# Patient Record
Sex: Female | Born: 1935
Health system: Southern US, Community
[De-identification: ages and names within clinical notes are randomized; demographics above are authoritative.]

## PROBLEM LIST (undated history)

## (undated) DIAGNOSIS — B351 Tinea unguium: Secondary | ICD-10-CM

## (undated) DIAGNOSIS — J45909 Unspecified asthma, uncomplicated: Secondary | ICD-10-CM

## (undated) DIAGNOSIS — T8859XA Other complications of anesthesia, initial encounter: Secondary | ICD-10-CM

## (undated) DIAGNOSIS — M1990 Unspecified osteoarthritis, unspecified site: Secondary | ICD-10-CM

## (undated) DIAGNOSIS — L570 Actinic keratosis: Secondary | ICD-10-CM

## (undated) DIAGNOSIS — Z8619 Personal history of other infectious and parasitic diseases: Secondary | ICD-10-CM

## (undated) DIAGNOSIS — I10 Essential (primary) hypertension: Secondary | ICD-10-CM

## (undated) DIAGNOSIS — R002 Palpitations: Secondary | ICD-10-CM

## (undated) DIAGNOSIS — M810 Age-related osteoporosis without current pathological fracture: Secondary | ICD-10-CM

## (undated) HISTORY — DX: Unspecified asthma, uncomplicated: J45.909

## (undated) HISTORY — DX: Personal history of other infectious and parasitic diseases: Z86.19

## (undated) HISTORY — DX: Actinic keratosis: L57.0

## (undated) HISTORY — DX: Age-related osteoporosis without current pathological fracture: M81.0

## (undated) HISTORY — DX: Tinea unguium: B35.1

## (undated) HISTORY — PX: COLONOSCOPY: SHX174

## (undated) HISTORY — DX: Essential (primary) hypertension: I10

## (undated) HISTORY — DX: Palpitations: R00.2

## (undated) HISTORY — DX: Unspecified osteoarthritis, unspecified site: M19.90

---

## 1999-06-21 ENCOUNTER — Other Ambulatory Visit: Admission: RE | Admit: 1999-06-21 | Discharge: 1999-06-21 | Payer: Self-pay | Admitting: Family Medicine

## 2000-04-12 ENCOUNTER — Encounter: Payer: Self-pay | Admitting: Family Medicine

## 2000-04-12 ENCOUNTER — Encounter: Admission: RE | Admit: 2000-04-12 | Discharge: 2000-04-12 | Payer: Self-pay | Admitting: Family Medicine

## 2001-03-05 ENCOUNTER — Other Ambulatory Visit: Admission: RE | Admit: 2001-03-05 | Discharge: 2001-03-05 | Payer: Self-pay | Admitting: Family Medicine

## 2001-05-30 ENCOUNTER — Encounter: Payer: Self-pay | Admitting: Family Medicine

## 2001-05-30 ENCOUNTER — Encounter: Admission: RE | Admit: 2001-05-30 | Discharge: 2001-05-30 | Payer: Self-pay | Admitting: Family Medicine

## 2002-04-17 ENCOUNTER — Other Ambulatory Visit: Admission: RE | Admit: 2002-04-17 | Discharge: 2002-04-17 | Payer: Self-pay | Admitting: Family Medicine

## 2002-06-04 ENCOUNTER — Encounter: Payer: Self-pay | Admitting: Family Medicine

## 2002-06-04 ENCOUNTER — Encounter: Admission: RE | Admit: 2002-06-04 | Discharge: 2002-06-04 | Payer: Self-pay | Admitting: Family Medicine

## 2002-08-01 HISTORY — PX: BREAST EXCISIONAL BIOPSY: SUR124

## 2003-06-27 ENCOUNTER — Encounter: Admission: RE | Admit: 2003-06-27 | Discharge: 2003-06-27 | Payer: Self-pay | Admitting: Family Medicine

## 2004-06-14 ENCOUNTER — Ambulatory Visit: Payer: Self-pay | Admitting: Family Medicine

## 2004-08-05 ENCOUNTER — Encounter: Admission: RE | Admit: 2004-08-05 | Discharge: 2004-08-05 | Payer: Self-pay | Admitting: Family Medicine

## 2005-06-13 ENCOUNTER — Ambulatory Visit: Payer: Self-pay | Admitting: Family Medicine

## 2005-06-20 ENCOUNTER — Ambulatory Visit: Payer: Self-pay | Admitting: Family Medicine

## 2005-07-01 ENCOUNTER — Encounter: Admission: RE | Admit: 2005-07-01 | Discharge: 2005-07-01 | Payer: Self-pay | Admitting: Family Medicine

## 2005-09-07 ENCOUNTER — Encounter: Admission: RE | Admit: 2005-09-07 | Discharge: 2005-09-07 | Payer: Self-pay | Admitting: Family Medicine

## 2006-06-08 ENCOUNTER — Ambulatory Visit: Payer: Self-pay | Admitting: Family Medicine

## 2006-08-07 ENCOUNTER — Ambulatory Visit: Payer: Self-pay | Admitting: Family Medicine

## 2006-08-07 LAB — CONVERTED CEMR LAB
ALT: 33 units/L (ref 0–40)
AST: 32 units/L (ref 0–37)
Alkaline Phosphatase: 69 units/L (ref 39–117)
BUN: 11 mg/dL (ref 6–23)
Basophils Absolute: 0.1 10*3/uL (ref 0.0–0.1)
Basophils Relative: 1 % (ref 0.0–1.0)
Calcium: 9.4 mg/dL (ref 8.4–10.5)
Chol/HDL Ratio, serum: 3.1
Cholesterol: 231 mg/dL (ref 0–200)
Creatinine, Ser: 0.9 mg/dL (ref 0.4–1.2)
Eosinophil percent: 4 % (ref 0.0–5.0)
Glucose, Bld: 98 mg/dL (ref 70–99)
HCT: 41.7 % (ref 36.0–46.0)
HDL: 73.6 mg/dL (ref >39.0)
Hemoglobin: 13.9 g/dL (ref 12.0–15.0)
LDL DIRECT: 116.7 mg/dL
Lymphocytes Relative: 38.6 % (ref 12.0–46.0)
MCHC: 33.3 g/dL (ref 30.0–36.0)
MCV: 93.9 fL (ref 78.0–100.0)
Monocytes Absolute: 0.5 10*3/uL (ref 0.2–0.7)
Monocytes Relative: 8.7 % (ref 3.0–11.0)
Neutro Abs: 2.7 10*3/uL (ref 1.4–7.7)
Neutrophils Relative %: 47.7 % (ref 43.0–77.0)
Platelets: 245 10*3/uL (ref 150–400)
Potassium: 4.1 meq/L (ref 3.5–5.1)
RBC: 4.44 M/uL (ref 3.87–5.11)
RDW: 12.4 % (ref 11.5–14.6)
Sodium: 143 meq/L (ref 135–145)
TSH: 4.28 microintl units/mL (ref 0.35–5.50)
Triglyceride fasting, serum: 136 mg/dL (ref 0–149)
VLDL: 27 mg/dL (ref 0–40)
WBC: 5.7 10*3/uL (ref 4.5–10.5)

## 2006-08-14 ENCOUNTER — Ambulatory Visit: Payer: Self-pay | Admitting: Internal Medicine

## 2006-09-08 ENCOUNTER — Encounter: Admission: RE | Admit: 2006-09-08 | Discharge: 2006-09-08 | Payer: Self-pay | Admitting: Family Medicine

## 2007-05-28 DIAGNOSIS — I10 Essential (primary) hypertension: Secondary | ICD-10-CM | POA: Insufficient documentation

## 2007-05-28 DIAGNOSIS — M810 Age-related osteoporosis without current pathological fracture: Secondary | ICD-10-CM | POA: Insufficient documentation

## 2007-07-06 ENCOUNTER — Ambulatory Visit: Payer: Self-pay | Admitting: Family Medicine

## 2007-09-11 ENCOUNTER — Encounter: Admission: RE | Admit: 2007-09-11 | Discharge: 2007-09-11 | Payer: Self-pay | Admitting: Family Medicine

## 2007-09-17 ENCOUNTER — Ambulatory Visit: Payer: Self-pay | Admitting: Family Medicine

## 2007-09-17 DIAGNOSIS — R002 Palpitations: Secondary | ICD-10-CM | POA: Insufficient documentation

## 2007-09-17 LAB — CONVERTED CEMR LAB
ALT: 24 units/L (ref 0–35)
AST: 26 units/L (ref 0–37)
Albumin: 3.9 g/dL (ref 3.5–5.2)
Alkaline Phosphatase: 60 units/L (ref 39–117)
BUN: 15 mg/dL (ref 6–23)
Basophils Absolute: 0.1 10*3/uL (ref 0.0–0.1)
Basophils Relative: 0.9 % (ref 0.0–1.0)
Bilirubin Urine: NEGATIVE
Bilirubin, Direct: 0.1 mg/dL (ref 0.0–0.3)
Blood in Urine, dipstick: NEGATIVE
CO2: 29 meq/L (ref 19–32)
Calcium: 9.8 mg/dL (ref 8.4–10.5)
Chloride: 109 meq/L (ref 96–112)
Cholesterol: 210 mg/dL (ref 0–200)
Creatinine, Ser: 0.9 mg/dL (ref 0.4–1.2)
Direct LDL: 107.4 mg/dL
Eosinophils Absolute: 0.2 10*3/uL (ref 0.0–0.6)
Eosinophils Relative: 3.2 % (ref 0.0–5.0)
GFR calc Af Amer: 78 mL/min
GFR calc non Af Amer: 65 mL/min
Glucose, Bld: 105 mg/dL — ABNORMAL HIGH (ref 70–99)
Glucose, Urine, Semiquant: NEGATIVE
HCT: 40.6 % (ref 36.0–46.0)
HDL: 73.6 mg/dL (ref >39.0)
Hemoglobin: 13.5 g/dL (ref 12.0–15.0)
Ketones, urine, test strip: NEGATIVE
Lymphocytes Relative: 34.1 % (ref 12.0–46.0)
MCHC: 33.3 g/dL (ref 30.0–36.0)
MCV: 92.6 fL (ref 78.0–100.0)
Monocytes Absolute: 0.5 10*3/uL (ref 0.2–0.7)
Monocytes Relative: 9.1 % (ref 3.0–11.0)
Neutro Abs: 3 10*3/uL (ref 1.4–7.7)
Neutrophils Relative %: 52.7 % (ref 43.0–77.0)
Nitrite: NEGATIVE
Platelets: 191 10*3/uL (ref 150–400)
Potassium: 4.6 meq/L (ref 3.5–5.1)
Protein, U semiquant: NEGATIVE
RBC: 4.38 M/uL (ref 3.87–5.11)
RDW: 12.2 % (ref 11.5–14.6)
Sodium: 146 meq/L — ABNORMAL HIGH (ref 135–145)
Specific Gravity, Urine: 1.02
TSH: 3.48 microintl units/mL (ref 0.35–5.50)
Total Bilirubin: 1.3 mg/dL — ABNORMAL HIGH (ref 0.3–1.2)
Total CHOL/HDL Ratio: 2.9
Total Protein: 6.8 g/dL (ref 6.0–8.3)
Triglycerides: 103 mg/dL (ref 0–149)
Urobilinogen, UA: 0.2
VLDL: 21 mg/dL (ref 0–40)
WBC Urine, dipstick: NEGATIVE
WBC: 5.8 10*3/uL (ref 4.5–10.5)
pH: 5.5

## 2008-05-21 ENCOUNTER — Ambulatory Visit: Payer: Self-pay | Admitting: Family Medicine

## 2008-09-10 ENCOUNTER — Ambulatory Visit: Payer: Self-pay | Admitting: Internal Medicine

## 2008-09-22 ENCOUNTER — Ambulatory Visit: Payer: Self-pay | Admitting: Family Medicine

## 2008-09-23 LAB — CONVERTED CEMR LAB
ALT: 23 units/L (ref 0–35)
AST: 30 units/L (ref 0–37)
Albumin: 3.8 g/dL (ref 3.5–5.2)
Alkaline Phosphatase: 67 units/L (ref 39–117)
BUN: 17 mg/dL (ref 6–23)
Basophils Absolute: 0 10*3/uL (ref 0.0–0.1)
Basophils Relative: 0.8 % (ref 0.0–3.0)
Bilirubin, Direct: 0.2 mg/dL (ref 0.0–0.3)
CO2: 28 meq/L (ref 19–32)
Calcium: 9 mg/dL (ref 8.4–10.5)
Chloride: 106 meq/L (ref 96–112)
Creatinine, Ser: 0.8 mg/dL (ref 0.4–1.2)
Eosinophils Absolute: 0.2 10*3/uL (ref 0.0–0.7)
Eosinophils Relative: 3.4 % (ref 0.0–5.0)
GFR calc Af Amer: 89 mL/min
GFR calc non Af Amer: 74 mL/min
Glucose, Bld: 100 mg/dL — ABNORMAL HIGH (ref 70–99)
HCT: 39.1 % (ref 36.0–46.0)
Hemoglobin: 13.4 g/dL (ref 12.0–15.0)
Lymphocytes Relative: 37.3 % (ref 12.0–46.0)
MCHC: 34.2 g/dL (ref 30.0–36.0)
MCV: 92.9 fL (ref 78.0–100.0)
Monocytes Absolute: 0.5 10*3/uL (ref 0.1–1.0)
Monocytes Relative: 9.7 % (ref 3.0–12.0)
Neutro Abs: 2.6 10*3/uL (ref 1.4–7.7)
Neutrophils Relative %: 48.8 % (ref 43.0–77.0)
Platelets: 217 10*3/uL (ref 150–400)
Potassium: 4.3 meq/L (ref 3.5–5.1)
RBC: 4.21 M/uL (ref 3.87–5.11)
RDW: 12.3 % (ref 11.5–14.6)
Sodium: 143 meq/L (ref 135–145)
TSH: 3.67 microintl units/mL (ref 0.35–5.50)
Total Bilirubin: 1.2 mg/dL (ref 0.3–1.2)
Total Protein: 6.9 g/dL (ref 6.0–8.3)
WBC: 5.3 10*3/uL (ref 4.5–10.5)

## 2008-09-25 ENCOUNTER — Encounter: Payer: Self-pay | Admitting: Family Medicine

## 2008-09-25 ENCOUNTER — Telehealth: Payer: Self-pay | Admitting: Family Medicine

## 2008-09-25 ENCOUNTER — Ambulatory Visit: Payer: Self-pay | Admitting: Family Medicine

## 2008-09-30 ENCOUNTER — Encounter: Admission: RE | Admit: 2008-09-30 | Discharge: 2008-09-30 | Payer: Self-pay | Admitting: Family Medicine

## 2008-09-30 DIAGNOSIS — B351 Tinea unguium: Secondary | ICD-10-CM | POA: Insufficient documentation

## 2008-10-20 LAB — CONVERTED CEMR LAB
Bilirubin Urine: NEGATIVE
Blood in Urine, dipstick: NEGATIVE
Glucose, Urine, Semiquant: NEGATIVE
Ketones, urine, test strip: NEGATIVE
Nitrite: NEGATIVE
Protein, U semiquant: NEGATIVE
Specific Gravity, Urine: 1.02
Urobilinogen, UA: 0.2
pH: 5.5

## 2009-08-04 ENCOUNTER — Telehealth: Payer: Self-pay | Admitting: *Deleted

## 2009-09-23 ENCOUNTER — Ambulatory Visit: Payer: Self-pay | Admitting: Family Medicine

## 2009-09-23 LAB — CONVERTED CEMR LAB
Bilirubin Urine: NEGATIVE
Blood in Urine, dipstick: NEGATIVE
Glucose, Urine, Semiquant: NEGATIVE
Ketones, urine, test strip: NEGATIVE
Nitrite: NEGATIVE
Protein, U semiquant: NEGATIVE
Specific Gravity, Urine: 1.02
Urobilinogen, UA: 0.2
pH: 5

## 2009-09-28 LAB — CONVERTED CEMR LAB
ALT: 32 units/L (ref 0–35)
AST: 32 units/L (ref 0–37)
Albumin: 3.9 g/dL (ref 3.5–5.2)
Alkaline Phosphatase: 65 units/L (ref 39–117)
BUN: 15 mg/dL (ref 6–23)
Basophils Absolute: 0.1 10*3/uL (ref 0.0–0.1)
Basophils Relative: 1.1 % (ref 0.0–3.0)
Bilirubin, Direct: 0.2 mg/dL (ref 0.0–0.3)
CO2: 30 meq/L (ref 19–32)
Calcium: 9.3 mg/dL (ref 8.4–10.5)
Chloride: 109 meq/L (ref 96–112)
Creatinine, Ser: 0.9 mg/dL (ref 0.4–1.2)
Eosinophils Absolute: 0.3 10*3/uL (ref 0.0–0.7)
Eosinophils Relative: 4.1 % (ref 0.0–5.0)
GFR calc non Af Amer: 64.26 mL/min (ref >60)
Glucose, Bld: 87 mg/dL (ref 70–99)
HCT: 39.1 % (ref 36.0–46.0)
Hemoglobin: 13.1 g/dL (ref 12.0–15.0)
Lymphocytes Relative: 32.4 % (ref 12.0–46.0)
Lymphs Abs: 2 10*3/uL (ref 0.7–4.0)
MCHC: 33.4 g/dL (ref 30.0–36.0)
MCV: 95.8 fL (ref 78.0–100.0)
Monocytes Absolute: 0.6 10*3/uL (ref 0.1–1.0)
Monocytes Relative: 9.6 % (ref 3.0–12.0)
Neutro Abs: 3.1 10*3/uL (ref 1.4–7.7)
Neutrophils Relative %: 52.8 % (ref 43.0–77.0)
Platelets: 197 10*3/uL (ref 150.0–400.0)
Potassium: 4.4 meq/L (ref 3.5–5.1)
RBC: 4.08 M/uL (ref 3.87–5.11)
RDW: 12.2 % (ref 11.5–14.6)
Sodium: 145 meq/L (ref 135–145)
TSH: 3.57 microintl units/mL (ref 0.35–5.50)
Total Bilirubin: 0.9 mg/dL (ref 0.3–1.2)
Total Protein: 7.1 g/dL (ref 6.0–8.3)
WBC: 6.1 10*3/uL (ref 4.5–10.5)

## 2009-10-01 ENCOUNTER — Encounter: Admission: RE | Admit: 2009-10-01 | Discharge: 2009-10-01 | Payer: Self-pay | Admitting: Family Medicine

## 2009-10-01 ENCOUNTER — Ambulatory Visit: Payer: Self-pay | Admitting: Family Medicine

## 2009-10-02 DIAGNOSIS — J45991 Cough variant asthma: Secondary | ICD-10-CM | POA: Insufficient documentation

## 2009-10-05 ENCOUNTER — Ambulatory Visit: Payer: Self-pay | Admitting: Family Medicine

## 2009-10-07 DIAGNOSIS — L570 Actinic keratosis: Secondary | ICD-10-CM | POA: Insufficient documentation

## 2009-10-22 ENCOUNTER — Telehealth: Payer: Self-pay | Admitting: Family Medicine

## 2010-05-04 ENCOUNTER — Ambulatory Visit: Payer: Self-pay | Admitting: Family Medicine

## 2010-09-02 NOTE — Assessment & Plan Note (Signed)
Summary: toenail removal - rv  Run at a melanoma, and a benign tumor, but it was not known.  No  Procedure Note Last Tetanus: Historical (08/01/2005)  Nail Treatment: Indication: black spot  Procedure # 1: nail bed removal    Region: l grt toenail    Technique: #15 blade w/scissors & forceps    Anesthesia: 1% lidocaine w/o epinephrine  Cleaned and prepped with: betadine Wound dressing: neosporin Additional Instructions: elevation and ice tonight.  Remove the dressing tomorrow.  Clean it with warm water and peroxide to get off the dried blood.  Apply antibiotic ointment and Band-Aid.   Chief Complaint:  black lesion and under left great toe nail.  History of Present Illness: Taylor Blankenship is a 77 year old female, retired Engineer, civil (consulting), who comes in today for evaluation of a black lesion under her left great toenail.  She first noticed this about a year ago.  Over the past 12 months.  Its gotten darker and bigger.  She was advised to return for removal and she is here today.    Current Allergies: ! AMOXICILLIN ! KEFLEX  Past Medical History:    Reviewed history from 05/28/2007 and no changes required:       PMS       DJD R Knee       Hypertension       Osteoporosis       Shingles   Social History:    Reviewed history from 05/28/2007 and no changes required:       Retired Charity fundraiser        Married       Never Smoked       Alcohol use-yes       Regular exercise-yes     Physical Exam  General:     Well-developed,well-nourished,in no acute distress; alert,appropriate and cooperative throughout examination    Complete Medication List: 1)  Atenolol 25 Mg Tabs (Atenolol) .... Take 1 tablet by mouth every morning 2)  Adult Aspirin Low Strength 81 Mg Tbdp (Aspirin) 3)  Multivitamins Caps (Multiple vitamin) .... One by mouth daily 4)  Calcium 600 1500 Mg Tabs (Calcium carbonate) .... As directed 5)  Fish Oil 300 Mg Caps (Omega-3 fatty acids) .... As directed. 6)  D 1000 Plus Tabs  (Fa-cyanocobalamin-b6-d-ca) .... Once daily

## 2010-09-02 NOTE — Assessment & Plan Note (Signed)
Summary: emp-will fast//ccm/pt rsc from bmp/per rachel/cjr   Vital Signs:  Patient profile:   77 year old female Height:      63.5 inches Weight:      184 pounds BMI:     32.20 Temp:     98.0 degrees F oral BP sitting:   134 / 90  (left arm) Cuff size:   regular  Vitals Entered By: Kern Reap CMA [name]) (September 23, 2009 10:29 AM)  Reason for Visit cpx  History of Present Illness: Wendell is a 77 year old, married female, nonsmoker, who comes in today for evaluation of hypertension.  She takes atenolol 25 mg daily BP 134/90.  Jomarie Longs takes an aspirin tablet daily, calcium, vitamin D.  She gets routine eye care, dental care, does not do BSE monthly, but does get annual mammography.  Colonoscopy normal in GI.  Tetanus 2007 next 2010 solution not declined.  Review of systems otherwise negative except for profound hearing loss.  She forgot her hearing aids today also, as lesion right anterior thigh that won't heal  Allergies: 1)  ! Amoxicillin 2)  ! Keflex  Past History:  Past medical, surgical, family and social histories (including risk factors) reviewed, and no changes noted (except as noted below).  Past Medical History: Reviewed history from 05/28/2007 and no changes required. PMS DJD R Knee Hypertension Osteoporosis Shingles  Past Surgical History: Reviewed history from 05/28/2007 and no changes required. Colonoscopy CB x2  Family History: Reviewed history from 05/28/2007 and no changes required. Family History of Arthritis Family History of CAD Female 1st degree relative <50 Family History of Colon CA 1st degree relative <60 Family History Diabetes 1st degree relative Family History of Endocrine disorder Family History of Respiratory disease  Social History: Reviewed history from 09/25/2008 and no changes required. Retired Charity fundraiser  Married Never Smoked Alcohol use-yes Regular exercise-yes  Review of Systems      See HPI  Physical Exam  General:   Well-developed,well-nourished,in no acute distress; alert,appropriate and cooperative throughout examination Head:  Normocephalic and atraumatic without obvious abnormalities. No apparent alopecia or balding. Eyes:  No corneal or conjunctival inflammation noted. EOMI. Perrla. Funduscopic exam benign, without hemorrhages, exudates or papilledema. Vision grossly normal. Ears:  External ear exam shows no significant lesions or deformities.  Otoscopic examination reveals clear canals, tympanic membranes are intact bilaterally without bulging, retraction, inflammation or discharge. Hearing is grossly normal bilaterally. Nose:  External nasal examination shows no deformity or inflammation. Nasal mucosa are pink and moist without lesions or exudates. Mouth:  Oral mucosa and oropharynx without lesions or exudates.  Teeth in good repair. Neck:  No deformities, masses, or tenderness noted. Chest Wall:  No deformities, masses, or tenderness noted. Breasts:  No mass, nodules, thickening, tenderness, bulging, retraction, inflamation, nipple discharge or skin changes noted.   Lungs:  Normal respiratory effort, chest expands symmetrically. Lungs are clear to auscultation, no crackles or wheezes. Heart:  Normal rate and regular rhythm. S1 and S2 normal without gallop, murmur, click, rub or other extra sounds. Abdomen:  Bowel sounds positive,abdomen soft and non-tender without masses, organomegaly or hernias noted. Rectal:  No external abnormalities noted. Normal sphincter tone. No rectal masses or tenderness. Msk:  No deformity or scoliosis noted of thoracic or lumbar spine.   Pulses:  R and L carotid,radial,femoral,dorsalis pedis and posterior tibial pulses are full and equal bilaterally Extremities:  No clubbing, cyanosis, edema, or deformity noted with normal full range of motion of all joints.   Neurologic:  No  cranial nerve deficits noted. Station and gait are normal. Plantar reflexes are down-going  bilaterally. DTRs are symmetrical throughout. Sensory, motor and coordinative functions appear intact. Skin:  Intact without suspicious lesions or rashes...Marland Kitchenexcept for nonhealing ulcerated lesion, right anterior thigh.  Advise return ASAP for removal Cervical Nodes:  No lymphadenopathy noted Axillary Nodes:  No palpable lymphadenopathy Inguinal Nodes:  No significant adenopathy Psych:  Cognition and judgment appear intact. Alert and cooperative with normal attention span and concentration. No apparent delusions, illusions, hallucinations   Impression & Recommendations:  Problem # 1:  PALPITATIONS, RECURRENT (ICD-785.1) Assessment Improved  Her updated medication list for this problem includes:    Atenolol 25 Mg Tabs (Atenolol) .Marland Kitchen... Take 1 tablet by mouth every morning  Orders: Venipuncture (16109) TLB-BMP (Basic Metabolic Panel-BMET) (80048-METABOL) TLB-CBC Platelet - w/Differential (85025-CBCD) TLB-Hepatic/Liver Function Pnl (80076-HEPATIC) TLB-TSH (Thyroid Stimulating Hormone) (60454-UJW) Prescription Created Electronically [phone])  Problem # 2:  HYPERTENSION (ICD-401.9) Assessment: Improved  Her updated medication list for this problem includes:    Atenolol 25 Mg Tabs (Atenolol) .Marland Kitchen... Take 1 tablet by mouth every morning  Orders: Venipuncture (78295) TLB-BMP (Basic Metabolic Panel-BMET) (80048-METABOL) TLB-CBC Platelet - w/Differential (85025-CBCD) TLB-Hepatic/Liver Function Pnl (80076-HEPATIC) TLB-TSH (Thyroid Stimulating Hormone) (62130-QMV) Prescription Created Electronically [phone]) UA Dipstick w/o Micro (automated)  (81003)  Complete Medication List: 1)  Atenolol 25 Mg Tabs (Atenolol) .... Take 1 tablet by mouth every morning 2)  Adult Aspirin Low Strength 81 Mg Tbdp (Aspirin) 3)  Multivitamins Caps (Multiple vitamin) .... One by mouth daily 4)  Calcium 600 1500 Mg Tabs (Calcium carbonate) .... As directed 5)  Fish Oil 300 Mg Caps (Omega-3 fatty acids) .... As  directed. 6)  D 1000 Plus Tabs (Fa-cyanocobalamin-b6-d-ca) .... Once daily  Patient Instructions: 1)  return sometime in the next week for mole removal 2)  Please schedule a follow-up appointment in 1 year. 3)  It is important that you exercise regularly at least 20 minutes 5 times a week. If you develop chest pain, have severe difficulty breathing, or feel very tired , stop exercising immediately and seek medical attention. 4)  Schedule your mammogram. 5)  Take calcium +Vitamin D daily. 6)  Take an Aspirin every day. Prescriptions: ATENOLOL 25 MG  TABS (ATENOLOL) Take 1 tablet by mouth every morning  #100 x 4   Entered and Authorized by:   Roderick Pee MD   Signed by:   Roderick Pee MD on 09/23/2009   Method used:   Print then Give to Patient   RxID:   [id number] ATENOLOL 25 MG  TABS (ATENOLOL) Take 1 tablet by mouth every morning  #100 x 4   Entered and Authorized by:   Roderick Pee MD   Signed by:   Roderick Pee MD on 09/23/2009   Method used:   Electronically to        [facility]  [phone]* (retail)       [address] [place], [place]  [address]       Ph: [phone] or [phone]       Fax: [phone]   RxID:   [id number]     Laboratory Results   Urine Tests    Routine Urinalysis   Color: yellow Appearance: Clear Glucose: negative   (Normal Range: Negative) Bilirubin: negative   (Normal Range: Negative) Ketone: negative   (Normal Range: Negative) Spec. Gravity: 1.020   (Normal Range: 1.003-1.035) Blood: negative   (Normal Range: Negative)  pH: 5.0   (Normal Range: 5.0-8.0) Protein: negative   (Normal Range: Negative) Urobilinogen: 0.2   (Normal Range: 0-1) Nitrite: negative   (Normal Range: Negative) Leukocyte Esterace: trace   (Normal Range: Negative)    Comments: Rita Ohara  September 23, 2009 11:47 AM

## 2010-09-02 NOTE — Assessment & Plan Note (Signed)
Summary: FLU SHOT W/ANGELA/CCM  Nurse Visit    Prior Medications: ATENOLOL 25 MG  TABS (ATENOLOL)  ADULT ASPIRIN LOW STRENGTH 81 MG  TBDP (ASPIRIN)  Current Allergies: ! AMOXICILLIN ! KEFLEX   Influenza Vaccine    Vaccine Type: Fluvax MCR    Given by: Arcola Jansky, RN  Flu Vaccine Consent Questions    Do you have a history of severe allergic reactions to this vaccine? no    Any prior history of allergic reactions to egg and/or gelatin? no    Do you have a sensitivity to the preservative Thimersol? no    Do you have a past history of Guillan-Barre Syndrome? no    Do you currently have an acute febrile illness? no    Have you ever had a severe reaction to latex? no    Vaccine information given and explained to patient? yes    Are you currently pregnant? no   Impression & Recommendations:  Problem # 1:  Preventive Health Care (ICD-V70.0) lot [id number], EXP 30 jun 09, sanofi pasteur left deltoid IM, 0.5 cc.   Complete Medication List: 1)  Atenolol 25 Mg Tabs (Atenolol) 2)  Adult Aspirin Low Strength 81 Mg Tbdp (Aspirin)  Other Orders: Influenza Vaccine MCR (47829)   Orders Added: 1)  Influenza Vaccine MCR [00025]    ]

## 2010-09-02 NOTE — Progress Notes (Signed)
Summary: labs  Phone Note Call from Patient   Caller: Patient Details for Reason: LABS Summary of Call: pt did not receive a lipid or UA should patient return for this? Initial call taken by: Kern Reap CMA,  September 25, 2008 5:33 PM  Follow-up for Phone Call        called left message that she does not require a lipid panel since her other lipids of all been normal.  But she did need to get a UA come in in 3 weeks bringing morning.  Urine specimen did that for free Follow-up by: Roderick Pee MD,  September 30, 2008 6:02 PM

## 2010-09-02 NOTE — Progress Notes (Signed)
Summary: biopsy report  Phone Note Call from Patient Call back at Home Phone [phone]   Reason for Call: Lab or Test Results Summary of Call: Wants to hear biopsy report from a couple weeks ago.  Next week will be fine. Initial call taken by: Rudy Jew, RN,  October 22, 2009 11:26 AM  Follow-up for Phone Call        please call.......... lesion actinic keratosis........Marland Kitchen benign Follow-up by: Roderick Pee MD,  October 26, 2009 8:40 AM  Additional Follow-up for Phone Call Additional follow up Details #1::        Phone Call Completed Additional Follow-up by: Kern Reap CMA [name]),  October 26, 2009 11:07 AM

## 2010-09-02 NOTE — Assessment & Plan Note (Signed)
Summary: EMP/WILL FAST/CCM   Vital Signs:  Patient Profile:   77 Years Old Female Height:     64 inches Weight:      180 pounds Temp:     98.2 degrees F oral Pulse rate:   78 / minute Pulse rhythm:   irregular Resp:     12 per minute BP sitting:   120 / 76  Vitals Entered By: Lynann Beaver CMA (September 17, 2007 10:29 AM)                 Chief Complaint:  cpx.  History of Present Illness: Taylor Blankenship is a 77 year old, married female, who comes in today for evaluation of hypertension, osteoporosis, postmenopausal, dry vagina, and degenerative joint disease, most prominent in her right knee. she also complains of skipping of her heart rate.  She's had it for a couple years now.  She takes atenolol 25 mg one half tablet daily.  She does not drink any caffeine.  She is noticed in the evening, when she's resting.  It doesn't bother when she walks.  She has had chest pain, shortness of breath, lightheadedness, et Karie Soda.  Her previous EKGs have all been normal, as was today.  The osteoporosis is treated with calcium, vitamin D, and exercise.  She declines Fosamax.  The vaginal dryness is treated with hormonal cream however, she didn't use it.  The knee pain is treated with exercise and Motrin.  She is doing well and has no major complaints.     Current Allergies: ! AMOXICILLIN ! KEFLEX  Past Medical History:    Reviewed history from 05/28/2007 and no changes required:       PMS       DJD R Knee       Hypertension       Osteoporosis       Shingles   Family History:    Reviewed history from 05/28/2007 and no changes required:       Family History of Arthritis       Family History of CAD Female 1st degree relative <50       Family History of Colon CA 1st degree relative <60       Family History Diabetes 1st degree relative       Family History of Endocrine disorder       Family History of Respiratory disease  Social History:    Reviewed history from 05/28/2007 and no changes  required:       Retired       Married       Never Smoked       Alcohol use-yes       Regular exercise-yes    Review of Systems      See HPI   Physical Exam  General:     Well-developed,well-nourished,in no acute distress; alert,appropriate and cooperative throughout examination Head:     Normocephalic and atraumatic without obvious abnormalities. No apparent alopecia or balding. Eyes:     No corneal or conjunctival inflammation noted. EOMI. Perrla. Funduscopic exam benign, without hemorrhages, exudates or papilledema. Vision grossly normal. Ears:     External ear exam shows no significant lesions or deformities.  Otoscopic examination reveals clear canals, tympanic membranes are intact bilaterally without bulging, retraction, inflammation or discharge. Hearing is grossly normal bilaterally. Nose:     External nasal examination shows no deformity or inflammation. Nasal mucosa are pink and moist without lesions or exudates. Mouth:     Oral mucosa and oropharynx  without lesions or exudates.  Teeth in good repair. Neck:     No deformities, masses, or tenderness noted. Chest Wall:     No deformities, masses, or tenderness noted. Breasts:     No mass, nodules, thickening, tenderness, bulging, retraction, inflamation, nipple discharge or skin changes noted.   Lungs:     Normal respiratory effort, chest expands symmetrically. Lungs are clear to auscultation, no crackles or wheezes. Heart:     Normal rate and regular rhythm. S1 and S2 normal without gallop, murmur, click, rub or other extra sounds. Abdomen:     Bowel sounds positive,abdomen soft and non-tender without masses, organomegaly or hernias noted. Rectal:     No external abnormalities noted. Normal sphincter tone. No rectal masses or tenderness. Genitalia:     Pelvic Exam:        External: normal female genitalia without lesions or masses        Vagina: normal without lesions or masses        Cervix: normal without  lesions or masses        Adnexa: normal bimanual exam without masses or fullness        Uterus: normal by palpation        Pap smear: performed Msk:     No deformity or scoliosis noted of thoracic or lumbar spine.   Pulses:     R and L carotid,radial,femoral,dorsalis pedis and posterior tibial pulses are full and equal bilaterally Extremities:     No clubbing, cyanosis, edema, or deformity noted with normal full range of motion of all joints.   Neurologic:     No cranial nerve deficits noted. Station and gait are normal. Plantar reflexes are down-going bilaterally. DTRs are symmetrical throughout. Sensory, motor and coordinative functions appear intact. Skin:     Intact without suspicious lesions or rashes Cervical Nodes:     No lymphadenopathy noted Axillary Nodes:     No palpable lymphadenopathy Inguinal Nodes:     No significant adenopathy Psych:     Cognition and judgment appear intact. Alert and cooperative with normal attention span and concentration. No apparent delusions, illusions, hallucinations    Impression & Recommendations:  Problem # 1:  PHYSICAL EXAMINATION (ICD-V70.0) Assessment: Unchanged  Orders: EKG w/ Interpretation (93000) Venipuncture (13244) TLB-Lipid Panel (80061-LIPID) TLB-BMP (Basic Metabolic Panel-BMET) (80048-METABOL) TLB-TSH (Thyroid Stimulating Hormone) (84443-TSH) TLB-CBC Platelet - w/Differential (85025-CBCD) TLB-Hepatic/Liver Function Pnl (80076-HEPATIC)   Problem # 2:  HYPERTENSION (ICD-401.9) Assessment: Unchanged  Her updated medication list for this problem includes:    Atenolol 25 Mg Tabs (Atenolol) .Marland Kitchen... Take 1 tablet by mouth every morning  Orders: EKG w/ Interpretation (93000) UA Dipstick w/o Micro (81002) Venipuncture (01027) TLB-Lipid Panel (80061-LIPID) TLB-BMP (Basic Metabolic Panel-BMET) (80048-METABOL) TLB-TSH (Thyroid Stimulating Hormone) (84443-TSH) TLB-CBC Platelet - w/Differential (85025-CBCD) TLB-Hepatic/Liver  Function Pnl (80076-HEPATIC)   Problem # 3:  PALPITATIONS, RECURRENT (ICD-785.1) Assessment: Deteriorated  Her updated medication list for this problem includes:    Atenolol 25 Mg Tabs (Atenolol) .Marland Kitchen... Take 1 tablet by mouth every morning  Orders: Venipuncture (25366) TLB-Lipid Panel (80061-LIPID) TLB-BMP (Basic Metabolic Panel-BMET) (80048-METABOL) TLB-TSH (Thyroid Stimulating Hormone) (84443-TSH) TLB-CBC Platelet - w/Differential (85025-CBCD) TLB-Hepatic/Liver Function Pnl (80076-HEPATIC)   Complete Medication List: 1)  Atenolol 25 Mg Tabs (Atenolol) .... Take 1 tablet by mouth every morning 2)  Adult Aspirin Low Strength 81 Mg Tbdp (Aspirin) 3)  Multivitamins Caps (Multiple vitamin) .... One by mouth daily 4)  Calcium 600 1500 Mg Tabs (Calcium carbonate) .... As directed 5)  Fish Oil 300 Mg Caps (Omega-3 fatty acids) .... As directed.   Patient Instructions: 1)  increase the atenolol to 25 mg daily.  Continue calcium, vitamin D, and walking.  Return in one year for reevaluation    Prescriptions: ATENOLOL 25 MG  TABS (ATENOLOL) Take 1 tablet by mouth every morning  #100 x 4   Entered and Authorized by:   Roderick Pee MD   Signed by:   Roderick Pee MD on 09/17/2007   Method used:   Print then Give to Patient   RxID:   [id number]  ] Laboratory Results   Urine Tests   Date/Time Reported: September 17, 2007 12:43 PM   Routine Urinalysis   Color: yellow Appearance: Clear Glucose: negative   (Normal Range: Negative) Bilirubin: negative   (Normal Range: Negative) Ketone: negative   (Normal Range: Negative) Spec. Gravity: 1.020   (Normal Range: 1.003-1.035) Blood: negative   (Normal Range: Negative) pH: 5.5   (Normal Range: 5.0-8.0) Protein: negative   (Normal Range: Negative) Urobilinogen: 0.2   (Normal Range: 0-1) Nitrite: negative   (Normal Range: Negative) Leukocyte Esterace: negative   (Normal Range: Negative)    Comments:  ..................................................................Marland KitchenWynona Canes, CMA  September 17, 2007 12:43 PM

## 2010-09-02 NOTE — Assessment & Plan Note (Signed)
Summary: emp patient fasting/mhf RESCHEDULE WITH PATIENT/MHF   Vital Signs:  Patient Profile:   77 Years Old Female Height:     63.5 inches Weight:      184 pounds Temp:     98.3 degrees F oral BP sitting:   120 / 78  (left arm)  Vitals Entered By: Kern Reap CMA (September 22, 2008 10:40 AM)                 Chief Complaint:  cpx.  History of Present Illness:  Taylor Blankenship is a 77 year old female, who comes in today for evaluation of hypertension. She takes the atenolol 25 mg one half tablet daily.  BP 120/78, pulse 60 and regular.  She also takes aspirin, calcium, and vitamin D.  She goes to the gym daily for 45 minutes.  Seasonal flu 2009, tetanus, 2007, Pneumovax 2005.  We will give her a Pneumovax booster today.  Also offered shingles and H1 N1, she declined.  A new problem is discoloration of her left great toenail.  She noticed this about a year ago.  It was a black dot.  Over the past years gotten bigger.  We discussed the possible etiologies.  The worse thing could be a melanoma.  There.  Therefore, been asked to come back next week to get removed under local    Updated Prior Medication List: ATENOLOL 25 MG  TABS (ATENOLOL) Take 1 tablet by mouth every morning ADULT ASPIRIN LOW STRENGTH 81 MG  TBDP (ASPIRIN)  MULTIVITAMINS   CAPS (MULTIPLE VITAMIN) one by mouth daily CALCIUM 600 1500 MG  TABS (CALCIUM CARBONATE) as directed FISH OIL 300 MG  CAPS (OMEGA-3 FATTY ACIDS) as directed. D 1000 PLUS  TABS (FA-CYANOCOBALAMIN-B6-D-CA) once daily  Current Allergies (reviewed today): ! AMOXICILLIN ! KEFLEX  Past Medical History:    Reviewed history from 05/28/2007 and no changes required:       PMS       DJD R Knee       Hypertension       Osteoporosis       Shingles   Family History:    Reviewed history from 05/28/2007 and no changes required:       Family History of Arthritis       Family History of CAD Female 1st degree relative <50       Family History of Colon CA  1st degree relative <60       Family History Diabetes 1st degree relative       Family History of Endocrine disorder       Family History of Respiratory disease  Social History:    Reviewed history from 05/28/2007 and no changes required:       Retired       Married       Never Smoked       Alcohol use-yes       Regular exercise-yes    Review of Systems      See HPI   Physical Exam  General:     Well-developed,well-nourished,in no acute distress; alert,appropriate and cooperative throughout examination Head:     Normocephalic and atraumatic without obvious abnormalities. No apparent alopecia or balding. Eyes:     No corneal or conjunctival inflammation noted. EOMI. Perrla. Funduscopic exam benign, without hemorrhages, exudates or papilledema. Vision grossly normal. Ears:     External ear exam shows no significant lesions or deformities.  Otoscopic examination reveals clear canals, tympanic membranes are intact bilaterally without bulging,  retraction, inflammation or discharge. Hearing is grossly normal bilaterally. Nose:     External nasal examination shows no deformity or inflammation. Nasal mucosa are pink and moist without lesions or exudates. Mouth:     Oral mucosa and oropharynx without lesions or exudates.  Teeth in good repair. Neck:     No deformities, masses, or tenderness noted. Chest Wall:     No deformities, masses, or tenderness noted. Breasts:     No mass, nodules, thickening, tenderness, bulging, retraction, inflamation, nipple discharge or skin changes noted.   Lungs:     Normal respiratory effort, chest expands symmetrically. Lungs are clear to auscultation, no crackles or wheezes. Heart:     Normal rate and regular rhythm. S1 and S2 normal without gallop, murmur, click, rub or other extra sounds. Abdomen:     Bowel sounds positive,abdomen soft and non-tender without masses, organomegaly or hernias noted. Rectal:     No external abnormalities noted.  Normal sphincter tone. No rectal masses or tenderness.Brown stool, guaiac-positive.  Hemorrhoid is been bothering her recently colonoscopy of 5 normal Genitalia:     Pelvic Exam:        External: normal female genitalia without lesions or masses        Vagina: normal without lesions or masses        Cervix: normal without lesions or masses        Adnexa: normal bimanual exam without masses or fullness        Uterus: normal by palpation        Pap smear: not performed Msk:     No deformity or scoliosis noted of thoracic or lumbar spine.   Pulses:     R and L carotid,radial,femoral,dorsalis pedis and posterior tibial pulses are full and equal bilaterally Extremities:     No clubbing, cyanosis, edema, or deformity noted with normal full range of motion of all joints.   Neurologic:     No cranial nerve deficits noted. Station and gait are normal. Plantar reflexes are down-going bilaterally. DTRs are symmetrical throughout. Sensory, motor and coordinative functions appear intact. Skin:     multiple seborrheic keratosis Cervical Nodes:     No lymphadenopathy noted Axillary Nodes:     No palpable lymphadenopathy Inguinal Nodes:     No significant adenopathy Psych:     Cognition and judgment appear intact. Alert and cooperative with normal attention span and concentration. No apparent delusions, illusions, hallucinations    Impression & Recommendations:  Problem # 1:  PALPITATIONS, RECURRENT (ICD-785.1) Assessment: Improved  Her updated medication list for this problem includes:    Atenolol 25 Mg Tabs (Atenolol) .Marland Kitchen... Take 1 tablet by mouth every morning  Orders: Venipuncture ([address]) EKG w/ Interpretation (93000) TLB-BMP (Basic Metabolic Panel-BMET) (80048-METABOL) TLB-CBC Platelet - w/Differential (85025-CBCD) TLB-Hepatic/Liver Function Pnl (80076-HEPATIC) TLB-TSH (Thyroid Stimulating Hormone) (84443-TSH)   Problem # 2:  OSTEOPOROSIS (ICD-733.00) Assessment: Unchanged  Her  updated medication list for this problem includes:    Calcium 600 1500 Mg Tabs (Calcium carbonate) .Marland Kitchen... As directed  Orders: Venipuncture (60454) TLB-BMP (Basic Metabolic Panel-BMET) (80048-METABOL) TLB-CBC Platelet - w/Differential (85025-CBCD) TLB-Hepatic/Liver Function Pnl (80076-HEPATIC) TLB-TSH (Thyroid Stimulating Hormone) (84443-TSH)   Problem # 3:  HYPERTENSION (ICD-401.9) Assessment: Improved  Her updated medication list for this problem includes:    Atenolol 25 Mg Tabs (Atenolol) .Marland Kitchen... Take 1 tablet by mouth every morning  Orders: Venipuncture (09811) EKG w/ Interpretation (93000) TLB-BMP (Basic Metabolic Panel-BMET) (80048-METABOL) TLB-CBC Platelet - w/Differential (85025-CBCD) TLB-Hepatic/Liver Function Pnl (80076-HEPATIC)  TLB-TSH (Thyroid Stimulating Hormone) (84443-TSH)   Complete Medication List: 1)  Atenolol 25 Mg Tabs (Atenolol) .... Take 1 tablet by mouth every morning 2)  Adult Aspirin Low Strength 81 Mg Tbdp (Aspirin) 3)  Multivitamins Caps (Multiple vitamin) .... One by mouth daily 4)  Calcium 600 1500 Mg Tabs (Calcium carbonate) .... As directed 5)  Fish Oil 300 Mg Caps (Omega-3 fatty acids) .... As directed. 6)  D 1000 Plus Tabs (Fa-cyanocobalamin-b6-d-ca) .... Once daily  Other Orders: Pneumococcal Vaccine ([address]) Admin 1st Vaccine (60454)   Patient Instructions: 1)  Please schedule a follow-up appointment in 1 year.   Prescriptions: ATENOLOL 25 MG  TABS (ATENOLOL) Take 1 tablet by mouth every morning  #100 x 4   Entered and Authorized by:   Roderick Pee MD   Signed by:   Roderick Pee MD on 09/22/2008   Method used:   Print then Give to Patient   RxID:   [id number]    Pneumovax Vaccine    Vaccine Type: Pneumovax    Site: left deltoid    Mfr: Merck    Dose: 0.5 ml    Route: IM    Given by: Kern Reap CMA    Exp. Date: 02/13/2010    Lot #: [id number]

## 2010-09-02 NOTE — Assessment & Plan Note (Signed)
Summary: FLU SHOT // RS  Nurse Visit   Allergies: 1)  ! Amoxicillin 2)  ! Keflex  Immunizations Administered:  Influenza Vaccine # 1:    Vaccine Type: Fluvax MCR    Site: left deltoid    Mfr: GlaxoSmithKline    Dose: 0.5 ml    Route: IM    Given by: Kathrynn Speed CMA    Exp. Date: 01/29/2011    Lot #: [id number]    VIS given: 02/23/10 version given May 04, 2010.  Flu Vaccine Consent Questions:    Do you have a history of severe allergic reactions to this vaccine? no    Any prior history of allergic reactions to egg and/or gelatin? no    Do you have a sensitivity to the preservative Thimersol? no    Do you have a past history of Guillan-Barre Syndrome? no    Do you currently have an acute febrile illness? no    Have you ever had a severe reaction to latex? no    Vaccine information given and explained to patient? yes    Are you currently pregnant? no  Orders Added: 1)  Influenza Vaccine MCR [00025]

## 2010-09-02 NOTE — Assessment & Plan Note (Signed)
Summary: wheezing/cdw   Vital Signs:  Patient profile:   77 year old female Temp:     98.6 degrees F oral Pulse rate:   62 / minute Pulse rhythm:   irregular BP sitting:   142 / 82  (right arm) Cuff size:   regular  Vitals Entered By: Kern Reap CMA [name]) (October 05, 2009 10:56 AM)  Reason for Visit cough, wheezing  History of Present Illness: Taylor Blankenship is a 77 year old, married female, nonsmoker, who comes in today for evaluation of wheezing.  She's had a viral infection for about a week.  Over the weekend she noticed wheezing.  No fever, chills, sputum production, et Karie Soda.  In the, past.  She's had a history of asthma when she gets a bad viral infection.  She's been taking Hydromet 1 teaspoon at bedtime, able to sleep, not waking up short of breath  Allergies: 1)  ! Amoxicillin 2)  ! Keflex  Past History:  Past medical, surgical, family and social histories (including risk factors) reviewed for relevance to current acute and chronic problems.  Past Medical History: Reviewed history from 05/28/2007 and no changes required. PMS DJD R Knee Hypertension Osteoporosis Shingles  Past Surgical History: Reviewed history from 05/28/2007 and no changes required. Colonoscopy CB x2  Family History: Reviewed history from 05/28/2007 and no changes required. Family History of Arthritis Family History of CAD Female 1st degree relative <50 Family History of Colon CA 1st degree relative <60 Family History Diabetes 1st degree relative Family History of Endocrine disorder Family History of Respiratory disease  Social History: Reviewed history from 09/25/2008 and no changes required. Retired Charity fundraiser  Married Never Smoked Alcohol use-yes Regular exercise-yes  Review of Systems      See HPI  Physical Exam  General:  Well-developed,well-nourished,in no acute distress; alert,appropriate and cooperative throughout examination Head:  Normocephalic and atraumatic without obvious  abnormalities. No apparent alopecia or balding. Eyes:  No corneal or conjunctival inflammation noted. EOMI. Perrla. Funduscopic exam benign, without hemorrhages, exudates or papilledema. Vision grossly normal. Ears:  External ear exam shows no significant lesions or deformities.  Otoscopic examination reveals clear canals, tympanic membranes are intact bilaterally without bulging, retraction, inflammation or discharge. Hearing is grossly normal bilaterally. Nose:  External nasal examination shows no deformity or inflammation. Nasal mucosa are pink and moist without lesions or exudates. Mouth:  Oral mucosa and oropharynx without lesions or exudates.  Teeth in good repair. Neck:  No deformities, masses, or tenderness noted. Chest Wall:  No deformities, masses, or tenderness noted. Lungs:  symmetrical breath sounds 1+ expiratory wheezing   Problems:  Medical Problems Added: 1)  Dx of Asthma  (ICD-493.90)  Impression & Recommendations:  Problem # 1:  ASTHMA (ICD-493.90) Assessment New  Her updated medication list for this problem includes:    Prednisone 20 Mg Tabs (Prednisone) ..... Uad  Orders: Prescription Created Electronically [phone])  Complete Medication List: 1)  Atenolol 25 Mg Tabs (Atenolol) .... Take 1 tablet by mouth every morning 2)  Adult Aspirin Low Strength 81 Mg Tbdp (Aspirin) 3)  Multivitamins Caps (Multiple vitamin) .... One by mouth daily 4)  Calcium 600 1500 Mg Tabs (Calcium carbonate) .... As directed 5)  Fish Oil 300 Mg Caps (Omega-3 fatty acids) .... As directed. 6)  D 1000 Plus Tabs (Fa-cyanocobalamin-b6-d-ca) .... Once daily 7)  Prednisone 20 Mg Tabs (Prednisone) .... Uad 8)  Doxycycline Hyclate 100 Mg Caps (Doxycycline hyclate) .... Take 1 tablet by mouth two times a day  Patient  Instructions: 1)  begin prednisone, take two tablets daily x 3 days, one x 3 days, a half x 3 days, then half a tablet Monday, Wednesday, Friday, for a two week taper also  doxycycline 100 mg twice a day for 10 days.  Return p.r.n. Prescriptions: DOXYCYCLINE HYCLATE 100 MG CAPS (DOXYCYCLINE HYCLATE) Take 1 tablet by mouth two times a day  #20 x 0   Entered and Authorized by:   Roderick Pee MD   Signed by:   Roderick Pee MD on 10/05/2009   Method used:   Electronically to        [facility]  [phone]* (retail)       [address] [place], [place]  [address]       Ph: [phone] or [phone]       Fax: [phone]   RxID:   [id number] PREDNISONE 20 MG TABS (PREDNISONE) UAD  #30 x 1   Entered and Authorized by:   Roderick Pee MD   Signed by:   Roderick Pee MD on 10/05/2009   Method used:   Electronically to        [facility]  [phone]* (retail)       [address] [place], [place]  [address]       Ph: [phone] or [phone]       Fax: [phone]   RxID:   [id number]

## 2010-09-02 NOTE — Progress Notes (Signed)
Summary: Rx cough med  Phone Note Call from Patient Call back at Home Phone [phone]   Caller: daughter,Taylor Blankenship,[phone] Call For: todd Summary of Call: Bad cold, fever gone, recovering.  Cough keeping her up at night.  Requesting cough med so she can sleep at night.  Don't want to get her out because I don't think that will help her recovery.  [facility].  See records for alleriges amoxicillin & keflex.   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   Please call me.   Initial call taken by: Rudy Jew, RN,  August 04, 2009 10:48 AM  Follow-up for Phone Call        Hydromet dispense 8 ounces directions one or 2 teaspoons nightly p.r.n. cough and cold refills x 1 Follow-up by: Roderick Pee MD,  August 04, 2009 1:33 PM  Additional Follow-up for Phone Call Additional follow up Details #1::        Rx Called In patient is aware Additional Follow-up by: Kern Reap CMA [name]),  August 04, 2009 2:12 PM

## 2010-09-02 NOTE — Assessment & Plan Note (Signed)
Summary: lesion removal/njr   Procedure Note Last Tetanus: Historical (08/01/2005)  Mole Biopsy/Removal: Indication: changing lesion  Procedure # 1: elliptical incision with 3 mm margin    Size (in cm): 1.1 x 1.2    Region: anterior    Location: leg-upper-right    Instrument used: #15 blade    Anesthesia: 1% lidocaine w/epinephrine    Closure: cauy  Cleaned and prepped with: alcohol Wound dressing: neosporin and bandaid   History of Present Illness: Taylor Blankenship. is a 77 year old, married female, nonsmoker, who comes in today for removal of a red, irritated growing lesion on her right anterior thigh.  Allergies: 1)  ! Amoxicillin 2)  ! Keflex   Complete Medication List: 1)  Atenolol 25 Mg Tabs (Atenolol) .... Take 1 tablet by mouth every morning 2)  Adult Aspirin Low Strength 81 Mg Tbdp (Aspirin) 3)  Multivitamins Caps (Multiple vitamin) .... One by mouth daily 4)  Calcium 600 1500 Mg Tabs (Calcium carbonate) .... As directed 5)  Fish Oil 300 Mg Caps (Omega-3 fatty acids) .... As directed. 6)  D 1000 Plus Tabs (Fa-cyanocobalamin-b6-d-ca) .... Once daily  Other Orders: Excise lesion (TAL) 0.6 - 1.0 (11401)

## 2010-09-02 NOTE — Miscellaneous (Signed)
Summary: BONE DENSITY  Clinical Lists Changes  Orders: Added new Test order of T-Bone Densitometry (77080) - Signed Added new Test order of T-Lumbar Vertebral Assessment (77082) - Signed 

## 2010-09-02 NOTE — Assessment & Plan Note (Signed)
Summary: flu shot/mhf  Nurse Visit    Prior Medications: ATENOLOL 25 MG  TABS (ATENOLOL) Take 1 tablet by mouth every morning ADULT ASPIRIN LOW STRENGTH 81 MG  TBDP (ASPIRIN)  MULTIVITAMINS   CAPS (MULTIPLE VITAMIN) one by mouth daily CALCIUM 600 1500 MG  TABS (CALCIUM CARBONATE) as directed FISH OIL 300 MG  CAPS (OMEGA-3 FATTY ACIDS) as directed. Current Allergies: ! AMOXICILLIN ! KEFLEX    Orders Added: 1)  Flu Vaccine 82yrs + [90658] 2)  Administration Flu vaccine [G0008]  Flu Vaccine Consent Questions     Do you have a history of severe allergic reactions to this vaccine? no    Any prior history of allergic reactions to egg and/or gelatin? no    Do you have a sensitivity to the preservative Thimersol? no    Do you have a past history of Guillan-Barre Syndrome? no    Do you currently have an acute febrile illness? no    Have you ever had a severe reaction to latex? no    Vaccine information given and explained to patient? yes    Are you currently pregnant? no    Lot Number:[id number]   Exp Date:01/28/2009   Site Given  Left Deltoid IM].medflu

## 2010-09-16 ENCOUNTER — Other Ambulatory Visit: Payer: Self-pay | Admitting: Family Medicine

## 2010-09-16 DIAGNOSIS — Z1231 Encounter for screening mammogram for malignant neoplasm of breast: Secondary | ICD-10-CM

## 2010-10-04 ENCOUNTER — Ambulatory Visit
Admission: RE | Admit: 2010-10-04 | Discharge: 2010-10-04 | Disposition: A | Discharge status: Home / Self Care | Payer: Medicare Other | Source: Ambulatory Visit | Attending: Family Medicine | Admitting: Family Medicine

## 2010-10-04 DIAGNOSIS — Z1231 Encounter for screening mammogram for malignant neoplasm of breast: Secondary | ICD-10-CM

## 2010-10-20 ENCOUNTER — Encounter: Payer: Self-pay | Admitting: Internal Medicine

## 2010-10-27 ENCOUNTER — Encounter: Payer: Self-pay | Admitting: Family Medicine

## 2010-10-28 ENCOUNTER — Ambulatory Visit (INDEPENDENT_AMBULATORY_CARE_PROVIDER_SITE_OTHER): Payer: Medicare Other | Admitting: Family Medicine

## 2010-10-28 ENCOUNTER — Encounter: Payer: Self-pay | Admitting: Family Medicine

## 2010-10-28 VITALS — BP 120/80 | Temp 98.7°F | Ht 64.0 in | Wt 187.0 lb

## 2010-10-28 DIAGNOSIS — I10 Essential (primary) hypertension: Secondary | ICD-10-CM

## 2010-10-28 DIAGNOSIS — L309 Dermatitis, unspecified: Secondary | ICD-10-CM

## 2010-10-28 DIAGNOSIS — L989 Disorder of the skin and subcutaneous tissue, unspecified: Secondary | ICD-10-CM

## 2010-10-28 DIAGNOSIS — R002 Palpitations: Secondary | ICD-10-CM

## 2010-10-28 DIAGNOSIS — M810 Age-related osteoporosis without current pathological fracture: Secondary | ICD-10-CM

## 2010-10-28 LAB — HEPATIC FUNCTION PANEL
ALT: 26 U/L (ref 0–35)
AST: 30 U/L (ref 0–37)
Albumin: 4 g/dL (ref 3.5–5.2)
Alkaline Phosphatase: 68 U/L (ref 39–117)
Bilirubin, Direct: 0.1 mg/dL (ref 0.0–0.3)
Total Bilirubin: 1.1 mg/dL (ref 0.3–1.2)
Total Protein: 7.1 g/dL (ref 6.0–8.3)

## 2010-10-28 LAB — LIPID PANEL
Cholesterol: 199 mg/dL (ref 0–200)
HDL: 68.4 mg/dL (ref >39.00)
LDL Cholesterol: 103 mg/dL — ABNORMAL HIGH (ref 0–99)
Total CHOL/HDL Ratio: 3
Triglycerides: 137 mg/dL (ref 0.0–149.0)
VLDL: 27.4 mg/dL (ref 0.0–40.0)

## 2010-10-28 LAB — CBC WITH DIFFERENTIAL/PLATELET
Basophils Absolute: 0.1 10*3/uL (ref 0.0–0.1)
Basophils Relative: 0.9 % (ref 0.0–3.0)
Eosinophils Absolute: 0.2 10*3/uL (ref 0.0–0.7)
Eosinophils Relative: 3.2 % (ref 0.0–5.0)
HCT: 40.8 % (ref 36.0–46.0)
Hemoglobin: 13.9 g/dL (ref 12.0–15.0)
Lymphocytes Relative: 31.5 % (ref 12.0–46.0)
Lymphs Abs: 2.1 10*3/uL (ref 0.7–4.0)
MCHC: 34.1 g/dL (ref 30.0–36.0)
MCV: 94.4 fl (ref 78.0–100.0)
Monocytes Absolute: 0.7 10*3/uL (ref 0.1–1.0)
Monocytes Relative: 10.3 % (ref 3.0–12.0)
Neutro Abs: 3.6 10*3/uL (ref 1.4–7.7)
Neutrophils Relative %: 54.1 % (ref 43.0–77.0)
Platelets: 201 10*3/uL (ref 150.0–400.0)
RBC: 4.32 Mil/uL (ref 3.87–5.11)
RDW: 13.7 % (ref 11.5–14.6)
WBC: 6.6 10*3/uL (ref 4.5–10.5)

## 2010-10-28 LAB — BASIC METABOLIC PANEL
BUN: 17 mg/dL (ref 6–23)
CO2: 30 mEq/L (ref 19–32)
Calcium: 9.4 mg/dL (ref 8.4–10.5)
Chloride: 106 mEq/L (ref 96–112)
Creatinine, Ser: 0.8 mg/dL (ref 0.4–1.2)
GFR: 70.36 mL/min (ref >60.00)
Glucose, Bld: 89 mg/dL (ref 70–99)
Potassium: 5.3 mEq/L — ABNORMAL HIGH (ref 3.5–5.1)
Sodium: 143 mEq/L (ref 135–145)

## 2010-10-28 LAB — POCT URINALYSIS DIPSTICK
Bilirubin, UA: NEGATIVE
Blood, UA: NEGATIVE
Glucose, UA: NEGATIVE
Ketones, UA: NEGATIVE
Nitrite, UA: NEGATIVE
Protein, UA: NEGATIVE
Spec Grav, UA: 1.015
Urobilinogen, UA: 0.2
pH, UA: 7

## 2010-10-28 LAB — TSH: TSH: 3.33 u[IU]/mL (ref 0.35–5.50)

## 2010-10-28 MED ORDER — TRIAMCINOLONE ACETONIDE 0.025 % EX OINT: TOPICAL_OINTMENT | Freq: Two times a day (BID) | CUTANEOUS | Status: DC

## 2010-10-28 MED ORDER — ATENOLOL 25 MG PO TABS: 25.0000 mg | ORAL_TABLET | ORAL | Status: DC

## 2010-10-28 NOTE — Letter (Signed)
Summary: Colonoscopy Letter  [facility]  [address] [place], [place] [address]   Phone: [phone]  Fax: [phone]      October 20, 2010 MRN: [id number]   [facility] [address] [place], [place]  [address]   Dear Ms. Ibsen,   According to your medical record, it is time for you to schedule a Colonoscopy. The American Cancer Society recommends this procedure as a method to detect early colon cancer. Patients with a family history of colon cancer, or a personal history of colon polyps or inflammatory bowel disease are at increased risk.  This letter has been generated based on the recommendations made at the time of your procedure. If you feel that in your particular situation this may no longer apply, please contact our office.  Please call our office at [phone] to schedule this appointment or to update your records at your earliest convenience.  Thank you for cooperating with [name] to provide you with the very best care possible.   Sincerely,  Hedwig Morton. Juanda Chance, M.D.  [facility] [phone]

## 2010-10-28 NOTE — Progress Notes (Signed)
  Subjective:    Patient ID: Taylor Blankenship, female    DOB: [birth year], 77 y.o.   MRN: [id number]  Ron Parker is a 77 year old, married female, nonsmoker, who comes in today for annual exam.  Because of a history of underlying, palpitations, and hypertension.  She takes Tenormin 25 mg daily BP 120/80.  She'll occasionally have episodes where her heart were racing skip should take an extra Tenormin for a day or two and then abates spontaneously.  She also takes an aspirin tablet daily, however, she is having trouble with bruising.  Advised to take aspirin Monday, Wednesday, Friday.  She gets routine eye care, dental care, BSE monthly, a new mammography, colonoscopy, no longer required, tetanus, 2007, Pneumovax done x 2.  Information given on shingles      Review of Systems  Constitutional: Negative.   HENT: Negative.   Eyes: Negative.   Respiratory: Negative.   Cardiovascular: Negative.   Gastrointestinal: Negative.   Genitourinary: Negative.   Musculoskeletal: Negative.   Neurological: Negative.   Hematological: Negative.   Psychiatric/Behavioral: Negative.        Objective:   Physical Exam  Constitutional: She appears well-developed and well-nourished.  HENT:  Head: Normocephalic and atraumatic.  Right Ear: External ear normal.  Left Ear: External ear normal.  Nose: Nose normal.  Mouth/Throat: Oropharynx is clear and moist.  Eyes: EOM are normal. Pupils are equal, round, and reactive to light.  Neck: Normal range of motion. Neck supple. No thyromegaly present.  Cardiovascular: Normal rate, regular rhythm, normal heart sounds and intact distal pulses.  Exam reveals no gallop and no friction rub.   No murmur heard. Pulmonary/Chest: Effort normal and breath sounds normal.  Abdominal: Soft. Bowel sounds are normal. She exhibits no distension and no mass. There is no tenderness. There is no rebound.  Musculoskeletal: Normal range of motion.  Lymphadenopathy:    She has no  cervical adenopathy.  Neurological: She is alert. She has normal reflexes. No cranial nerve deficit. She exhibits normal muscle tone. Coordination normal.  Skin: Skin is warm and dry.  Psychiatric: She has a normal mood and affect. Her behavior is normal. Judgment and thought content normal.          Assessment & Plan:  ,Hypertension,,,,,,,,,,,,, continue current medications.  History of osteoporosis.  Continue Exercise, calcium, vitamin D.,,,,,,,,,, she goes to the gym 4 days a week.  Abnormal lesion left ear referred to ENT

## 2010-10-28 NOTE — Patient Instructions (Signed)
Continue your current medications.  Call the ear, nose, and throat doctor for   the lesion in her left ear.  Return one year or sooner if any problem

## 2010-11-01 NOTE — Progress Notes (Signed)
patient  Is aware 

## 2011-06-17 ENCOUNTER — Ambulatory Visit (INDEPENDENT_AMBULATORY_CARE_PROVIDER_SITE_OTHER): Payer: Medicare Other | Admitting: *Deleted

## 2011-06-17 DIAGNOSIS — Z23 Encounter for immunization: Secondary | ICD-10-CM

## 2011-09-05 ENCOUNTER — Other Ambulatory Visit: Payer: Self-pay | Admitting: Family Medicine

## 2011-09-05 DIAGNOSIS — Z1231 Encounter for screening mammogram for malignant neoplasm of breast: Secondary | ICD-10-CM

## 2011-09-27 ENCOUNTER — Encounter: Payer: Self-pay | Admitting: Family Medicine

## 2011-09-28 ENCOUNTER — Ambulatory Visit (INDEPENDENT_AMBULATORY_CARE_PROVIDER_SITE_OTHER): Payer: Medicare Other | Admitting: Family Medicine

## 2011-09-28 ENCOUNTER — Encounter: Payer: Self-pay | Admitting: Family Medicine

## 2011-09-28 VITALS — BP 110/70 | Temp 98.1°F | Ht 63.25 in | Wt 189.0 lb

## 2011-09-28 DIAGNOSIS — Z0000 Encounter for general adult medical examination without abnormal findings: Secondary | ICD-10-CM

## 2011-09-28 DIAGNOSIS — I10 Essential (primary) hypertension: Secondary | ICD-10-CM

## 2011-09-28 DIAGNOSIS — I499 Cardiac arrhythmia, unspecified: Secondary | ICD-10-CM

## 2011-09-28 LAB — CBC WITH DIFFERENTIAL/PLATELET
Basophils Absolute: 0.1 10*3/uL (ref 0.0–0.1)
Basophils Relative: 1 % (ref 0.0–3.0)
Eosinophils Absolute: 0.3 10*3/uL (ref 0.0–0.7)
Eosinophils Relative: 3.9 % (ref 0.0–5.0)
HCT: 40.9 % (ref 36.0–46.0)
Hemoglobin: 13.8 g/dL (ref 12.0–15.0)
Lymphocytes Relative: 34 % (ref 12.0–46.0)
Lymphs Abs: 2.3 10*3/uL (ref 0.7–4.0)
MCHC: 33.6 g/dL (ref 30.0–36.0)
MCV: 93 fl (ref 78.0–100.0)
Monocytes Absolute: 0.6 10*3/uL (ref 0.1–1.0)
Monocytes Relative: 8.3 % (ref 3.0–12.0)
Neutro Abs: 3.6 10*3/uL (ref 1.4–7.7)
Neutrophils Relative %: 52.8 % (ref 43.0–77.0)
Platelets: 211 10*3/uL (ref 150.0–400.0)
RBC: 4.4 Mil/uL (ref 3.87–5.11)
RDW: 13.6 % (ref 11.5–14.6)
WBC: 6.8 10*3/uL (ref 4.5–10.5)

## 2011-09-28 LAB — POCT URINALYSIS DIPSTICK
Bilirubin, UA: NEGATIVE
Blood, UA: NEGATIVE
Glucose, UA: NEGATIVE
Ketones, UA: NEGATIVE
Nitrite, UA: NEGATIVE
Protein, UA: NEGATIVE
Spec Grav, UA: 1.015
Urobilinogen, UA: 0.2
pH, UA: 6

## 2011-09-28 LAB — HEPATIC FUNCTION PANEL
ALT: 27 U/L (ref 0–35)
AST: 28 U/L (ref 0–37)
Albumin: 4.2 g/dL (ref 3.5–5.2)
Alkaline Phosphatase: 71 U/L (ref 39–117)
Bilirubin, Direct: 0 mg/dL (ref 0.0–0.3)
Total Bilirubin: 0.9 mg/dL (ref 0.3–1.2)
Total Protein: 7.4 g/dL (ref 6.0–8.3)

## 2011-09-28 LAB — BASIC METABOLIC PANEL
BUN: 15 mg/dL (ref 6–23)
CO2: 27 mEq/L (ref 19–32)
Calcium: 9.3 mg/dL (ref 8.4–10.5)
Chloride: 105 mEq/L (ref 96–112)
Creatinine, Ser: 0.8 mg/dL (ref 0.4–1.2)
GFR: 74.31 mL/min (ref >60.00)
Glucose, Bld: 89 mg/dL (ref 70–99)
Potassium: 4.3 mEq/L (ref 3.5–5.1)
Sodium: 140 mEq/L (ref 135–145)

## 2011-09-28 LAB — TSH: TSH: 3.35 u[IU]/mL (ref 0.35–5.50)

## 2011-09-28 MED ORDER — ATENOLOL 12.5 MG HALF TABLET: 12.5000 mg | ORAL_TABLET | Freq: Every day | ORAL | Status: DC

## 2011-09-28 NOTE — Progress Notes (Signed)
  Subjective:    Patient ID: Taylor Blankenship, female    DOB: Mar 21, 1929, 78 y.o.   MRN: [id number]  HPI Ramona is a delightful 78 year old married female nonsmoker who comes in today for a Medicare wellness examination  She takes 12.5 mg of Tenormin for hypertension BP 110/70 we'll cut that back to 6.25 mg  She takes calcium vitamin D and walks on a regular basis to help prevent osteoporosis. She has degenerative joint disease of both knees  Also recently she states she's had a cardiac arrhythmia. She feels like it skips and then it stops. She denies any chest pain. Her daughter states she has episodes where her lips get blue and she gets shortness of breath. The patient denies this. Tetanus booster 2007, seasonal flu 2012, Pneumovax x2, shingles information given  She gets routine eye care, has a hearing aid in her right ear that doesn't work, regular dental care, no longer requires mammography or colonoscopy  Cognitive function normal she lives at home with her husband. She goes to the gym sporadically. Home health safety reviewed no issues identified, no guns in the house, she does have a health care power of attorney and living will.   Review of Systems  Constitutional: Negative.   HENT: Negative.   Eyes: Negative.   Respiratory: Negative.   Cardiovascular: Negative.   Gastrointestinal: Negative.   Genitourinary: Negative.   Musculoskeletal: Negative.   Neurological: Negative.   Hematological: Negative.   Psychiatric/Behavioral: Negative.        Objective:   Physical Exam  Constitutional: She appears well-developed and well-nourished.  HENT:  Head: Normocephalic and atraumatic.  Right Ear: External ear normal.  Left Ear: External ear normal.  Nose: Nose normal.  Mouth/Throat: Oropharynx is clear and moist.  Eyes: EOM are normal. Pupils are equal, round, and reactive to light.  Neck: Normal range of motion. Neck supple. No thyromegaly present.  Cardiovascular: Normal  rate, regular rhythm, normal heart sounds and intact distal pulses.  Exam reveals no gallop and no friction rub.   No murmur heard. Pulmonary/Chest: Effort normal and breath sounds normal.  Abdominal: Soft. Bowel sounds are normal. She exhibits no distension and no mass. There is no tenderness. There is no rebound.  Genitourinary:       Bilateral breast exam normal  Musculoskeletal: Normal range of motion.  Lymphadenopathy:    She has no cervical adenopathy.  Neurological: She is alert. She has normal reflexes. No cranial nerve deficit. She exhibits normal muscle tone. Coordination normal.  Skin: Skin is warm and dry.  Psychiatric: She has a normal mood and affect. Her behavior is normal. Judgment and thought content normal.          Assessment & Plan:  Hypertension BP too low decrease beta blocker to 6 mg daily followup in 6 weeks  Symptoms consistent with a cardiac arrhythmia recommend a event monitor patient declines  Osteoarthritis Motrin 400 mg prior to exercise and elevation and ice after exercise  Profound hearing loss right ear referred to [facility] for a new hearing aid.

## 2011-09-28 NOTE — Patient Instructions (Signed)
We will set you up a appointment to go to cardiology for an event monitor to determine the cause severe arrhythmia  Decrease the beta blocker,,,,,,,,,, Tenormin 12.5 mg,,,,,,,,, one half tab every morning followup BP first week in April  As takes 400 mg of Motrin one hour prior to exercise and postexercise elevation and ice for your knees is

## 2011-10-04 ENCOUNTER — Telehealth: Payer: Self-pay | Admitting: Family Medicine

## 2011-10-04 NOTE — Telephone Encounter (Signed)
Patient called stating the md was changing her atenolol from 25 mg to 12.5mg  upon picking up rx she was given 25 and she has plenty of that. The pharmacy stated that they called the nurse who told them to give her the 25mg . Patient would like a call back with clarification. Please assist.

## 2011-10-05 ENCOUNTER — Ambulatory Visit
Admission: RE | Admit: 2011-10-05 | Discharge: 2011-10-05 | Disposition: A | Discharge status: Home / Self Care | Payer: Medicare Other | Source: Ambulatory Visit | Attending: Family Medicine | Admitting: Family Medicine

## 2011-10-05 DIAGNOSIS — Z1231 Encounter for screening mammogram for malignant neoplasm of breast: Secondary | ICD-10-CM

## 2011-10-26 ENCOUNTER — Ambulatory Visit: Payer: Medicare Other | Admitting: Family Medicine

## 2012-04-18 ENCOUNTER — Encounter: Payer: Self-pay | Admitting: Internal Medicine

## 2012-05-17 ENCOUNTER — Ambulatory Visit (INDEPENDENT_AMBULATORY_CARE_PROVIDER_SITE_OTHER): Payer: Medicare Other

## 2012-05-17 DIAGNOSIS — Z23 Encounter for immunization: Secondary | ICD-10-CM

## 2012-09-04 ENCOUNTER — Other Ambulatory Visit: Payer: Self-pay | Admitting: Family Medicine

## 2012-09-04 DIAGNOSIS — Z1231 Encounter for screening mammogram for malignant neoplasm of breast: Secondary | ICD-10-CM

## 2012-10-05 ENCOUNTER — Ambulatory Visit: Payer: Medicare Other

## 2012-10-23 ENCOUNTER — Encounter: Payer: Medicare Other | Admitting: Family Medicine

## 2012-11-14 ENCOUNTER — Ambulatory Visit (INDEPENDENT_AMBULATORY_CARE_PROVIDER_SITE_OTHER): Payer: Medicare Other | Admitting: Family Medicine

## 2012-11-14 ENCOUNTER — Ambulatory Visit
Admission: RE | Admit: 2012-11-14 | Discharge: 2012-11-14 | Disposition: A | Discharge status: Home / Self Care | Payer: Medicare Other | Source: Ambulatory Visit | Attending: Family Medicine | Admitting: Family Medicine

## 2012-11-14 ENCOUNTER — Telehealth: Payer: Self-pay | Admitting: Family Medicine

## 2012-11-14 ENCOUNTER — Encounter: Payer: Self-pay | Admitting: Family Medicine

## 2012-11-14 VITALS — BP 150/84 | Temp 98.5°F | Wt 190.0 lb

## 2012-11-14 DIAGNOSIS — J45909 Unspecified asthma, uncomplicated: Secondary | ICD-10-CM

## 2012-11-14 DIAGNOSIS — Z1231 Encounter for screening mammogram for malignant neoplasm of breast: Secondary | ICD-10-CM

## 2012-11-14 MED ORDER — HYDROCODONE-HOMATROPINE 5-1.5 MG/5ML PO SYRP: 5.0000 mL | ORAL_SOLUTION | Freq: Three times a day (TID) | ORAL | Status: DC | PRN

## 2012-11-14 MED ORDER — PREDNISONE 20 MG PO TABS: ORAL_TABLET | ORAL | Status: DC

## 2012-11-14 MED ORDER — DOXYCYCLINE HYCLATE 100 MG PO TABS: 100.0000 mg | ORAL_TABLET | Freq: Two times a day (BID) | ORAL | Status: DC

## 2012-11-14 NOTE — Telephone Encounter (Signed)
PT states that she believes she has a sinus infection and would like to be seen by Dr. Tawanna Cooler this morning. Please assist.

## 2012-11-14 NOTE — Telephone Encounter (Signed)
Scheduled

## 2012-11-14 NOTE — Progress Notes (Signed)
  Subjective:    Patient ID: Taylor Blankenship, female    DOB: 08/18/1928, 79 y.o.   MRN: [id number]  HPI  Taylor Blankenship is an 79 year old married female nonsmoker who comes in today with a six-day history of head congestion sore throat cough and a three-day history of right-sided facial pain  Review of systems otherwise negative except she's had a history of asthma X. Smoker years ago   Review of Systems    review of systems otherwise negative Objective:   Physical Exam  Well-developed and nourished female no acute distress HEENT were negative neck was supple no adenopathy lungs are clear except for symmetrical late expiratory wheezing bilaterally      Assessment & Plan:  Viral syndrome with secondary asthma plan prednisone burst and taper doxycycline twice a day

## 2012-11-14 NOTE — Telephone Encounter (Signed)
Please schedule patient at 11:45 today.  Patient is aware.

## 2012-11-14 NOTE — Patient Instructions (Addendum)
Drink lots of water  Doxycycline one twice daily for 10 days  Prednisone as directed  Hydromet 1/2-1 teaspoon each bedtime when necessary for cough  Return when necessary

## 2012-11-16 ENCOUNTER — Encounter: Payer: Self-pay | Admitting: Internal Medicine

## 2012-11-16 ENCOUNTER — Ambulatory Visit (INDEPENDENT_AMBULATORY_CARE_PROVIDER_SITE_OTHER): Payer: Medicare Other | Admitting: Internal Medicine

## 2012-11-16 VITALS — BP 136/82 | HR 68 | Temp 97.7°F | Resp 16 | Wt 191.0 lb

## 2012-11-16 DIAGNOSIS — T7840XA Allergy, unspecified, initial encounter: Secondary | ICD-10-CM

## 2012-11-16 MED ORDER — LORATADINE 10 MG PO TABS: 10.0000 mg | ORAL_TABLET | Freq: Every day | ORAL | Status: DC

## 2012-11-16 NOTE — Progress Notes (Signed)
  Subjective:    Patient ID: Taylor Blankenship, female    DOB: Feb 08, 1929, 79 y.o.   MRN: [id number]  HPI  Complains of face rash associated with itching and swelling Onset 2 days ago- immediately following doxy administration x 3 doses ?Precipitated by drug reaction On doxycycline and prednisone taper for treatment of sinus symptoms and asthma exacerbation ( office visit with primary 2d ago reviewed)  Past Medical History  Diagnosis Date  . Actinic keratosis   . ASTHMA   . Dermatophytosis of nail   . HYPERTENSION   . OSTEOPOROSIS   . PALPITATIONS, RECURRENT   . DJD (degenerative joint disease)   . History of shingles     Review of Systems  Constitutional: Negative for fever, chills and fatigue.  Respiratory: Negative for cough, chest tightness, shortness of breath, wheezing and stridor.   Cardiovascular: Negative for chest pain and palpitations.  Skin: Positive for rash.       Objective:   Physical Exam BP 136/82  Pulse 68  Temp(Src) 97.7 F (36.5 C) (Oral)  Resp 16  Wt 191 lb (86.637 kg)  BMI 33.55 kg/m2  SpO2 98% Wt Readings from Last 3 Encounters:  11/16/12 191 lb (86.637 kg)  11/14/12 190 lb (86.183 kg)  09/28/11 189 lb (85.73 kg)   Constitutional: She appears well-developed and well-nourished. No distress. dtr at side HENT: Head: Normocephalic and atraumatic. Sinus nontender Nose: clear rhinorrhea, pale turbinates.Mouth/Throat: Oropharynx is clear and moist. No oropharyngeal exudate. no tongue swelling or oropharyngeal edema Eyes: Conjunctivae and EOM are normal. Pupils are equal, round, and reactive to light. No scleral icterus.  Neck: Normal range of motion. Neck supple. No JVD present. No thyromegaly present.  Cardiovascular: Normal rate, regular rhythm and normal heart sounds.  No murmur heard. No BLE edema. Pulmonary/Chest: Effort normal and breath sounds normal. No respiratory distress. She has no wheezes.  Neurological: She is alert and oriented to  person, place, and time. No cranial nerve deficit. Coordination normal.  Skin: very mild diffuse facial erythema, no discrete papules, urticaria or ulceration. Skin is warm and dry. No rash noted.   Psychiatric: She has a normal mood and affect. Her behavior is normal. Judgment and thought content normal.   Lab Results  Component Value Date   WBC 6.8 09/28/2011   HGB 13.8 09/28/2011   HCT 40.9 09/28/2011   PLT 211.0 09/28/2011   GLUCOSE 89 09/28/2011   CHOL 199 10/28/2010   TRIG 137.0 10/28/2010   HDL 68.40 10/28/2010   LDLDIRECT 107.4 09/17/2007   LDLCALC 103* 10/28/2010   ALT 27 09/28/2011   AST 28 09/28/2011   NA 140 09/28/2011   K 4.3 09/28/2011   CL 105 09/28/2011   CREATININE 0.8 09/28/2011   BUN 15 09/28/2011   CO2 27 09/28/2011   TSH 3.35 09/28/2011       Assessment & Plan:   Allergic reaction - rash with doxycycline Advised to stop same Complete prednisone taper as prescribed initially for allergic sinusitis symptoms Take Claritin 10 mg once daily for next 7 days, then as needed

## 2012-11-16 NOTE — Patient Instructions (Signed)
It was good to see you today. Stop doycycline - this medication has been added to your allergy list Start Claritin 10 mg once daily for the next 7 days, then as needed for runny nose, allergies, or itch symptoms Complete prednisone as prescribed Call if continued problems or other issues

## 2012-11-19 ENCOUNTER — Telehealth: Payer: Self-pay | Admitting: Family Medicine

## 2012-11-19 NOTE — Telephone Encounter (Signed)
[facility] Triage Call Report Triage Record Num: [id number] Operator: Di Kindle Patient Name: Taylor Blankenship "[facility] Call Date & Time: 11/16/2012 8:45:44AM Patient Phone: [phone] PCP: Eugenio Hoes. [name] Patient Gender: Female PCP Fax : [phone] Patient DOB: Oct 12, 1928 Practice Name: [facility] Reason for Call: [name] reports office visit 11/14/12 given Doxycycline and Prednisone for sinusitis, wheezing, new onset 11/15/12 bright red rash around mouth and face, with itching, afebrile. Guideline: Rash. Disposition: See within 4 hours die to new onset of rash with new antibiotic. Appt 1230 11/16/12 with Dr Felicity Coyer. Verbalizes understanding of care advice. Protocol(s) Used: Rash Recommended Outcome per Protocol: Call Provider within 4 Hours Override Outcome if Used in Protocol: See Provider within 4 hours RN Reason for Override Outcome: Nursing Judgement Used. Reason for Outcome: New onset of rash after beginning new prescribed, nonprescribed, or alternative/complementary medication Care Advice: ~ Rash from a drug reaction may not appear for a week or more after a drug was started. ~ List, or take, all current prescription(s), nonprescription or alternative medication(s) to provider for evaluation. 04/

## 2013-01-03 ENCOUNTER — Ambulatory Visit (INDEPENDENT_AMBULATORY_CARE_PROVIDER_SITE_OTHER): Payer: Medicare Other | Admitting: Family Medicine

## 2013-01-03 ENCOUNTER — Encounter: Payer: Self-pay | Admitting: Family Medicine

## 2013-01-03 VITALS — BP 130/80 | Temp 98.2°F | Ht 64.0 in | Wt 191.0 lb

## 2013-01-03 DIAGNOSIS — L309 Dermatitis, unspecified: Secondary | ICD-10-CM

## 2013-01-03 DIAGNOSIS — R002 Palpitations: Secondary | ICD-10-CM

## 2013-01-03 DIAGNOSIS — H9110 Presbycusis, unspecified ear: Secondary | ICD-10-CM

## 2013-01-03 DIAGNOSIS — L259 Unspecified contact dermatitis, unspecified cause: Secondary | ICD-10-CM

## 2013-01-03 DIAGNOSIS — H9113 Presbycusis, bilateral: Secondary | ICD-10-CM

## 2013-01-03 DIAGNOSIS — M810 Age-related osteoporosis without current pathological fracture: Secondary | ICD-10-CM

## 2013-01-03 DIAGNOSIS — Z800 Family history of malignant neoplasm of digestive organs: Secondary | ICD-10-CM

## 2013-01-03 DIAGNOSIS — I10 Essential (primary) hypertension: Secondary | ICD-10-CM

## 2013-01-03 LAB — POCT URINALYSIS DIPSTICK
Bilirubin, UA: NEGATIVE
Blood, UA: NEGATIVE
Glucose, UA: NEGATIVE
Ketones, UA: NEGATIVE
Nitrite, UA: NEGATIVE
Protein, UA: NEGATIVE
Spec Grav, UA: 1.02
Urobilinogen, UA: 0.2
pH, UA: 6

## 2013-01-03 LAB — CBC WITH DIFFERENTIAL/PLATELET
Basophils Absolute: 0.1 10*3/uL (ref 0.0–0.1)
Basophils Relative: 0.9 % (ref 0.0–3.0)
Eosinophils Absolute: 0.3 10*3/uL (ref 0.0–0.7)
Eosinophils Relative: 4.2 % (ref 0.0–5.0)
HCT: 42.2 % (ref 36.0–46.0)
Hemoglobin: 13.9 g/dL (ref 12.0–15.0)
Lymphocytes Relative: 36.1 % (ref 12.0–46.0)
Lymphs Abs: 2.1 10*3/uL (ref 0.7–4.0)
MCHC: 33.1 g/dL (ref 30.0–36.0)
MCV: 94.6 fl (ref 78.0–100.0)
Monocytes Absolute: 0.6 10*3/uL (ref 0.1–1.0)
Monocytes Relative: 9.8 % (ref 3.0–12.0)
Neutro Abs: 2.9 10*3/uL (ref 1.4–7.7)
Neutrophils Relative %: 49 % (ref 43.0–77.0)
Platelets: 208 10*3/uL (ref 150.0–400.0)
RBC: 4.46 Mil/uL (ref 3.87–5.11)
RDW: 14.2 % (ref 11.5–14.6)
WBC: 5.9 10*3/uL (ref 4.5–10.5)

## 2013-01-03 LAB — BASIC METABOLIC PANEL
BUN: 16 mg/dL (ref 6–23)
CO2: 31 mEq/L (ref 19–32)
Calcium: 9.5 mg/dL (ref 8.4–10.5)
Chloride: 108 mEq/L (ref 96–112)
Creatinine, Ser: 0.9 mg/dL (ref 0.4–1.2)
GFR: 62.13 mL/min (ref >60.00)
Glucose, Bld: 93 mg/dL (ref 70–99)
Potassium: 5.2 mEq/L — ABNORMAL HIGH (ref 3.5–5.1)
Sodium: 143 mEq/L (ref 135–145)

## 2013-01-03 LAB — TSH: TSH: 2.74 u[IU]/mL (ref 0.35–5.50)

## 2013-01-03 MED ORDER — TRIAMCINOLONE ACETONIDE 0.025 % EX OINT: TOPICAL_OINTMENT | Freq: Two times a day (BID) | CUTANEOUS | Status: AC

## 2013-01-03 MED ORDER — ATENOLOL 12.5 MG HALF TABLET: 12.5000 mg | ORAL_TABLET | Freq: Every day | ORAL | Status: DC

## 2013-01-03 NOTE — Progress Notes (Signed)
  Subjective:    Patient ID: Taylor Blankenship, female    DOB: [birth year], 79 y.o.   MRN: [id number]  HPI Taylor Blankenship is a delightful 79 year old married female nonsmoker who comes in today for a Medicare wellness examination because of a history of palpitations and mild hypertension  She takes 6.25 mg of Tenormin daily BP 138/80 pulse 80 and regular  She gets routine eye care, dental care, and you mammography, does not do a breast exam monthly. She had a colonoscopy in fact a couple by Dr. Dickie La because her mother died of colon cancer. She was given a recall card but says of her age she doesn't need to have any more colonoscopies.  Vaccinations up-to-date she declined shingles vaccine  Cognitive function normal she goes to the gym on a daily basis home health safety reviewed no issues identified, no guns in the house, she does have a health care power of attorney and living well   Review of Systems  Constitutional: Negative.   HENT: Negative.   Eyes: Negative.   Respiratory: Negative.   Cardiovascular: Negative.   Gastrointestinal: Negative.   Genitourinary: Negative.   Musculoskeletal: Negative.   Neurological: Negative.   Psychiatric/Behavioral: Negative.        Objective:   Physical Exam  Constitutional: She appears well-developed and well-nourished.  HENT:  Head: Normocephalic and atraumatic.  Right Ear: External ear normal.  Left Ear: External ear normal.  Nose: Nose normal.  Mouth/Throat: Oropharynx is clear and moist.  Eyes: EOM are normal. Pupils are equal, round, and reactive to light.  Neck: Normal range of motion. Neck supple. No thyromegaly present.  Cardiovascular: Normal rate, regular rhythm, normal heart sounds and intact distal pulses.  Exam reveals no gallop and no friction rub.   No murmur heard. Pulmonary/Chest: Effort normal and breath sounds normal.  Abdominal: Soft. Bowel sounds are normal. She exhibits no distension and no mass. There is no tenderness.  There is no rebound.  Genitourinary: Guaiac negative stool.  Bilateral breast exam normal  Rectal exam normal stool guaiac-negative small skin tag scar down nonbleeding at 12:00.  Musculoskeletal: Normal range of motion.  Lymphadenopathy:    She has no cervical adenopathy.  Neurological: She is alert. She has normal reflexes. No cranial nerve deficit. She exhibits normal muscle tone. Coordination normal.  Skin: Skin is warm and dry.  Psychiatric: She has a normal mood and affect. Her behavior is normal. Judgment and thought content normal.          Assessment & Plan:  Healthy female  Mild hypertension and palpitations continue Tenormin 6.25 mg daily  History of intermittent bright red rectal bleeding previously diagnosed with internal hemorrhoids recommend daily stool softener Anusol suppositories when necessary  Also recommend followup colonoscopy patient declines  Hearing loss right ear continue hearing aid  Rash m these petechial-like stop aspirin

## 2013-01-03 NOTE — Patient Instructions (Signed)
Stop all aspirin and aspirin products except take Motrin 400 mg twice daily when necessary for knee pain  When the knee does bother you elevation ice 30 minutes 4 times daily  Stool softener daily  Anusol-HC suppositories one rectally at bedtime for 2 weeks if you have any further rectal bleeding  Also consider a followup colonoscopy because her mother had colon cancer  Continue the Tenormin,,,,,,,,,, one half tablet daily in the morning  Followup in 1 year sooner if any problems  Remember to do a thorough breast exam monthly

## 2013-01-08 ENCOUNTER — Telehealth: Payer: Self-pay | Admitting: *Deleted

## 2013-01-08 NOTE — Telephone Encounter (Signed)
Patient is requesting lab results from last visit

## 2013-01-08 NOTE — Telephone Encounter (Signed)
Patient is aware that lab work is normal and copy mailed to home address.

## 2013-03-06 ENCOUNTER — Ambulatory Visit (INDEPENDENT_AMBULATORY_CARE_PROVIDER_SITE_OTHER): Payer: Medicare Other | Admitting: Family Medicine

## 2013-03-06 ENCOUNTER — Telehealth: Payer: Self-pay | Admitting: Family Medicine

## 2013-03-06 ENCOUNTER — Encounter: Payer: Self-pay | Admitting: Family Medicine

## 2013-03-06 VITALS — BP 130/70 | HR 62 | Temp 99.0°F | Wt 189.0 lb

## 2013-03-06 DIAGNOSIS — J209 Acute bronchitis, unspecified: Secondary | ICD-10-CM

## 2013-03-06 MED ORDER — LEVOFLOXACIN 500 MG PO TABS: 500.0000 mg | ORAL_TABLET | Freq: Every day | ORAL | Status: DC

## 2013-03-06 NOTE — Telephone Encounter (Signed)
Patient Information:  Caller Name: Andrey Campanile  Phone: [phone]  Patient: Taylor Blankenship, Taylor Blankenship  Gender: Female  DOB: Jul 27, 1929  Age: 79 Years  PCP: Kelle Darting [facility])  Office Follow Up:  Does the office need to follow up with this patient?: No  Instructions For The Office: N/A  RN Note:  Daughter is going to call her mother to see if she will agree to be seen.  she will call back to schedule appt. Understanding expressed.  Symptoms  Reason For Call & Symptoms: Daughter is calling about her mother.  She has a cold onset 02/27/13.  +cough productive green, feels weak and congested.  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  Reviewed Health History In EMR: Yes  Reviewed Medications In EMR: Yes  Reviewed Allergies In EMR: Yes  Reviewed Surgeries / Procedures: Yes  Date of Onset of Symptoms: 02/27/2013  Treatments Tried: Advil, cough medication  Treatments Tried Worked: Yes  Guideline(s) Used:  Cough  Disposition Per Guideline:   See Today or Tomorrow in Office  Reason For Disposition Reached:   Patient wants to be seen  Advice Given:  Reassurance  Coughing is the way that our lungs remove irritants and mucus. It helps protect our lungs from getting pneumonia.  You can get a dry hacking cough after a chest cold. Sometimes this type of cough can last 1-3 weeks, and be worse at night.  Cough Medicines:  Home Remedy - Hard Candy: Hard candy works just as well as medicine-flavored OTC cough drops. Diabetics should use sugar-free candy.  Home Remedy - Honey: This old home remedy has been shown to help decrease coughing at night. The adult dosage is 2 teaspoons (10 ml) at bedtime. Honey should not be given to infants under one year of age.  Coughing Spasms:  Drink warm fluids. Inhale warm mist (Reason: both relax the airway and loosen up the phlegm).  Suck on cough drops or hard candy to  coat the irritated throat.  Prevent Dehydration:  Drink adequate liquids.  This will help soothe an irritated or dry throat and loosen up the phlegm.  Call Back If:  Difficulty breathing  Fever lasts > 3 days  You become worse.  RN Overrode Recommendation:  Follow Up With Office Later  Daughter may call back for an appt.

## 2013-03-06 NOTE — Progress Notes (Signed)
  Subjective:    Patient ID: Taylor Blankenship, female    DOB: [birth year], 79 y.o.   MRN: [id number]  HPI Patient is seen as a work in with cough and wheezing for the past week Similar episode back in June. She was treated with doxycycline at that time but had hives. Also has had previous intolerance to penicillin and cephalosporins. Patient is nonsmoker. No history of asthma. Cough productive of green sputum during the past week. No fever. No chills. No respiratory distress. She is taking Hycodan cough syrup for nighttime cough and that helps somewhat. No hemoptysis. No pleuritic pain. No sick contacts.  Past Medical History  Diagnosis Date  . Actinic keratosis   . ASTHMA   . Dermatophytosis of nail   . HYPERTENSION   . OSTEOPOROSIS   . PALPITATIONS, RECURRENT   . DJD (degenerative joint disease)   . History of shingles    No past surgical history on file.  reports that she has quit smoking. She does not have any smokeless tobacco history on file. She reports that  drinks alcohol. She reports that she does not use illicit drugs. family history includes Arthritis in her other; Cancer in her other; Diabetes in her other; and Heart disease in her other. Allergies  Allergen Reactions  . Amoxicillin   . Cephalexin   . Doxycycline Rash      Review of Systems  Constitutional: Negative for fever and chills.  HENT: Positive for congestion.   Respiratory: Positive for cough and wheezing. Negative for shortness of breath.   Cardiovascular: Negative for chest pain, palpitations and leg swelling.       Objective:   Physical Exam  Constitutional: She appears well-developed and well-nourished.  HENT:  Left Ear: External ear normal.  Mouth/Throat: Oropharynx is clear and moist.  Minimal cerumen right canal  Neck: Neck supple. No thyromegaly present.  Cardiovascular: Normal rate.   Pulmonary/Chest: Effort normal. She has no rales.  Patient has some faint expiratory wheezes. No  retractions. Symmetric breath sounds.  Musculoskeletal: She exhibits no edema.          Assessment & Plan:  Acute bronchitis with mild reactive airway component-?viral. Prednisone taper with 40 mg daily for 3 days, 20 mg daily for 3 days, then 10 mg daily for 3 days. No antibiotic yet  but start Levaquin 500 milligrams once daily for 7 days if she develops any fever or has continued productive coughing over the next 3-4 days

## 2013-03-06 NOTE — Patient Instructions (Addendum)
Taper prednisone as discussed Start antibiotic if you have any fever or worsening respiratory symptoms.

## 2013-05-14 ENCOUNTER — Ambulatory Visit (INDEPENDENT_AMBULATORY_CARE_PROVIDER_SITE_OTHER): Payer: Medicare Other

## 2013-05-14 DIAGNOSIS — Z23 Encounter for immunization: Secondary | ICD-10-CM

## 2013-05-22 ENCOUNTER — Encounter: Payer: Self-pay | Admitting: Family Medicine

## 2013-05-22 ENCOUNTER — Ambulatory Visit (INDEPENDENT_AMBULATORY_CARE_PROVIDER_SITE_OTHER): Payer: Medicare Other | Admitting: Family Medicine

## 2013-05-22 VITALS — BP 140/80

## 2013-05-22 DIAGNOSIS — L500 Allergic urticaria: Secondary | ICD-10-CM

## 2013-05-22 MED ORDER — PREDNISONE 20 MG PO TABS: ORAL_TABLET | ORAL | Status: DC

## 2013-05-22 NOTE — Progress Notes (Signed)
  Subjective:    Patient ID: Taylor Blankenship, female    DOB: August 08, 1928, 79 y.o.   MRN: [id number]  HPI Taylor Blankenship is an 79 year old female who comes in today accompanied by her daughter for evaluation of a skin rash  She's not sure how long it's been there. It involves her abdomen is red swollen and periodic. She does have a history of allergic rhinitis  She was here in my absence and saw Dr. Sharman Cheek for bronchitis. She was given Levaquin which she never got filled. She got over her bronchitis but she continued to have changes in her voice. She went to see her ENT Dr. Jenne Pane. Upper airway exam was normal except for a vocal nodule.   Review of Systems Review of systems otherwise negative    Objective:   Physical Exam Well-developed well-nourished female no acute distress examination of skin shows an erythematous area it involving the entire lower abdomen is raised red and very pleuritic       Assessment & Plan:  Urticaria EER G. unknown plan prednisone burst and taper

## 2013-05-22 NOTE — Patient Instructions (Signed)
Prednisone 20 mg,,,,,,,,,,, 2 tabs x3 days or until you see a significant improvement in the rash,,,,,,,, then taper as outlined  You can try Tylenol PM at bedtime to help with sleep

## 2013-10-07 ENCOUNTER — Other Ambulatory Visit: Payer: Self-pay | Admitting: *Deleted

## 2013-10-07 DIAGNOSIS — I10 Essential (primary) hypertension: Secondary | ICD-10-CM

## 2013-10-07 MED ORDER — ATENOLOL 12.5 MG HALF TABLET: 12.5000 mg | ORAL_TABLET | Freq: Every day | ORAL | Status: DC

## 2013-10-17 ENCOUNTER — Other Ambulatory Visit: Payer: Self-pay

## 2013-10-17 DIAGNOSIS — Z1231 Encounter for screening mammogram for malignant neoplasm of breast: Secondary | ICD-10-CM

## 2013-11-15 ENCOUNTER — Ambulatory Visit
Admission: RE | Admit: 2013-11-15 | Discharge: 2013-11-15 | Disposition: A | Discharge status: Home / Self Care | Payer: Medicare Other | Source: Ambulatory Visit

## 2013-11-15 DIAGNOSIS — Z1231 Encounter for screening mammogram for malignant neoplasm of breast: Secondary | ICD-10-CM

## 2014-01-06 ENCOUNTER — Encounter: Payer: Medicare Other | Admitting: Family Medicine

## 2014-01-07 ENCOUNTER — Encounter: Payer: Self-pay | Admitting: Family Medicine

## 2014-01-07 ENCOUNTER — Ambulatory Visit (INDEPENDENT_AMBULATORY_CARE_PROVIDER_SITE_OTHER): Payer: Medicare Other | Admitting: Family Medicine

## 2014-01-07 VITALS — BP 140/80 | Temp 98.3°F | Ht 63.5 in | Wt 190.0 lb

## 2014-01-07 DIAGNOSIS — Z23 Encounter for immunization: Secondary | ICD-10-CM

## 2014-01-07 DIAGNOSIS — R002 Palpitations: Secondary | ICD-10-CM

## 2014-01-07 DIAGNOSIS — K2100 Gastro-esophageal reflux disease with esophagitis, without bleeding: Secondary | ICD-10-CM

## 2014-01-07 DIAGNOSIS — H9110 Presbycusis, unspecified ear: Secondary | ICD-10-CM

## 2014-01-07 DIAGNOSIS — I10 Essential (primary) hypertension: Secondary | ICD-10-CM

## 2014-01-07 DIAGNOSIS — L500 Allergic urticaria: Secondary | ICD-10-CM

## 2014-01-07 DIAGNOSIS — B351 Tinea unguium: Secondary | ICD-10-CM

## 2014-01-07 LAB — CBC WITH DIFFERENTIAL/PLATELET
Basophils Absolute: 0 10*3/uL (ref 0.0–0.1)
Basophils Relative: 0.9 % (ref 0.0–3.0)
Eosinophils Absolute: 0.2 10*3/uL (ref 0.0–0.7)
Eosinophils Relative: 4.7 % (ref 0.0–5.0)
HCT: 40.4 % (ref 36.0–46.0)
Hemoglobin: 13.6 g/dL (ref 12.0–15.0)
Lymphocytes Relative: 37.5 % (ref 12.0–46.0)
Lymphs Abs: 1.9 10*3/uL (ref 0.7–4.0)
MCHC: 33.6 g/dL (ref 30.0–36.0)
MCV: 92.2 fl (ref 78.0–100.0)
Monocytes Absolute: 0.6 10*3/uL (ref 0.1–1.0)
Monocytes Relative: 10.7 % (ref 3.0–12.0)
Neutro Abs: 2.4 10*3/uL (ref 1.4–7.7)
Neutrophils Relative %: 46.2 % (ref 43.0–77.0)
Platelets: 206 10*3/uL (ref 150.0–400.0)
RBC: 4.38 Mil/uL (ref 3.87–5.11)
RDW: 13.9 % (ref 11.5–15.5)
WBC: 5.2 10*3/uL (ref 4.0–10.5)

## 2014-01-07 LAB — BASIC METABOLIC PANEL
BUN: 16 mg/dL (ref 6–23)
CO2: 27 mEq/L (ref 19–32)
Calcium: 9.4 mg/dL (ref 8.4–10.5)
Chloride: 107 mEq/L (ref 96–112)
Creatinine, Ser: 0.9 mg/dL (ref 0.4–1.2)
GFR: 66.99 mL/min (ref >60.00)
Glucose, Bld: 89 mg/dL (ref 70–99)
Potassium: 4.5 mEq/L (ref 3.5–5.1)
Sodium: 141 mEq/L (ref 135–145)

## 2014-01-07 LAB — POCT URINALYSIS DIPSTICK
Bilirubin, UA: NEGATIVE
Blood, UA: NEGATIVE
Glucose, UA: NEGATIVE
Ketones, UA: NEGATIVE
Nitrite, UA: NEGATIVE
Protein, UA: NEGATIVE
Spec Grav, UA: 1.02
Urobilinogen, UA: 0.2
pH, UA: 5

## 2014-01-07 LAB — LIPID PANEL
Cholesterol: 198 mg/dL (ref 0–200)
HDL: 67.2 mg/dL (ref >39.00)
LDL Cholesterol: 105 mg/dL — ABNORMAL HIGH (ref 0–99)
NonHDL: 130.8
Total CHOL/HDL Ratio: 3
Triglycerides: 128 mg/dL (ref 0.0–149.0)
VLDL: 25.6 mg/dL (ref 0.0–40.0)

## 2014-01-07 LAB — TSH: TSH: 2.31 u[IU]/mL (ref 0.35–4.50)

## 2014-01-07 MED ORDER — ATENOLOL 25 MG PO TABS: ORAL_TABLET | ORAL | Status: DC

## 2014-01-07 MED ORDER — ATENOLOL 12.5 MG HALF TABLET: ORAL_TABLET | ORAL | Status: DC

## 2014-01-07 MED ORDER — TRIAMCINOLONE ACETONIDE 0.025 % EX OINT: TOPICAL_OINTMENT | CUTANEOUS | Status: DC

## 2014-01-07 MED ORDER — OMEPRAZOLE 20 MG PO CPDR: 20.0000 mg | DELAYED_RELEASE_CAPSULE | Freq: Every day | ORAL | Status: DC

## 2014-01-07 NOTE — Progress Notes (Signed)
   Subjective:    Patient ID: Taylor Blankenship, female    DOB: 04-03-1929, 80 y.o.   MRN: [id number]  HPI Taylor Blankenship is a 80 year old married female nonsmoker who comes in today accompanied by her daughter for evaluation of palpitations, reflux esophagitis,  Her palpitations are controlled by atenolol 12.5 mg daily  She's had trouble with her voice she went to see ENT showed one small nodule they did not think the nodule referred for voice loss. She has drainage and a lot of coughing. No history of allergies or asthma however she does have an allergic reaction her lower extremities. The rash there've been present for about a month.  She's had shingles 3 times and declines the shingles vaccine.  She gets routine eye care, dental care, and you mammography, last colonoscopy 10 years ago normal. Vaccinations updated by Apolonio Schneiders  Cognitive function normal she walks daily home health safety reviewed no issues identified, no guns in the house, she does have a health care power of attorney and living well    Review of Systems  Constitutional: Negative.   HENT: Negative.   Eyes: Negative.   Respiratory: Negative.   Cardiovascular: Negative.   Gastrointestinal: Negative.   Genitourinary: Negative.   Musculoskeletal: Negative.   Neurological: Negative.   Psychiatric/Behavioral: Negative.        Objective:   Physical Exam  Nursing note and vitals reviewed. Constitutional: She appears well-developed and well-nourished.  HENT:  Head: Normocephalic and atraumatic.  Right Ear: External ear normal.  Left Ear: External ear normal.  Nose: Nose normal.  Mouth/Throat: Oropharynx is clear and moist.  Eyes: EOM are normal. Pupils are equal, round, and reactive to light.  Neck: Normal range of motion. Neck supple. No thyromegaly present.  Cardiovascular: Normal rate, regular rhythm, normal heart sounds and intact distal pulses.  Exam reveals no gallop and no friction rub.   No murmur heard. No carotid  no aortic bruits peripheral pulses 2+ and symmetrical  PMI in the fifth intercostal space no murmurs  Pulmonary/Chest: Effort normal and breath sounds normal.  Abdominal: Soft. Bowel sounds are normal. She exhibits no distension and no mass. There is no tenderness. There is no rebound.  Genitourinary:  Bilateral breast exam normal  Musculoskeletal: Normal range of motion.  Lymphadenopathy:    She has no cervical adenopathy.  Neurological: She is alert. She has normal reflexes. No cranial nerve deficit. She exhibits normal muscle tone. Coordination normal.  Skin: Skin is warm and dry.  Total body skin exam normal except for rash lower extremities between the knees and the feet erythematous macules very. Neck  Psychiatric: She has a normal mood and affect. Her behavior is normal. Judgment and thought content normal.          Assessment & Plan:  Healthy female  Palpitations continue Tenormin 12.5 mg daily  Symptoms of reflux,,,,,,, and tied reflux program plus Prilosec,,,,, GI consult if symptoms persist  Hearing loss continue to wear hearing aids  Allergic rash lower extremities moisturizing cream in the triamcinolone  Hypertension at goal continue current therapy,

## 2014-01-07 NOTE — Patient Instructions (Signed)
Apply small amounts of udder cream and triamcinolone gel twice daily to the rash on your lower extremities  Reflux esophagitis program........... Prilosec 20 mg twice daily for 1 week then one tablet daily in the morning  Nothing to eat or drink for 3 hours before bedtime  2 pillows  No caffeine after noon  If symptoms persist or do not improve I would then recommend a GI consult  Continue daily walking

## 2014-01-08 ENCOUNTER — Telehealth: Payer: Self-pay | Admitting: Family Medicine

## 2014-01-08 NOTE — Telephone Encounter (Signed)
Relevant patient education assigned to patient using Emmi. ° °

## 2014-03-31 ENCOUNTER — Telehealth: Payer: Self-pay | Admitting: Family Medicine

## 2014-03-31 DIAGNOSIS — R21 Rash and other nonspecific skin eruption: Secondary | ICD-10-CM

## 2014-03-31 NOTE — Telephone Encounter (Signed)
Dermatology per Dr Sherren Mocha. Daughter is aware and referral placed.

## 2014-03-31 NOTE — Telephone Encounter (Signed)
Pt decline appt. Pt daughter would like rachel to return her call mom has itching.

## 2014-03-31 NOTE — Telephone Encounter (Signed)
Patient is itching all over.  She has tried OTC with some relief.  Daughter would like to know if she should take her Mom to an allergist? [facility]

## 2014-05-27 ENCOUNTER — Ambulatory Visit (INDEPENDENT_AMBULATORY_CARE_PROVIDER_SITE_OTHER): Payer: Medicare Other

## 2014-05-27 DIAGNOSIS — Z23 Encounter for immunization: Secondary | ICD-10-CM

## 2014-11-03 ENCOUNTER — Other Ambulatory Visit: Payer: Self-pay

## 2014-11-03 DIAGNOSIS — Z1231 Encounter for screening mammogram for malignant neoplasm of breast: Secondary | ICD-10-CM

## 2014-11-18 ENCOUNTER — Ambulatory Visit
Admission: RE | Admit: 2014-11-18 | Discharge: 2014-11-18 | Disposition: A | Discharge status: Home / Self Care | Payer: Medicare Other | Source: Ambulatory Visit

## 2014-11-18 DIAGNOSIS — Z1231 Encounter for screening mammogram for malignant neoplasm of breast: Secondary | ICD-10-CM

## 2015-01-26 ENCOUNTER — Other Ambulatory Visit: Payer: Self-pay | Admitting: *Deleted

## 2015-01-26 DIAGNOSIS — I10 Essential (primary) hypertension: Secondary | ICD-10-CM

## 2015-01-26 MED ORDER — ATENOLOL 25 MG PO TABS: ORAL_TABLET | ORAL | Status: DC

## 2015-04-08 ENCOUNTER — Other Ambulatory Visit (INDEPENDENT_AMBULATORY_CARE_PROVIDER_SITE_OTHER): Payer: Medicare Other

## 2015-04-08 DIAGNOSIS — D649 Anemia, unspecified: Secondary | ICD-10-CM

## 2015-04-08 DIAGNOSIS — I10 Essential (primary) hypertension: Secondary | ICD-10-CM

## 2015-04-08 DIAGNOSIS — E785 Hyperlipidemia, unspecified: Secondary | ICD-10-CM | POA: Diagnosis not present

## 2015-04-08 DIAGNOSIS — E039 Hypothyroidism, unspecified: Secondary | ICD-10-CM

## 2015-04-08 LAB — POCT URINALYSIS DIPSTICK
Bilirubin, UA: NEGATIVE
Blood, UA: NEGATIVE
Glucose, UA: NEGATIVE
Ketones, UA: NEGATIVE
Nitrite, UA: NEGATIVE
Protein, UA: NEGATIVE
Spec Grav, UA: 1.02
Urobilinogen, UA: 0.2
pH, UA: 5.5

## 2015-04-08 LAB — CBC WITH DIFFERENTIAL/PLATELET
Basophils Absolute: 0 10*3/uL (ref 0.0–0.1)
Basophils Relative: 0.7 % (ref 0.0–3.0)
Eosinophils Absolute: 0.4 10*3/uL (ref 0.0–0.7)
Eosinophils Relative: 5.8 % — ABNORMAL HIGH (ref 0.0–5.0)
HCT: 42.7 % (ref 36.0–46.0)
Hemoglobin: 14.2 g/dL (ref 12.0–15.0)
Lymphocytes Relative: 34.9 % (ref 12.0–46.0)
Lymphs Abs: 2.3 10*3/uL (ref 0.7–4.0)
MCHC: 33.2 g/dL (ref 30.0–36.0)
MCV: 92.6 fl (ref 78.0–100.0)
Monocytes Absolute: 0.6 10*3/uL (ref 0.1–1.0)
Monocytes Relative: 8.5 % (ref 3.0–12.0)
Neutro Abs: 3.3 10*3/uL (ref 1.4–7.7)
Neutrophils Relative %: 50.1 % (ref 43.0–77.0)
Platelets: 226 10*3/uL (ref 150.0–400.0)
RBC: 4.62 Mil/uL (ref 3.87–5.11)
RDW: 13.5 % (ref 11.5–15.5)
WBC: 6.6 10*3/uL (ref 4.0–10.5)

## 2015-04-08 LAB — LIPID PANEL
Cholesterol: 182 mg/dL (ref 0–200)
HDL: 51.7 mg/dL (ref >39.00)
LDL Cholesterol: 92 mg/dL (ref 0–99)
NonHDL: 130.29
Total CHOL/HDL Ratio: 4
Triglycerides: 192 mg/dL — ABNORMAL HIGH (ref 0.0–149.0)
VLDL: 38.4 mg/dL (ref 0.0–40.0)

## 2015-04-08 LAB — HEPATIC FUNCTION PANEL
ALT: 20 U/L (ref 0–35)
AST: 23 U/L (ref 0–37)
Albumin: 4 g/dL (ref 3.5–5.2)
Alkaline Phosphatase: 73 U/L (ref 39–117)
Bilirubin, Direct: 0.1 mg/dL (ref 0.0–0.3)
Total Bilirubin: 0.7 mg/dL (ref 0.2–1.2)
Total Protein: 7.1 g/dL (ref 6.0–8.3)

## 2015-04-08 LAB — BASIC METABOLIC PANEL
BUN: 16 mg/dL (ref 6–23)
CO2: 31 mEq/L (ref 19–32)
Calcium: 9.4 mg/dL (ref 8.4–10.5)
Chloride: 105 mEq/L (ref 96–112)
Creatinine, Ser: 0.83 mg/dL (ref 0.40–1.20)
GFR: 69.59 mL/min (ref >60.00)
Glucose, Bld: 93 mg/dL (ref 70–99)
Potassium: 5.1 mEq/L (ref 3.5–5.1)
Sodium: 142 mEq/L (ref 135–145)

## 2015-04-08 LAB — TSH: TSH: 3.28 u[IU]/mL (ref 0.35–4.50)

## 2015-04-13 ENCOUNTER — Ambulatory Visit (INDEPENDENT_AMBULATORY_CARE_PROVIDER_SITE_OTHER): Payer: Medicare Other | Admitting: Family Medicine

## 2015-04-13 ENCOUNTER — Encounter: Payer: Self-pay | Admitting: Family Medicine

## 2015-04-13 VITALS — BP 110/80 | Temp 98.3°F | Ht 62.0 in | Wt 180.0 lb

## 2015-04-13 DIAGNOSIS — Z0000 Encounter for general adult medical examination without abnormal findings: Secondary | ICD-10-CM | POA: Diagnosis not present

## 2015-04-13 DIAGNOSIS — I10 Essential (primary) hypertension: Secondary | ICD-10-CM

## 2015-04-13 DIAGNOSIS — Z23 Encounter for immunization: Secondary | ICD-10-CM | POA: Diagnosis not present

## 2015-04-13 MED ORDER — PREDNISONE 20 MG PO TABS: ORAL_TABLET | ORAL | Status: DC

## 2015-04-13 NOTE — Patient Instructions (Signed)
Continue your current medications the Tenormin 50 mg one half tab daily  If you have a flare of as we discussed........Taylor Blankenship prednisone 20 mg........Taylor Blankenship 1 tab 3 days, half a tab 3 days, then a half a tab Monday Wednesday Friday for a two-week taper  Return in one year sooner if any problems  Hemoccults 3 return them to the office

## 2015-04-13 NOTE — Progress Notes (Signed)
   Subjective:    Patient ID: Taylor Blankenship, female    DOB: May 21, 1929, 81 y.o.   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 She takes good care of her mom and dad. Her mom and dad still lives independently.  Rogene takes 12.5 mg of Tenormin daily for hypertension BP 110/80. She takes calcium vitamin D and uses Kenalog when necessary for skin rashes.  She gets routine eye care, dental care, does not do BSE monthly however does get annual mammography. She's had 3 or 4 colonoscopies are normal. We discussed the pluses and minuses of repeat colonoscopy as a screening test for colon cancer at age 73. She does have a positive family history of colon cancer her mother. She is disinclined to have any more screening test. I recommend she at least do the Hemoccult 3 on a yearly basis. She agrees to that.  Vaccinations up-to-date  Cognitive function normal she goes to the gym daily home health safety reviewed no issues identified, no guns in the house, she does have a healthcare power of attorney and living well   Review of Systems  Constitutional: Negative.   HENT: Negative.   Eyes: Negative.   Respiratory: Negative.   Cardiovascular: Negative.   Gastrointestinal: Negative.   Endocrine: Negative.   Genitourinary: Negative.   Musculoskeletal: Negative.   Skin: Negative.   Allergic/Immunologic: Negative.   Neurological: Negative.   Hematological: Negative.   Psychiatric/Behavioral: Negative.        Objective:   Physical Exam  Constitutional: She appears well-developed and well-nourished.  HENT:  Head: Normocephalic and atraumatic.  Right Ear: External ear normal.  Left Ear:  External ear normal.  Nose: Nose normal.  Mouth/Throat: Oropharynx is clear and moist.  Eyes: EOM are normal. Pupils are equal, round, and reactive to light.  Neck: Normal range of motion. Neck supple. No JVD present. No tracheal deviation present. No thyromegaly present.  Cardiovascular: Normal rate, regular rhythm, normal heart sounds and intact distal pulses.  Exam reveals no gallop and no friction rub.   No murmur heard. Pulmonary/Chest: Effort normal and breath sounds normal. No stridor. No respiratory distress. She has no wheezes. She has no rales. She exhibits no tenderness.  Abdominal: Soft. Bowel sounds are normal. She exhibits no distension and no mass. There is no tenderness. There is no rebound and no guarding.  Musculoskeletal: Normal range of motion.  Lymphadenopathy:    She has no cervical adenopathy.  Neurological: She is alert. She has normal reflexes. No cranial nerve deficit. She exhibits normal muscle tone. Coordination normal.  Skin: Skin is warm and dry. No rash noted. No erythema. No pallor.  Psychiatric: She has a normal mood and affect. Her behavior is normal. Judgment and thought content normal.  Nursing note and vitals reviewed.         Assessment & Plan:  Healthy female  Mild hypertension......... continue Tenormin 12.5 mg daily.

## 2015-04-14 NOTE — Progress Notes (Signed)
   Subjective:    Patient ID: Taylor Blankenship, female    DOB: 1929-07-19, 81 y.o.   MRN: [id number]  HPI    Review of Systems     Objective:   Physical Exam        Assessment & Plan:  EKG within normal limits

## 2015-05-01 ENCOUNTER — Other Ambulatory Visit (INDEPENDENT_AMBULATORY_CARE_PROVIDER_SITE_OTHER): Payer: Medicare Other

## 2015-05-01 DIAGNOSIS — K921 Melena: Secondary | ICD-10-CM

## 2015-05-01 LAB — POC HEMOCCULT BLD/STL (HOME/3-CARD/SCREEN)
Card #2 Fecal Occult Blod, POC: NEGATIVE
Card #3 Fecal Occult Blood, POC: NEGATIVE
Fecal Occult Blood, POC: NEGATIVE

## 2015-08-04 ENCOUNTER — Encounter: Payer: Self-pay | Admitting: Internal Medicine

## 2015-08-04 ENCOUNTER — Telehealth: Payer: Self-pay | Admitting: Family Medicine

## 2015-08-04 ENCOUNTER — Ambulatory Visit (INDEPENDENT_AMBULATORY_CARE_PROVIDER_SITE_OTHER): Payer: Medicare Other | Admitting: Internal Medicine

## 2015-08-04 VITALS — BP 144/84 | Temp 98.7°F | Ht 62.0 in

## 2015-08-04 DIAGNOSIS — H019 Unspecified inflammation of eyelid: Secondary | ICD-10-CM | POA: Diagnosis not present

## 2015-08-04 MED ORDER — ERYTHROMYCIN 5 MG/GM OP OINT: 1.0000 "application " | TOPICAL_OINTMENT | Freq: Four times a day (QID) | OPHTHALMIC | Status: DC

## 2015-08-04 NOTE — Telephone Encounter (Signed)
Patient Name: Taylor Blankenship  DOB: Dec 27, 1928    Initial Comment Caller states mother has a swollen right eye; has been happening;    Nurse Assessment      Guidelines    Guideline Title Affirmed Question Affirmed Notes  Eye - Red Without Pus Eye pain present > 24 hours    Final Disposition User   See Physician within 24 Hours Atkins, RN, Jacquilin    Disagree/Comply: Comply   Appointment made today at 1600 with Dr. Regis Bill

## 2015-08-04 NOTE — Patient Instructions (Signed)
This acts like eye lid infection and or plugged  Gland    Warm compresses .   And then antibiotic  Ointment   As directed Expect improvement in the  Next 2-3 days . If fever.   Of    persistent or progressive then consider seeing your eye doctor .

## 2015-08-04 NOTE — Telephone Encounter (Signed)
noted 

## 2015-08-04 NOTE — Progress Notes (Signed)
Pre visit review using our clinic review tool, if applicable. No additional management support is needed unless otherwise documented below in the visit note.  Chief Complaint  Patient presents with  . Right Eyelid Swelling    HPI: Patient Taylor Blankenship  comes in today for SDA for  new problem evaluation. Here with daughter  PCP NA  Onset  One day  Right  Upper and lower eyelid swelling and soreness noted this am    In December had same on lower lid x 2 lasted  2-3 days and  Went away  Right eye  No drainage .   2 - 3 days .  Upper lid and worse this time  Contacts [facility] doctor  ;l;ocal no eye disease had cataract surgery  Has allergies  Hoarseness no fever vision change  Vision fever  ROS: See pertinent positives and negatives per HPI.no cough rash  Has a bump on eye lid  Not new  Past Medical History  Diagnosis Date  . Actinic keratosis   . ASTHMA   . Dermatophytosis of nail   . HYPERTENSION   . OSTEOPOROSIS   . PALPITATIONS, RECURRENT   . DJD (degenerative joint disease)   . History of shingles     Family History  Problem Relation Age of Onset  . Arthritis Other   . Heart disease Other   . Cancer Other     colon  . Diabetes Other     Social History   Social History  . Marital Status: Married    Spouse Name: N/A  . Number of Children: N/A  . Years of Education: N/A   Social History Main Topics  . Smoking status: Former Research scientist (life sciences)  . Smokeless tobacco: None  . Alcohol Use: Yes  . Drug Use: No  . Sexual Activity: Not Asked   Other Topics Concern  . None   Social History Narrative    Outpatient Prescriptions Prior to Visit  Medication Sig Dispense Refill  . atenolol (TENORMIN) 25 MG tablet Half tab daily 100 tablet 3  . Calcium Carbonate (CALCIUM 600) 1500 MG TABS Take by mouth daily. As directed     . Cholecalciferol (VITAMIN D) 1000 UNITS capsule Take 1,000 Units by mouth daily.      . Multiple Vitamin (MULTIVITAMIN) tablet Take 1 tablet by mouth  daily.      . Omega-3 Fatty Acids (FISH OIL) 300 MG CAPS Take by mouth daily.      Marland Kitchen triamcinolone (KENALOG) 0.025 % ointment Apply small amounts twice daily 30 g 3  . omeprazole (PRILOSEC) 20 MG capsule Take 1 capsule (20 mg total) by mouth daily. (Patient not taking: Reported on 08/04/2015) 100 capsule 3  . predniSONE (DELTASONE) 20 MG tablet , 1 tab x 3 days, 1/2 tab x 3 days, 1/2 tab M,W,F x 2 weeks (Patient not taking: Reported on 08/04/2015) 40 tablet 1   No facility-administered medications prior to visit.     EXAM:  BP 144/84 mmHg  Temp(Src) 98.7 F (37.1 C) (Oral)  Ht '5\' 2"'$  (1.575 m)  Wt   There is no weight on file to calculate BMI.  GENERAL: vitals reviewed and listed above, alert, oriented, appears well hydrated and in no acute distress w obvious mild upper lid swelling and pink  Pinpoint pustlul like no dic  No crusting  Bulbar conj is clear eoms clear   Upper l;id red  No dc  Lower lid puffy  No sinus tenderness .  Tm clear ofnright ( hearing aids ) midly hoarse  NECK: no obvious masses on inspection palpation  PSYCH: pleasant and cooperative, no obvious depression or anxiety  ASSESSMENT AND PLAN:  Discussed the following assessment and plan:  Eyelid inflammation - vs internal stye vs other sx   soreness face getting better  empiric rx compresses add erytho oint  fu eye doc if ongoing   -Patient advised to return or notify health care team  if symptoms worsen ,persist or new concerns arise.  Patient Instructions  This acts like eye lid infection and or plugged  Gland    Warm compresses .   And then antibiotic  Ointment   As directed Expect improvement in the  Next 2-3 days . If fever.   Of    persistent or progressive then consider seeing your eye doctor .     Standley Brooking. Candus Braud M.D.

## 2015-08-05 MED FILL — ERYTHROMYCIN EYE OINTMENT: 5 | 10 days supply | Qty: 4 | Fill #0

## 2015-09-24 ENCOUNTER — Other Ambulatory Visit: Payer: Self-pay | Admitting: Family Medicine

## 2015-09-25 ENCOUNTER — Other Ambulatory Visit: Payer: Self-pay

## 2015-09-25 MED ORDER — ATENOLOL 25 MG PO TABS: ORAL_TABLET | ORAL | Status: DC

## 2015-09-25 MED FILL — ATENOLOL 25 MG TABLET: 25 | 90 days supply | Qty: 90 | Fill #0

## 2015-10-05 MED FILL — predniSONE 20 MG TABS: 20 | 37 days supply | Qty: 8 | Fill #1

## 2015-10-26 ENCOUNTER — Other Ambulatory Visit: Payer: Self-pay

## 2015-10-26 DIAGNOSIS — Z1231 Encounter for screening mammogram for malignant neoplasm of breast: Secondary | ICD-10-CM

## 2015-10-29 ENCOUNTER — Ambulatory Visit (INDEPENDENT_AMBULATORY_CARE_PROVIDER_SITE_OTHER): Payer: Medicare Other | Admitting: Family Medicine

## 2015-10-29 ENCOUNTER — Encounter: Payer: Self-pay | Admitting: Family Medicine

## 2015-10-29 ENCOUNTER — Telehealth: Payer: Self-pay | Admitting: Family Medicine

## 2015-10-29 VITALS — BP 130/68 | HR 78 | Temp 98.5°F | Wt 180.0 lb

## 2015-10-29 DIAGNOSIS — R21 Rash and other nonspecific skin eruption: Secondary | ICD-10-CM

## 2015-10-29 DIAGNOSIS — B351 Tinea unguium: Secondary | ICD-10-CM

## 2015-10-29 DIAGNOSIS — R17 Unspecified jaundice: Secondary | ICD-10-CM | POA: Diagnosis not present

## 2015-10-29 LAB — CBC WITH DIFFERENTIAL/PLATELET
Basophils Absolute: 0.1 10*3/uL (ref 0.0–0.1)
Basophils Relative: 1.1 % (ref 0.0–3.0)
Eosinophils Absolute: 0.3 10*3/uL (ref 0.0–0.7)
Eosinophils Relative: 4.7 % (ref 0.0–5.0)
HCT: 41 % (ref 36.0–46.0)
Hemoglobin: 13.7 g/dL (ref 12.0–15.0)
Lymphocytes Relative: 31.3 % (ref 12.0–46.0)
Lymphs Abs: 2.1 10*3/uL (ref 0.7–4.0)
MCHC: 33.5 g/dL (ref 30.0–36.0)
MCV: 91.1 fl (ref 78.0–100.0)
Monocytes Absolute: 0.7 10*3/uL (ref 0.1–1.0)
Monocytes Relative: 10.5 % (ref 3.0–12.0)
Neutro Abs: 3.5 10*3/uL (ref 1.4–7.7)
Neutrophils Relative %: 52.4 % (ref 43.0–77.0)
Platelets: 213 10*3/uL (ref 150.0–400.0)
RBC: 4.5 Mil/uL (ref 3.87–5.11)
RDW: 13.9 % (ref 11.5–15.5)
WBC: 6.7 10*3/uL (ref 4.0–10.5)

## 2015-10-29 LAB — COMPREHENSIVE METABOLIC PANEL
ALT: 22 U/L (ref 0–35)
AST: 26 U/L (ref 0–37)
Albumin: 4.2 g/dL (ref 3.5–5.2)
Alkaline Phosphatase: 63 U/L (ref 39–117)
BUN: 17 mg/dL (ref 6–23)
CO2: 30 mEq/L (ref 19–32)
Calcium: 9.8 mg/dL (ref 8.4–10.5)
Chloride: 105 mEq/L (ref 96–112)
Creatinine, Ser: 0.87 mg/dL (ref 0.40–1.20)
GFR: 65.82 mL/min (ref >60.00)
Glucose, Bld: 90 mg/dL (ref 70–99)
Potassium: 4.4 mEq/L (ref 3.5–5.1)
Sodium: 141 mEq/L (ref 135–145)
Total Bilirubin: 0.9 mg/dL (ref 0.2–1.2)
Total Protein: 6.9 g/dL (ref 6.0–8.3)

## 2015-10-29 MED ORDER — TRIAMCINOLONE ACETONIDE 0.025 % EX OINT: TOPICAL_OINTMENT | CUTANEOUS | Status: DC

## 2015-10-29 MED FILL — TRIAMCINOLONE 0.025% OINT: 0.025 | 30 days supply | Qty: 30 | Fill #0

## 2015-10-29 NOTE — Patient Instructions (Signed)
Keba refilled the stronger cream for you  You declined another round of prednisone as well as dermatology referral  Not sure why the voice remains hoarse- could consider ENT follow up  Make sure liver is ok by checking bloodwork before you leave  If you change your mind on the above please let us know. Follow up for new or worsening symptoms

## 2015-10-29 NOTE — Telephone Encounter (Signed)
Pt coming in to see Dr. Yong Channel today.

## 2015-10-29 NOTE — Telephone Encounter (Signed)
FYI

## 2015-10-29 NOTE — Telephone Encounter (Signed)
Patient Name: Taylor Blankenship  DOB: [birth year]    Initial Comment caller states mother has patches of itchy skin and yellowing of the eyes   Nurse Assessment  Nurse: Mallie Mussel, RN, Alveta Heimlich Date/Time Eilene Ghazi Time): 10/29/2015 9:04:23 AM  Confirm and document reason for call. If symptomatic, describe symptoms. You must click the next button to save text entered. ---Caller states that she has" breaking out" on her skin. Its mostly on her back from the top to below her bra line. The rash is moderately itchy. She rates the rash as pinkish flat and rough. She has had this off and on for the past 6 weeks. She was prescribed prednisone before and it helped. She states that her voice is "going flunky again too." The prednisone had helped with that also. Her father had noticed that her eyes are beginning to turn a "shading" of yellow when she was on the prednisone a week ago. At present, caller states that "they're not a clear white as I'd like to see them." Denies fever.  Has the patient traveled out of the country within the last 30 days? ---No  Does the patient have any new or worsening symptoms? ---Yes  Will a triage be completed? ---Yes  Related visit to physician within the last 2 weeks? ---Yes  Does the PT have any chronic conditions? (i.e. diabetes, asthma, etc.) ---Yes  List chronic conditions. ---Asthma, HTN  Is this a behavioral health or substance abuse call? ---No     Guidelines    Guideline Title Affirmed Question Affirmed Notes  Rash or Redness - Localized Localized rash present > 7 days    Final Disposition User   See PCP When Office is Open (within 3 days) Mallie Mussel, RN, Alveta Heimlich    Comments  Caller really wanted her to be seen today if at all possible. Dr. Sherren Mocha was not in. I checked the NP's and none of them had any appointments for today. I was able to schedule her to see Dr. Garret Reddish today at 1:00pm.   Referrals  REFERRED TO PCP OFFICE   Disagree/Comply: Comply

## 2015-10-29 NOTE — Progress Notes (Signed)
Taylor Reddish, MD  Subjective:  Taylor Blankenship is a 82 y.o. year old very pleasant female patient who presents for/with See problem oriented charting ROS- see ros below  Past Medical History-  Patient Active Problem List   Diagnosis Date Noted  . Allergic urticaria 05/22/2013  . Family history of colon cancer 01/03/2013  . Hearing loss of aging 01/03/2013  . ACTINIC KERATOSIS 10/07/2009  . ASTHMA 10/02/2009  . DERMATOPHYTOSIS OF NAIL 09/30/2008  . PALPITATIONS, RECURRENT 09/17/2007  . Essential hypertension 05/28/2007  . OSTEOPOROSIS 05/28/2007    Medications- reviewed and updated Current Outpatient Prescriptions  Medication Sig Dispense Refill  . atenolol (TENORMIN) 25 MG tablet Take 1/2 to 1 tablet by mouth daily. 100 tablet 3  . Calcium Carbonate (CALCIUM 600) 1500 MG TABS Take by mouth daily. As directed     . Cholecalciferol (VITAMIN D) 1000 UNITS capsule Take 1,000 Units by mouth daily.      Marland Kitchen erythromycin ophthalmic ointment Place 1 application into the right eye 4 (four) times daily. After compresses 3.5 g 0  . Multiple Vitamin (MULTIVITAMIN) tablet Take 1 tablet by mouth daily.      . Omega-3 Fatty Acids (FISH OIL) 300 MG CAPS Take by mouth daily.      Marland Kitchen omeprazole (PRILOSEC) 20 MG capsule Take 1 capsule (20 mg total) by mouth daily. (Patient not taking: Reported on 08/04/2015) 100 capsule 3  . predniSONE (DELTASONE) 20 MG tablet , 1 tab x 3 days, 1/2 tab x 3 days, 1/2 tab M,W,F x 2 weeks (Patient not taking: Reported on 08/04/2015) 40 tablet 1   No current facility-administered medications for this visit.    Objective: BP 130/68 mmHg  Pulse 78  Temp(Src) 98.5 F (36.9 C)  Wt 180 lb (81.647 kg) Gen: NAD, resting comfortably Does have slight scleral icterus/yellowing CV: RRR no murmurs rubs or gallops Lungs: CTAB no crackles, wheeze, rhonchi Skin: warm, dry, erythematous plaques in several areas on her upper back and onto her neck, no scale associated. No obvious  excoriation. No jaundice.  Neuro: grossly normal, moves all extremities        Assessment/Plan:  Rash Scleral icterus S: Rash on upper back started with 3-6 weeks and also extends onto the neck. Reports mild pruritis- hydrocortisone cream helps. Called [facility]/Dr. Sherren Mocha and was advised could trial 2 week prednisone taper. Mild improvement on this and finished in recent days. While on prednisone developed a yellowing of her eyes per husband. They waited to see if it would improve off medicine but it has not.  ROS-not ill appearing, no fever/chills. No new medications. Not immunocompromised. No mucus membrane involvement. Does complain of some hoarseness which got temporarily better on prednisone. Had been wheezing but resolved on prednisone.  A/P: She has a history of several rashes- she is not very concerned about the rash. She states she is only here because family told her to come. She herself does not see yellowing. For rash- she declines prednisone trial again or dermatology referral. Does agree to bloodwork to check liver function which will be done today. Also refilled triamcinolone which has helped her in the past with various rashes.   Return precautions advised.   Orders Placed This Encounter  Procedures  . CBC with Differential/Platelet  . Comprehensive metabolic panel    [place]    Meds ordered this encounter  Medications  . triamcinolone (KENALOG) 0.025 % ointment    Sig: Apply small amounts twice daily    Dispense:  30 g    Refill:  3

## 2015-11-20 ENCOUNTER — Ambulatory Visit
Admission: RE | Admit: 2015-11-20 | Discharge: 2015-11-20 | Disposition: A | Discharge status: Home / Self Care | Payer: Medicare Other | Source: Ambulatory Visit

## 2015-11-20 DIAGNOSIS — Z1231 Encounter for screening mammogram for malignant neoplasm of breast: Secondary | ICD-10-CM

## 2015-12-08 ENCOUNTER — Encounter: Payer: Self-pay | Admitting: Family Medicine

## 2015-12-08 ENCOUNTER — Ambulatory Visit (INDEPENDENT_AMBULATORY_CARE_PROVIDER_SITE_OTHER)
Admission: RE | Admit: 2015-12-08 | Discharge: 2015-12-08 | Disposition: A | Discharge status: Home / Self Care | Payer: Medicare Other | Source: Ambulatory Visit | Attending: Family Medicine | Admitting: Family Medicine

## 2015-12-08 ENCOUNTER — Ambulatory Visit (INDEPENDENT_AMBULATORY_CARE_PROVIDER_SITE_OTHER): Payer: Medicare Other | Admitting: Family Medicine

## 2015-12-08 VITALS — BP 110/80 | HR 70 | Temp 98.2°F | Wt 188.0 lb

## 2015-12-08 DIAGNOSIS — R0602 Shortness of breath: Secondary | ICD-10-CM

## 2015-12-08 DIAGNOSIS — J4531 Mild persistent asthma with (acute) exacerbation: Secondary | ICD-10-CM

## 2015-12-08 DIAGNOSIS — R011 Cardiac murmur, unspecified: Secondary | ICD-10-CM

## 2015-12-08 LAB — POCT URINALYSIS DIPSTICK
Bilirubin, UA: NEGATIVE
Blood, UA: NEGATIVE
Glucose, UA: NEGATIVE
Ketones, UA: NEGATIVE
Nitrite, UA: NEGATIVE
Protein, UA: NEGATIVE
Spec Grav, UA: 1.015
Urobilinogen, UA: 0.2
pH, UA: 6

## 2015-12-08 LAB — HEPATIC FUNCTION PANEL
ALT: 27 U/L (ref 0–35)
AST: 32 U/L (ref 0–37)
Albumin: 4.3 g/dL (ref 3.5–5.2)
Alkaline Phosphatase: 67 U/L (ref 39–117)
Bilirubin, Direct: 0.2 mg/dL (ref 0.0–0.3)
Total Bilirubin: 1.1 mg/dL (ref 0.2–1.2)
Total Protein: 7.3 g/dL (ref 6.0–8.3)

## 2015-12-08 LAB — TSH: TSH: 3.89 u[IU]/mL (ref 0.35–4.50)

## 2015-12-08 LAB — BRAIN NATRIURETIC PEPTIDE: Pro B Natriuretic peptide (BNP): 102 pg/mL — ABNORMAL HIGH (ref 0.0–100.0)

## 2015-12-08 MED ORDER — PREDNISONE 20 MG PO TABS: ORAL_TABLET | ORAL | Status: DC

## 2015-12-08 MED ORDER — HYDROCHLOROTHIAZIDE 12.5 MG PO TABS: 12.5000 mg | ORAL_TABLET | Freq: Every day | ORAL | Status: DC

## 2015-12-08 MED FILL — HYDROCHLOROTHIAZIDE 12.5 MG: 12.5 | 90 days supply | Qty: 90 | Fill #0

## 2015-12-08 MED FILL — predniSONE 20 MG TABS: 20 | 70 days supply | Qty: 40 | Fill #0

## 2015-12-08 NOTE — Patient Instructions (Signed)
Prednisone 20 mg......... 2 tabs 3 days until you feel significantly better then taper as outlined  Hydrocort thiazide 12.5 mg........Marland Kitchen 1 tab daily in the morning  Salt free diet  Go to the main office now for your chest x-ray  We will get you set up for an echocardiogram  Labs today  Only get all your studies back I will call you and arrange for further follow-up

## 2015-12-08 NOTE — Progress Notes (Signed)
   Subjective:    Patient ID: Linward Headland, female    DOB: 1929-06-24, 82 y.o.   MRN: [id number]  HPI Julio is a 82 year old married female nonsmoker who comes in today acutely for evaluation of shortness of breath for one year  She's had a history of asthma. About a year ago we gave her short course of prednisone. She took that her symptoms resolved. Since that time she's had intermittent wheezing. Today she decided to come in as an acute prompted by her daughter.  The wheezing is mild but persistent. It hasn't gotten worse the last couple months. It does seem to be worse in the wintertime. Again it did respond a short course of prednisone about a year ago. She's had no cardiac or GI symptoms although she has had some trace edema. She does not wake up at night short of breath. No weight loss chest pain etc. etc. She does have allergic rhinitis which he takes Zyrtec daily.   Review of Systems    negative Objective:   Physical Exam  Well-developed well-nourished female no acute distress vital signs stable she's afebrile HEENT were negative except for bilateral hearing aids. Neck was supple no adenopathy neuropathy no JVD. Pulmonary exam shows symmetrical breath sounds mild wheezing inspiratory and expiratory  Cardiac exam PMI is nonpalpable S1 and S2 within normal limits there is a murmur sounds like she has aortic stenosis.  1+ peripheral edema symmetrical      Assessment & Plan:  Asthma mild persistent...............Marland Kitchen restart prednisone........ pulmonary consult  Heart murmur.......... EKG and echocardiogram and chest x-ray  .

## 2015-12-08 NOTE — Progress Notes (Signed)
Pre visit review using our clinic review tool, if applicable. No additional management support is needed unless otherwise documented below in the visit note. 

## 2015-12-17 ENCOUNTER — Ambulatory Visit (HOSPITAL_COMMUNITY): Payer: Medicare Other | Attending: Cardiology

## 2015-12-17 ENCOUNTER — Other Ambulatory Visit: Payer: Self-pay

## 2015-12-17 DIAGNOSIS — R011 Cardiac murmur, unspecified: Secondary | ICD-10-CM | POA: Diagnosis not present

## 2015-12-17 DIAGNOSIS — I10 Essential (primary) hypertension: Secondary | ICD-10-CM | POA: Insufficient documentation

## 2015-12-17 DIAGNOSIS — Z87891 Personal history of nicotine dependence: Secondary | ICD-10-CM | POA: Insufficient documentation

## 2015-12-17 DIAGNOSIS — R0602 Shortness of breath: Secondary | ICD-10-CM | POA: Insufficient documentation

## 2015-12-17 DIAGNOSIS — I351 Nonrheumatic aortic (valve) insufficiency: Secondary | ICD-10-CM | POA: Insufficient documentation

## 2015-12-21 ENCOUNTER — Encounter: Payer: Self-pay | Admitting: Family Medicine

## 2015-12-22 NOTE — Telephone Encounter (Signed)
[phone] Daughter calling for results of echo. She thinks someone from our office called her, but cannot find who.

## 2015-12-24 NOTE — Telephone Encounter (Signed)
Please confirm daughter on DPR (i didn't look)  Echo shows mild regurgitation from aortic valve which may have caused murmur. This is a very mild issue.  Patient does show poor relaxation of the heart (diastolic dysfunction) but this is very very common for her age.   No major concerns unless patient is having any new or worsening symptoms but looks like was mainly done for murmur

## 2015-12-24 NOTE — Telephone Encounter (Signed)
Spoke to pt's husband, pt not available told him to have pt call tomorrow. Mr. Partin said he will give her the message.

## 2015-12-24 NOTE — Telephone Encounter (Signed)
Duplicate

## 2015-12-24 NOTE — Telephone Encounter (Signed)
Dr. Yong Channel, can you please review results of ECHO and advise due to Dr.[name] out of the office. Thanks

## 2015-12-25 NOTE — Telephone Encounter (Signed)
Pt called back, went over results of ECHO. Pt said Dr. Sherren Mocha called her and she was aware. Told her okay.

## 2016-01-08 ENCOUNTER — Other Ambulatory Visit: Payer: Self-pay | Admitting: *Deleted

## 2016-01-08 MED ORDER — BECLOMETHASONE DIPROPIONATE 40 MCG/ACT IN AERS: 2.0000 | INHALATION_SPRAY | Freq: Two times a day (BID) | RESPIRATORY_TRACT | Status: DC

## 2016-01-08 MED FILL — QVAR 40 MCG ORAL INHALER: 40 | 60 days supply | Qty: 17 | Fill #0

## 2016-01-08 NOTE — Telephone Encounter (Signed)
Spoke to pt's daughter told her Rx for Qvar sent to pharmacy as requested.

## 2016-03-03 MED FILL — HYDROCHLOROTHIAZIDE 12.5 MG: 12.5 | 90 days supply | Qty: 90 | Fill #1

## 2016-04-12 ENCOUNTER — Ambulatory Visit (INDEPENDENT_AMBULATORY_CARE_PROVIDER_SITE_OTHER): Payer: Medicare Other | Admitting: Family Medicine

## 2016-04-12 ENCOUNTER — Encounter: Payer: Self-pay | Admitting: Family Medicine

## 2016-04-12 VITALS — BP 110/70 | HR 72 | Temp 98.0°F | Ht 62.0 in | Wt 184.8 lb

## 2016-04-12 DIAGNOSIS — Z0000 Encounter for general adult medical examination without abnormal findings: Secondary | ICD-10-CM

## 2016-04-12 DIAGNOSIS — H9113 Presbycusis, bilateral: Secondary | ICD-10-CM | POA: Diagnosis not present

## 2016-04-12 DIAGNOSIS — R011 Cardiac murmur, unspecified: Secondary | ICD-10-CM

## 2016-04-12 DIAGNOSIS — I10 Essential (primary) hypertension: Secondary | ICD-10-CM | POA: Diagnosis not present

## 2016-04-12 DIAGNOSIS — M810 Age-related osteoporosis without current pathological fracture: Secondary | ICD-10-CM

## 2016-04-12 DIAGNOSIS — Z23 Encounter for immunization: Secondary | ICD-10-CM

## 2016-04-12 DIAGNOSIS — J4530 Mild persistent asthma, uncomplicated: Secondary | ICD-10-CM

## 2016-04-12 LAB — CBC WITH DIFFERENTIAL/PLATELET
Basophils Absolute: 0.1 10*3/uL (ref 0.0–0.1)
Basophils Relative: 0.9 % (ref 0.0–3.0)
Eosinophils Absolute: 0.4 10*3/uL (ref 0.0–0.7)
Eosinophils Relative: 6.8 % — ABNORMAL HIGH (ref 0.0–5.0)
HCT: 41.6 % (ref 36.0–46.0)
Hemoglobin: 14.2 g/dL (ref 12.0–15.0)
Lymphocytes Relative: 32.3 % (ref 12.0–46.0)
Lymphs Abs: 1.8 10*3/uL (ref 0.7–4.0)
MCHC: 34.1 g/dL (ref 30.0–36.0)
MCV: 91 fl (ref 78.0–100.0)
Monocytes Absolute: 0.6 10*3/uL (ref 0.1–1.0)
Monocytes Relative: 10 % (ref 3.0–12.0)
Neutro Abs: 2.8 10*3/uL (ref 1.4–7.7)
Neutrophils Relative %: 50 % (ref 43.0–77.0)
Platelets: 206 10*3/uL (ref 150.0–400.0)
RBC: 4.57 Mil/uL (ref 3.87–5.11)
RDW: 13.2 % (ref 11.5–15.5)
WBC: 5.6 10*3/uL (ref 4.0–10.5)

## 2016-04-12 LAB — BASIC METABOLIC PANEL
BUN: 16 mg/dL (ref 6–23)
CO2: 31 mEq/L (ref 19–32)
Calcium: 9.2 mg/dL (ref 8.4–10.5)
Chloride: 103 mEq/L (ref 96–112)
Creatinine, Ser: 0.83 mg/dL (ref 0.40–1.20)
GFR: 69.42 mL/min (ref >60.00)
Glucose, Bld: 96 mg/dL (ref 70–99)
Potassium: 3.8 mEq/L (ref 3.5–5.1)
Sodium: 138 mEq/L (ref 135–145)

## 2016-04-12 MED ORDER — HYDROCHLOROTHIAZIDE 12.5 MG PO TABS: 12.5000 mg | ORAL_TABLET | Freq: Every day | ORAL | 3 refills | Status: DC

## 2016-04-12 MED ORDER — ATENOLOL 25 MG PO TABS
ORAL_TABLET | ORAL | 3 refills | Status: DC
Start: 2016-04-12 — End: 2017-04-17

## 2016-04-12 MED ORDER — BECLOMETHASONE DIPROPIONATE 40 MCG/ACT IN AERS: 2.0000 | INHALATION_SPRAY | Freq: Two times a day (BID) | RESPIRATORY_TRACT | 6 refills | Status: DC

## 2016-04-12 MED ORDER — PREDNISONE 20 MG PO TABS: ORAL_TABLET | ORAL | 1 refills | Status: DC

## 2016-04-12 MED FILL — ATENOLOL 25 MG TABLET: 25 | 30 days supply | Qty: 30 | Fill #0

## 2016-04-12 MED FILL — QVAR 40 MCG ORAL INHALER: 40 | 60 days supply | Qty: 17 | Fill #0 | Status: TO

## 2016-04-12 NOTE — Patient Instructions (Signed)
Usual Qvar inhaler............ 2 puffs twice daily or year round  When your skin is miserable.......Marland Kitchen prednisone 20 mg........Marland Kitchen 1 tab for 3 days, half a tab for 3 days, then a half a tab Monday Wednesday Friday for 2 to three-week taper  If despite using the Qvar you develop wheezing restart the prednisone as outlined  For your knees I would recommend daily exercise, Motrin 400 mg twice daily, elevation and ice after you walk. Orthopedic consult with Dr. Matt:olin when necessary  Return in one year for general physical examination sooner if any problems

## 2016-04-12 NOTE — Progress Notes (Signed)
Pre visit review using our clinic review tool, if applicable. No additional management support is needed unless otherwise documented below in the visit note. 

## 2016-04-12 NOTE — Progress Notes (Signed)
Taylor Blankenship is a 82 year old married female nonsmoker who comes in today for general physical examination because of a history of underlying hypertension, allergic rhinitis and asthma, severe eczema, aortic insufficiency,  She says overall she feels well she has no major complaints. Her allergies are beginning to bother her now like to do every September. She takes a half of his Zyrtec daily and is stopped her Qvar. She was advised to use the Qvar 2 puffs twice daily or year round.  Her eczema is bothering her again. She seen dermatology and they've outlined a number things but nothing cures the problem. I explained there is no cure that is treatment.  Her blood pressure on Tenormin one half tab daily along with the Hydrocort thiazide 12.5 daily is 110/70. Pulse is 70 and regular. She does take calcium vitamin D and goes to the gym daily because a history of osteoarthritis and osteopenia. Her knees is so bad that she can't walk anymore. Right knee is worse in the left. We talked about symptomatic treatment with ice Motrin and exercise and weight loss.  She gets routine eye care.......Marland Kitchen bilateral cataract removal and lens implants by ophthalmology........Marland Kitchen regular dental care, mammogram 4 2017 normal. Previous colonoscopy is well been normal no GI symptoms or for colonoscopy and rectal exam will not be repeated.  Review of systems otherwise negative  Flu shot and tetanus boosters given today she declines a singles vaccine. Also declines having spirometry. In the DEXA scan. She had an echocardiogram in the spring which showed some AI otherwise normal. Cardiovascular-wise she stable  Cognitive function normal she exercises daily at the gym, home health safety reviewed no issues identified, no guns in the house, she does have a healthcare power of attorney and living well  Physical examination vital signs stable she's afebrile except for weight 184. HEENT were negative except she's had both cataracts removed  and lens implants. She has bilateral hearing aids that she got from [facility]. Neck was supple no adenopathy thyroid normal no carotid bruits cardiopulmonary exam normal except for some mild wheezing on forced expiration. She has a slight murmur of AI unchanged since previous exam. Abdominal exam normal extremities normal skin no peripheral pulses normal except for severe dry skin.  Impression  #1 hypertension at goal........ continue current therapy  #2 asthma...... again encouraged use her inhaler 2 puffs twice daily or year round  #3 eczema........ outlined total skin program and FLAIR M burst of prednisone when his miserable. Advised 20 mg daily for 3 days 10 for 3 and then 10 Monday Wednesday Friday for 2 to three-week taper  #4 overweight........ again advised diet exercise and weight loss  #5 degenerative joint disease right and left knee.......Marland Kitchen weight loss exercise Motrin 400 twice a day ice. Declines any further evaluation at this juncture.  #6 bilateral cataract lens implants.....Marland Kitchen follow-up with ophthalmology yearly

## 2016-06-01 MED FILL — HYDROCHLOROTHIAZIDE 12.5 MG: 12.5 | 90 days supply | Qty: 90 | Fill #2 | Status: TO

## 2016-07-05 MED FILL — ATENOLOL 25 MG TABLET: 25 | 30 days supply | Qty: 30 | Fill #1 | Status: TO

## 2016-08-15 MED FILL — predniSONE 20 MG TABS: 20 | 70 days supply | Qty: 40 | Fill #1

## 2016-09-12 ENCOUNTER — Other Ambulatory Visit: Payer: Self-pay | Admitting: Emergency Medicine

## 2016-09-12 MED ORDER — PREDNISONE 20 MG PO TABS: ORAL_TABLET | ORAL | 1 refills | Status: DC

## 2016-11-10 ENCOUNTER — Other Ambulatory Visit: Payer: Self-pay | Admitting: Family Medicine

## 2016-11-10 DIAGNOSIS — Z1231 Encounter for screening mammogram for malignant neoplasm of breast: Secondary | ICD-10-CM

## 2016-12-01 ENCOUNTER — Ambulatory Visit
Admission: RE | Admit: 2016-12-01 | Discharge: 2016-12-01 | Disposition: A | Discharge status: Home / Self Care | Payer: Medicare Other | Source: Ambulatory Visit | Attending: Family Medicine | Admitting: Family Medicine

## 2016-12-01 DIAGNOSIS — Z1231 Encounter for screening mammogram for malignant neoplasm of breast: Secondary | ICD-10-CM

## 2016-12-02 ENCOUNTER — Other Ambulatory Visit: Payer: Self-pay | Admitting: Family Medicine

## 2017-04-17 ENCOUNTER — Encounter: Payer: Self-pay | Admitting: Family Medicine

## 2017-04-17 ENCOUNTER — Ambulatory Visit (INDEPENDENT_AMBULATORY_CARE_PROVIDER_SITE_OTHER): Payer: Medicare Other | Admitting: Family Medicine

## 2017-04-17 VITALS — BP 118/70 | HR 68 | Temp 98.0°F | Ht 62.0 in | Wt 183.0 lb

## 2017-04-17 DIAGNOSIS — G8929 Other chronic pain: Secondary | ICD-10-CM | POA: Diagnosis not present

## 2017-04-17 DIAGNOSIS — M25561 Pain in right knee: Secondary | ICD-10-CM

## 2017-04-17 DIAGNOSIS — H9113 Presbycusis, bilateral: Secondary | ICD-10-CM | POA: Diagnosis not present

## 2017-04-17 DIAGNOSIS — I10 Essential (primary) hypertension: Secondary | ICD-10-CM

## 2017-04-17 DIAGNOSIS — Z23 Encounter for immunization: Secondary | ICD-10-CM | POA: Insufficient documentation

## 2017-04-17 DIAGNOSIS — M25562 Pain in left knee: Secondary | ICD-10-CM

## 2017-04-17 LAB — BASIC METABOLIC PANEL
BUN: 18 mg/dL (ref 6–23)
CO2: 30 mEq/L (ref 19–32)
Calcium: 9.5 mg/dL (ref 8.4–10.5)
Chloride: 103 mEq/L (ref 96–112)
Creatinine, Ser: 0.83 mg/dL (ref 0.40–1.20)
GFR: 69.25 mL/min (ref >60.00)
Glucose, Bld: 94 mg/dL (ref 70–99)
Potassium: 3.9 mEq/L (ref 3.5–5.1)
Sodium: 139 mEq/L (ref 135–145)

## 2017-04-17 LAB — POCT URINALYSIS DIPSTICK
Bilirubin, UA: NEGATIVE
Blood, UA: NEGATIVE
Glucose, UA: NEGATIVE
Ketones, UA: NEGATIVE
Nitrite, UA: NEGATIVE
Protein, UA: NEGATIVE
Spec Grav, UA: 1.02 (ref 1.010–1.025)
Urobilinogen, UA: 0.2 E.U./dL
pH, UA: 6 (ref 5.0–8.0)

## 2017-04-17 LAB — CBC WITH DIFFERENTIAL/PLATELET
Basophils Absolute: 0.1 10*3/uL (ref 0.0–0.1)
Basophils Relative: 1.8 % (ref 0.0–3.0)
Eosinophils Absolute: 0.4 10*3/uL (ref 0.0–0.7)
Eosinophils Relative: 6.3 % — ABNORMAL HIGH (ref 0.0–5.0)
HCT: 41.2 % (ref 36.0–46.0)
Hemoglobin: 13.8 g/dL (ref 12.0–15.0)
Lymphocytes Relative: 33.2 % (ref 12.0–46.0)
Lymphs Abs: 1.9 10*3/uL (ref 0.7–4.0)
MCHC: 33.5 g/dL (ref 30.0–36.0)
MCV: 93.8 fl (ref 78.0–100.0)
Monocytes Absolute: 0.6 10*3/uL (ref 0.1–1.0)
Monocytes Relative: 11.3 % (ref 3.0–12.0)
Neutro Abs: 2.6 10*3/uL (ref 1.4–7.7)
Neutrophils Relative %: 47.4 % (ref 43.0–77.0)
Platelets: 208 10*3/uL (ref 150.0–400.0)
RBC: 4.39 Mil/uL (ref 3.87–5.11)
RDW: 13.4 % (ref 11.5–15.5)
WBC: 5.6 10*3/uL (ref 4.0–10.5)

## 2017-04-17 LAB — TSH: TSH: 3.02 u[IU]/mL (ref 0.35–4.50)

## 2017-04-17 MED ORDER — HYDROCHLOROTHIAZIDE 12.5 MG PO CAPS: ORAL_CAPSULE | ORAL | 4 refills | Status: DC

## 2017-04-17 MED ORDER — ATENOLOL 25 MG PO TABS: ORAL_TABLET | ORAL | 3 refills | Status: DC

## 2017-04-17 NOTE — Progress Notes (Signed)
Taylor Blankenship is an 83 year old married female nonsmoker who comes in today for evaluation of hypertension bilateral knee pain dermatitis etiology unknown and significant hearing loss  Her hypertension is been managed with Tenormin 25 mg daily along with hydrochlorothiazide 12.5 mg daily. BP today is 118/70. She states she is not lightheaded when she stands up. Pulse 68 and regular. Her weights have been steady at 183.  She has a skin rash involving her face and neck. She's tried different over-the-counter preparations nothing's help. She's also tried some steroid cream from her friend and that didn't help either.  She's had a long-standing history of bilateral knee pain. It's so severe now that she can hardly walk. She does give the Y daily and use a recumbent exercise bike. We've advised her to seek consult from orthopedist but she declines.  Vaccinations up-to-date except she do a shingles vaccine which she declines.  Cognitive function normal she exercises daily home health safety reviewed no issues identified, no guns in the house, she does have a healthcare power of attorney and living well  14 point review of systems reviewed and otherwise negative  She gets routine eye care, dental care, recent mammography normal.  She has bilateral hearing aids that are 67-70 years old and don't really work. Recommend she go to [facility] and see Dr. Tonia Ghent.  EKG was done because a history of hypertension. EKG was unchanged and normal for her.  BP 118/70 (BP Location: Right Arm, Patient Position: Sitting, Cuff Size: Normal)   Pulse 68   Temp 98 F (36.7 C) (Oral)   Ht 5\' 2"  (1.575 m)   Wt 183 lb (83 kg)   BMI 33.47 kg/m  Examination HEENT was negative except his bilateral hearing aids and cerumen impaction left ear. Cerumen impactions removed by irrigation. Neck was supple thyroid is not enlarged no carotid bruits cardiopulmonary exam normal abdominal exam normal pelvic and rectal not  indicated extremities normal skin normal peripheral pulses normal except for numerous seborrheic keratosis and an erythematous rash involving her scalp for head and neck. Parenthetically she went to see a dermatologist however did not see the doctor. She saw PA who she did care for.  #1 hypertension at goal.......... continue current therapy  #2 profound hearing loss......Marland Kitchen referred to Dr. Tonia Ghent audiologists to [facility]  #3 bilateral knee pain referred to Dr. Durward Fortes for consult  #4 dermatitis etiology unknown.......... consult with Dr. Nena Polio

## 2017-04-17 NOTE — Patient Instructions (Signed)
Continue current medication  Continue daily exercise  I would recommend a consult Dr. Joni Fears to evaluate your knee pain  I recommend Dr. Nena Polio,,,,,,,, dermatologist  Strongly consider the shingles vaccine

## 2017-04-18 ENCOUNTER — Ambulatory Visit (INDEPENDENT_AMBULATORY_CARE_PROVIDER_SITE_OTHER): Payer: Medicare Other | Admitting: *Deleted

## 2017-04-18 DIAGNOSIS — Z23 Encounter for immunization: Secondary | ICD-10-CM | POA: Diagnosis not present

## 2017-04-21 ENCOUNTER — Encounter: Payer: Self-pay | Admitting: Family Medicine

## 2017-05-19 ENCOUNTER — Encounter: Payer: Self-pay | Admitting: Family Medicine

## 2017-05-30 ENCOUNTER — Telehealth: Payer: Self-pay

## 2017-05-30 NOTE — Telephone Encounter (Signed)
Called pt spoke with the daughter who voiced understanding that dr Sherren Mocha is  Recommending for the mother to go to [facility] dermatology

## 2017-08-07 ENCOUNTER — Encounter: Payer: Self-pay | Admitting: Family Medicine

## 2017-08-07 ENCOUNTER — Ambulatory Visit (INDEPENDENT_AMBULATORY_CARE_PROVIDER_SITE_OTHER): Payer: Medicare Other | Admitting: Family Medicine

## 2017-08-07 VITALS — BP 118/70 | HR 66 | Temp 98.4°F | Wt 183.0 lb

## 2017-08-07 DIAGNOSIS — J4520 Mild intermittent asthma, uncomplicated: Secondary | ICD-10-CM | POA: Diagnosis not present

## 2017-08-07 DIAGNOSIS — I499 Cardiac arrhythmia, unspecified: Secondary | ICD-10-CM | POA: Diagnosis not present

## 2017-08-07 MED ORDER — BECLOMETHASONE DIPROP HFA 40 MCG/ACT IN AERB: 2.0000 | INHALATION_SPRAY | Freq: Two times a day (BID) | RESPIRATORY_TRACT | 4 refills | Status: DC

## 2017-08-07 NOTE — Progress Notes (Signed)
Taylor Blankenship is an 84 year old married female nonsmoker comes in today for evaluation 2 problems  She developed a cough 6 weeks ago. No antecedent symptoms fever etc. She's had this in the past. She has a history of intermittent low-grade asthma they usually flares in the winter. She's supposed be taking her Qvar on a daily basis but only takes it intermittently. She started back taking the Qvar 2 puffs daily one week ago.  No fever sputum production etc.  Second issue is a regular heart rate. She's notices now for about 1-2 weeks. Says it lasts for 2-3 minutes may occur multiple times throughout the day. She doesn't feel like it's rapid she feels like it skips. She's had no chest pain etc.  Dietary history......... caffeine free  BP 118/70 (BP Location: Left Arm, Patient Position: Sitting, Cuff Size: Normal)   Pulse 66   Temp 98.4 F (36.9 C) (Oral)   Wt 183 lb (83 kg)   BMI 33.47 kg/m  She's a well-developed well-nourished female no acute distress examination lungs shows mild symmetrical late expiratory wheezing bilaterally.  Cardiac exam PMI is nonpalpable. The rate is irregularly irregular. EKG pending.  #1 mild intermittent asthma........ increase Qvar to 2 puffs twice a day........ discussed oral prednisone but patient declines  #2 cardiac arrhythmia............... EKG shows PVCs ............... continue beta blocker but increased dose to 25 mg daily

## 2017-08-07 NOTE — Patient Instructions (Signed)
Tenormin 25 mg......... one tablet daily until arrhythmia stops........ then go back to a half a tab a day  Qvar............ 2 puffs twice a day until cough stops  Return when necessary

## 2017-10-30 ENCOUNTER — Other Ambulatory Visit: Payer: Self-pay | Admitting: Family Medicine

## 2017-10-30 DIAGNOSIS — Z1231 Encounter for screening mammogram for malignant neoplasm of breast: Secondary | ICD-10-CM

## 2017-11-23 ENCOUNTER — Inpatient Hospital Stay (HOSPITAL_COMMUNITY)
Admission: EM | Admit: 2017-11-23 | Discharge: 2017-11-25 | DRG: 066 | Disposition: A | Discharge status: Home / Self Care | Payer: Medicare Other | Attending: Neurosurgery | Admitting: Neurosurgery

## 2017-11-23 DIAGNOSIS — Z85820 Personal history of malignant melanoma of skin: Secondary | ICD-10-CM

## 2017-11-23 DIAGNOSIS — M810 Age-related osteoporosis without current pathological fracture: Secondary | ICD-10-CM | POA: Diagnosis present

## 2017-11-23 DIAGNOSIS — I671 Cerebral aneurysm, nonruptured: Secondary | ICD-10-CM | POA: Diagnosis not present

## 2017-11-23 DIAGNOSIS — I10 Essential (primary) hypertension: Secondary | ICD-10-CM | POA: Diagnosis present

## 2017-11-23 DIAGNOSIS — Z881 Allergy status to other antibiotic agents status: Secondary | ICD-10-CM

## 2017-11-23 DIAGNOSIS — R011 Cardiac murmur, unspecified: Secondary | ICD-10-CM | POA: Diagnosis present

## 2017-11-23 DIAGNOSIS — I493 Ventricular premature depolarization: Secondary | ICD-10-CM | POA: Diagnosis present

## 2017-11-23 DIAGNOSIS — H9190 Unspecified hearing loss, unspecified ear: Secondary | ICD-10-CM | POA: Diagnosis present

## 2017-11-23 DIAGNOSIS — I615 Nontraumatic intracerebral hemorrhage, intraventricular: Principal | ICD-10-CM | POA: Diagnosis present

## 2017-11-23 DIAGNOSIS — K219 Gastro-esophageal reflux disease without esophagitis: Secondary | ICD-10-CM | POA: Diagnosis present

## 2017-11-23 DIAGNOSIS — J4520 Mild intermittent asthma, uncomplicated: Secondary | ICD-10-CM | POA: Diagnosis present

## 2017-11-23 DIAGNOSIS — Z87891 Personal history of nicotine dependence: Secondary | ICD-10-CM

## 2017-11-23 DIAGNOSIS — Z8619 Personal history of other infectious and parasitic diseases: Secondary | ICD-10-CM

## 2017-11-23 DIAGNOSIS — Z79899 Other long term (current) drug therapy: Secondary | ICD-10-CM

## 2017-11-23 DIAGNOSIS — Z880 Allergy status to penicillin: Secondary | ICD-10-CM

## 2017-11-23 DIAGNOSIS — Z7951 Long term (current) use of inhaled steroids: Secondary | ICD-10-CM

## 2017-11-23 NOTE — ED Triage Notes (Signed)
Pt brought in from home with c/o headache and high blood pressure   Pt states her headache is getting better but her upper right shoulder area hurts  Movement makes the pain worse  Pt states her headache came first and it was like something was squeezing her head

## 2017-11-24 ENCOUNTER — Inpatient Hospital Stay (HOSPITAL_COMMUNITY): Payer: Medicare Other

## 2017-11-24 ENCOUNTER — Encounter (HOSPITAL_COMMUNITY): Payer: Self-pay

## 2017-11-24 ENCOUNTER — Other Ambulatory Visit: Payer: Self-pay

## 2017-11-24 ENCOUNTER — Emergency Department (HOSPITAL_COMMUNITY): Payer: Medicare Other

## 2017-11-24 DIAGNOSIS — H9190 Unspecified hearing loss, unspecified ear: Secondary | ICD-10-CM | POA: Diagnosis present

## 2017-11-24 DIAGNOSIS — J4520 Mild intermittent asthma, uncomplicated: Secondary | ICD-10-CM | POA: Diagnosis present

## 2017-11-24 DIAGNOSIS — I493 Ventricular premature depolarization: Secondary | ICD-10-CM | POA: Diagnosis present

## 2017-11-24 DIAGNOSIS — I615 Nontraumatic intracerebral hemorrhage, intraventricular: Secondary | ICD-10-CM

## 2017-11-24 DIAGNOSIS — I671 Cerebral aneurysm, nonruptured: Secondary | ICD-10-CM | POA: Diagnosis present

## 2017-11-24 DIAGNOSIS — Z87891 Personal history of nicotine dependence: Secondary | ICD-10-CM | POA: Diagnosis not present

## 2017-11-24 DIAGNOSIS — Z7951 Long term (current) use of inhaled steroids: Secondary | ICD-10-CM | POA: Diagnosis not present

## 2017-11-24 DIAGNOSIS — K219 Gastro-esophageal reflux disease without esophagitis: Secondary | ICD-10-CM | POA: Diagnosis present

## 2017-11-24 DIAGNOSIS — M810 Age-related osteoporosis without current pathological fracture: Secondary | ICD-10-CM | POA: Diagnosis present

## 2017-11-24 DIAGNOSIS — Z880 Allergy status to penicillin: Secondary | ICD-10-CM | POA: Diagnosis not present

## 2017-11-24 DIAGNOSIS — R011 Cardiac murmur, unspecified: Secondary | ICD-10-CM | POA: Diagnosis present

## 2017-11-24 DIAGNOSIS — I10 Essential (primary) hypertension: Secondary | ICD-10-CM | POA: Diagnosis present

## 2017-11-24 DIAGNOSIS — Z79899 Other long term (current) drug therapy: Secondary | ICD-10-CM | POA: Diagnosis not present

## 2017-11-24 DIAGNOSIS — Z881 Allergy status to other antibiotic agents status: Secondary | ICD-10-CM | POA: Diagnosis not present

## 2017-11-24 DIAGNOSIS — Z8619 Personal history of other infectious and parasitic diseases: Secondary | ICD-10-CM | POA: Diagnosis not present

## 2017-11-24 DIAGNOSIS — Z85820 Personal history of malignant melanoma of skin: Secondary | ICD-10-CM | POA: Diagnosis not present

## 2017-11-24 HISTORY — PX: IR ANGIO INTRA EXTRACRAN SEL COM CAROTID INNOMINATE BILAT MOD SED: IMG5360

## 2017-11-24 HISTORY — PX: IR ANGIO VERTEBRAL SEL VERTEBRAL BILAT MOD SED: IMG5369

## 2017-11-24 LAB — CBC WITH DIFFERENTIAL/PLATELET
Basophils Absolute: 0.1 10*3/uL (ref 0.0–0.1)
Basophils Relative: 1 %
Eosinophils Absolute: 0.4 10*3/uL (ref 0.0–0.7)
Eosinophils Relative: 5 %
HCT: 42.6 % (ref 36.0–46.0)
Hemoglobin: 14.2 g/dL (ref 12.0–15.0)
Lymphocytes Relative: 30 %
Lymphs Abs: 2.6 10*3/uL (ref 0.7–4.0)
MCH: 30.8 pg (ref 26.0–34.0)
MCHC: 33.3 g/dL (ref 30.0–36.0)
MCV: 92.4 fL (ref 78.0–100.0)
Monocytes Absolute: 0.8 10*3/uL (ref 0.1–1.0)
Monocytes Relative: 10 %
Neutro Abs: 4.6 10*3/uL (ref 1.7–7.7)
Neutrophils Relative %: 54 %
Platelets: 217 10*3/uL (ref 150–400)
RBC: 4.61 MIL/uL (ref 3.87–5.11)
RDW: 13 % (ref 11.5–15.5)
WBC: 8.5 10*3/uL (ref 4.0–10.5)

## 2017-11-24 LAB — BASIC METABOLIC PANEL
Anion gap: 12 (ref 5–15)
BUN: 16 mg/dL (ref 6–20)
CO2: 25 mmol/L (ref 22–32)
Calcium: 9.5 mg/dL (ref 8.9–10.3)
Chloride: 105 mmol/L (ref 101–111)
Creatinine, Ser: 0.88 mg/dL (ref 0.44–1.00)
GFR calc Af Amer: >60 mL/min (ref >60)
GFR calc non Af Amer: 58 mL/min — ABNORMAL LOW (ref >60)
Glucose, Bld: 114 mg/dL — ABNORMAL HIGH (ref 65–99)
Potassium: 3.9 mmol/L (ref 3.5–5.1)
Sodium: 142 mmol/L (ref 135–145)

## 2017-11-24 LAB — PROTIME-INR
INR: 1.03
Prothrombin Time: 13.4 seconds (ref 11.4–15.2)

## 2017-11-24 LAB — MRSA PCR SCREENING: MRSA by PCR: NEGATIVE

## 2017-11-24 MED ORDER — SODIUM CHLORIDE 0.9 % IV BOLUS
500.0000 mL | Freq: Once | INTRAVENOUS | Status: AC
  Administered 2017-11-24: 500 mL via INTRAVENOUS

## 2017-11-24 MED ORDER — MIDAZOLAM HCL 2 MG/2ML IJ SOLN
INTRAMUSCULAR | Status: AC | PRN
  Administered 2017-11-24: 1 mg via INTRAVENOUS

## 2017-11-24 MED ORDER — IOPAMIDOL (ISOVUE-300) INJECTION 61%
INTRAVENOUS | Status: AC
  Administered 2017-11-24: 50 mL
  Filled 2017-11-24: qty 150

## 2017-11-24 MED ORDER — ATENOLOL 12.5 MG HALF TABLET
12.5000 mg | ORAL_TABLET | Freq: Every day | ORAL | Status: DC
  Filled 2017-11-24: qty 1

## 2017-11-24 MED ORDER — IOPAMIDOL (ISOVUE-370) INJECTION 76%
INTRAVENOUS | Status: AC
  Filled 2017-11-24: qty 100

## 2017-11-24 MED ORDER — FENTANYL CITRATE (PF) 100 MCG/2ML IJ SOLN
INTRAMUSCULAR | Status: AC
  Filled 2017-11-24: qty 2

## 2017-11-24 MED ORDER — DEXAMETHASONE SODIUM PHOSPHATE 10 MG/ML IJ SOLN
6.0000 mg | INTRAMUSCULAR | Status: AC
  Administered 2017-11-24: 6 mg via INTRAVENOUS
  Filled 2017-11-24: qty 1

## 2017-11-24 MED ORDER — FENTANYL CITRATE (PF) 100 MCG/2ML IJ SOLN
INTRAMUSCULAR | Status: AC | PRN
  Administered 2017-11-24: 25 ug via INTRAVENOUS

## 2017-11-24 MED ORDER — HYDROCODONE-ACETAMINOPHEN 5-325 MG PO TABS: 1.0000 | ORAL_TABLET | ORAL | Status: DC | PRN

## 2017-11-24 MED ORDER — LABETALOL HCL 5 MG/ML IV SOLN: 5.0000 mg | INTRAVENOUS | Status: DC | PRN

## 2017-11-24 MED ORDER — DEXAMETHASONE SODIUM PHOSPHATE 4 MG/ML IJ SOLN
4.0000 mg | Freq: Four times a day (QID) | INTRAMUSCULAR | Status: AC
  Administered 2017-11-24 – 2017-11-25 (×4): 4 mg via INTRAVENOUS
  Filled 2017-11-24 (×4): qty 1

## 2017-11-24 MED ORDER — DIPHENHYDRAMINE HCL 50 MG/ML IJ SOLN
25.0000 mg | Freq: Once | INTRAMUSCULAR | Status: AC
  Administered 2017-11-24: 25 mg via INTRAVENOUS
  Filled 2017-11-24: qty 1

## 2017-11-24 MED ORDER — IOPAMIDOL (ISOVUE-370) INJECTION 76%
100.0000 mL | Freq: Once | INTRAVENOUS | Status: AC | PRN
  Administered 2017-11-24: 100 mL via INTRAVENOUS

## 2017-11-24 MED ORDER — SENNOSIDES-DOCUSATE SODIUM 8.6-50 MG PO TABS
1.0000 | ORAL_TABLET | Freq: Two times a day (BID) | ORAL | Status: DC
  Filled 2017-11-24: qty 1

## 2017-11-24 MED ORDER — NIMODIPINE 60 MG/20ML PO SOLN
60.0000 mg | ORAL | Status: DC
  Filled 2017-11-24: qty 20

## 2017-11-24 MED ORDER — SODIUM CHLORIDE 0.9 % IV SOLN: INTRAVENOUS | Status: AC

## 2017-11-24 MED ORDER — FAMOTIDINE IN NACL 20-0.9 MG/50ML-% IV SOLN
20.0000 mg | Freq: Two times a day (BID) | INTRAVENOUS | Status: DC
  Administered 2017-11-24: 20 mg via INTRAVENOUS
  Filled 2017-11-24: qty 50

## 2017-11-24 MED ORDER — SODIUM CHLORIDE 0.9 % IV BOLUS
1000.0000 mL | Freq: Once | INTRAVENOUS | Status: AC
  Administered 2017-11-24: 1000 mL via INTRAVENOUS

## 2017-11-24 MED ORDER — ACETAMINOPHEN 160 MG/5ML PO SOLN: 650.0000 mg | ORAL | Status: DC | PRN

## 2017-11-24 MED ORDER — MIDAZOLAM HCL 2 MG/2ML IJ SOLN
INTRAMUSCULAR | Status: AC
  Filled 2017-11-24: qty 2

## 2017-11-24 MED ORDER — LIDOCAINE HCL (PF) 1 % IJ SOLN
INTRAMUSCULAR | Status: AC | PRN
  Administered 2017-11-24: 10 mL

## 2017-11-24 MED ORDER — NIMODIPINE 30 MG PO CAPS
60.0000 mg | ORAL_CAPSULE | ORAL | Status: DC
  Administered 2017-11-24 (×2): 60 mg via ORAL
  Filled 2017-11-24 (×3): qty 2

## 2017-11-24 MED ORDER — HEPARIN SODIUM (PORCINE) 1000 UNIT/ML IJ SOLN
INTRAMUSCULAR | Status: AC
  Filled 2017-11-24: qty 1

## 2017-11-24 MED ORDER — METOCLOPRAMIDE HCL 5 MG/ML IJ SOLN
10.0000 mg | Freq: Once | INTRAMUSCULAR | Status: AC
  Administered 2017-11-24: 10 mg via INTRAVENOUS
  Filled 2017-11-24: qty 2

## 2017-11-24 MED ORDER — LIDOCAINE HCL 1 % IJ SOLN
INTRAMUSCULAR | Status: AC
  Filled 2017-11-24: qty 20

## 2017-11-24 MED ORDER — IOPAMIDOL (ISOVUE-300) INJECTION 61%
INTRAVENOUS | Status: AC
  Administered 2017-11-24: 40 mL
  Filled 2017-11-24: qty 50

## 2017-11-24 MED ORDER — HYDRALAZINE HCL 20 MG/ML IJ SOLN: 5.0000 mg | INTRAMUSCULAR | Status: DC | PRN

## 2017-11-24 MED ORDER — ACETAMINOPHEN 650 MG RE SUPP: 650.0000 mg | RECTAL | Status: DC | PRN

## 2017-11-24 MED ORDER — ACETAMINOPHEN 325 MG PO TABS: 650.0000 mg | ORAL_TABLET | ORAL | Status: DC | PRN

## 2017-11-24 MED ORDER — POTASSIUM CHLORIDE IN NACL 20-0.9 MEQ/L-% IV SOLN
INTRAVENOUS | Status: DC
  Administered 2017-11-24: 07:00:00 via INTRAVENOUS
  Filled 2017-11-24 (×2): qty 1000

## 2017-11-24 NOTE — Procedures (Signed)
S/P 4 vessel cerebral arteriogram RT CFA approach. Findings. 1.No angio evidence of aneurysm,AVM,DAVF  dissections or AV shunting. 2.Venous outflow grossly intact.

## 2017-11-24 NOTE — Sedation Documentation (Signed)
Patient is resting comfortably. 

## 2017-11-24 NOTE — ED Notes (Signed)
Pt states that tonight she was doing dishes and all of a sudden out of no where she had "the worst headache" come on all over her head and "never had anything like it before"

## 2017-11-24 NOTE — ED Notes (Signed)
Neurologist at bedside. 

## 2017-11-24 NOTE — ED Notes (Signed)
ED TO INPATIENT HANDOFF REPORT  Name/Age/Gender Taylor Blankenship 84 y.o. 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 Actinic keratosis   . ASTHMA   . Dermatophytosis of nail   . DJD (degenerative joint disease)   . History of shingles   . HYPERTENSION   . OSTEOPOROSIS   . PALPITATIONS, RECURRENT     Allergies Allergies  Allergen Reactions  . Amoxicillin Hives, Itching and Rash    Has patient had a PCN reaction causing immediate rash, facial/tongue/throat swelling, SOB or lightheadedness with hypotension: Yes Has patient had a PCN reaction causing severe rash involving mucus membranes or skin necrosis: No Has patient had a PCN reaction that required hospitalization: No Has patient had a PCN reaction occurring within the last 10 years: No If all of the above answers are "NO", then may proceed with Cephalosporin use.   . Cephalexin Hives, Itching and Rash  . Doxycycline Rash    IV Location/Drains/Wounds Patient Lines/Drains/Airways Status   Active Line/Drains/Airways    Name:   Placement date:   Placement time:   Site:   Days:   Peripheral IV 11/24/17 Left Antecubital   11/24/17    0122    Antecubital   less than 1          Labs/Imaging Results for orders placed or  performed during the hospital encounter of 11/23/17 (from the past 48 hour(s))  Basic metabolic panel     Status: Abnormal   Collection Time: 11/24/17  1:26 AM  Result Value Ref Range   Sodium 142 135 - 145 mmol/L   Potassium 3.9 3.5 - 5.1 mmol/L   Chloride 105 101 - 111 mmol/L   CO2 25 22 - 32 mmol/L   Glucose, Bld 114 (H) 65 - 99 mg/dL   BUN 16 6 - 20 mg/dL   Creatinine, Ser 0.88 0.44 - 1.00 mg/dL   Calcium 9.5 8.9 - 10.3 mg/dL   GFR calc non Af Amer 58 (L) >60 mL/min   GFR calc Af Amer >60 >60 mL/min    Comment: (NOTE) The eGFR has been calculated using the CKD EPI equation. This calculation has not been validated in all clinical situations. eGFR's persistently <60 mL/min signify possible Chronic Kidney Disease.    Anion gap 12 5 - 15    Comment: Performed at [facility], [place] [address] [place], [place] [address]  CBC with Differential     Status: None   Collection Time: 11/24/17  1:26 AM  Result Value Ref Range   WBC 8.5 4.0 - 10.5 K/uL   RBC 4.61 3.87 - 5.11 MIL/uL   Hemoglobin 14.2 12.0 - 15.0 g/dL   HCT 42.6 36.0 - 46.0 %   MCV 92.4 78.0 - 100.0 fL   MCH 30.8 26.0 - 34.0  pg   MCHC 33.3 30.0 - 36.0 g/dL   RDW 13.0 11.5 - 15.5 %   Platelets 217 150 - 400 K/uL   Neutrophils Relative % 54 %   Neutro Abs 4.6 1.7 - 7.7 K/uL   Lymphocytes Relative 30 %   Lymphs Abs 2.6 0.7 - 4.0 K/uL   Monocytes Relative 10 %   Monocytes Absolute 0.8 0.1 - 1.0 K/uL   Eosinophils Relative 5 %   Eosinophils Absolute 0.4 0.0 - 0.7 K/uL   Basophils Relative 1 %   Basophils Absolute 0.1 0.0 - 0.1 K/uL    Comment: Performed at [facility], [place] [address] [place],  [address]   Ct Angio Head W Or Wo Contrast  Result Date: 11/24/2017 CLINICAL DATA:  Acute onset severe headache while doing dishes. Posterior neck pain. History of hypertension. EXAM: CT ANGIOGRAPHY HEAD AND NECK TECHNIQUE: Multidetector CT imaging of the head and neck was performed  using the standard protocol during bolus administration of intravenous contrast. Multiplanar CT image reconstructions and MIPs were obtained to evaluate the vascular anatomy. Carotid stenosis measurements (when applicable) are obtained utilizing NASCET criteria, using the distal internal carotid diameter as the denominator. CONTRAST:  121m ISOVUE-370 IOPAMIDOL (ISOVUE-370) INJECTION 76% COMPARISON:  None. FINDINGS: CT HEAD FINDINGS BRAIN: No intraparenchymal hemorrhage, mass effect nor midline shift. Moderate ventriculomegaly, trace dependent density in the occipital horns. Patchy supratentorial white matter hypodensities within normal range for patient's age, though non-specific are most compatible with chronic small vessel ischemic disease. LEFT inferior basal ganglia perivascular space. No acute large vascular territory infarcts. No abnormal extra-axial fluid collections. Basal cisterns are patent. VASCULAR: Minimal calcific atherosclerosis of the carotid siphons. SKULL: No skull fracture. No significant scalp soft tissue swelling. SINUSES/ORBITS: The mastoid air-cells and included paranasal sinuses are well-aerated.The included ocular globes and orbital contents are non-suspicious. Status post bilateral ocular lens implants. OTHER: None. CTA NECK FINDINGS: AORTIC ARCH: Normal appearance of the thoracic arch, normal branch pattern. Mild calcific atherosclerosis aortic arch. The origins of the innominate, left Common carotid artery and subclavian artery are widely patent. RIGHT CAROTID SYSTEM: Common carotid artery is patent. Normal appearance of the carotid bifurcation without hemodynamically significant stenosis by NASCET criteria. Normal appearance of the internal carotid artery. LEFT CAROTID SYSTEM: Common carotid artery is patent. Trace calcific atherosclerosis carotid bifurcation without hemodynamically significant stenosis by NASCET criteria. Normal appearance of the internal carotid artery. VERTEBRAL  ARTERIES:Left vertebral artery is dominant. Normal appearance of the vertebral arteries, widely patent. SKELETON: No acute osseous process though bone windows have not been submitted. Moderate C5-6 degenerative disc and moderate LEFT C4-5 facet arthropathy. Moderate to severe LEFT C4-5 and C5-6 neural foraminal narrowing. OTHER NECK: Soft tissues of the neck are nonacute though, not tailored for evaluation. UPPER CHEST: RIGHT upper lobe ground-glass opacity measuring approximately 16 mm. CTA HEAD FINDINGS: ANTERIOR CIRCULATION: Patent cervical internal carotid arteries, petrous, cavernous and supra clinoid internal carotid arteries. 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 Patent anterior communicating artery. Patent anterior and middle cerebral arteries. No large vessel occlusion, significant stenosis, contrast extravasation. POSTERIOR CIRCULATION: Patent vertebral arteries, vertebrobasilar junction and basilar artery, as well as main branch vessels. Patent posterior cerebral arteries. No large vessel occlusion, significant stenosis, contrast extravasation or aneurysm. VENOUS SINUSES: Major dural venous sinuses are patent though not tailored for evaluation on this angiographic examination. ANATOMIC VARIANTS: Duplicated RIGHT PCA. DELAYED PHASE: No abnormal intracranial enhancement. MIP images reviewed. IMPRESSION: CT HEAD: 1.  Trace dependent intraventricular blood products (redistributed subarachnoid hemorrhage) or purulent material. 2. Otherwise negative CT HEAD with and without contrast for age. CTA NECK: 1. No hemodynamically significant stenosis or acute vascular process in the neck. 2. RIGHT upper lobe ground-glass with and may be infectious or inflammatory. Recommend follow-up. 3. Moderate to severe LEFT C4-5 and C5-6 neural foraminal narrowing. CTA HEAD: 1. No emergent large vessel occlusion or flow limiting stenosis. 2. 2 mm LEFT PCOM aneurysm with pointed appearance, potential  source of blood products. Neuro-Interventional Radiology consultation is suggested to evaluate the appropriateness of potential treatment. Non-emergent evaluation can be arranged by calling [phone] during usual hours. Emergency evaluation can be requested by paging [phone]. Critical Value/emergent results were called by telephone at the time of interpretation on 11/24/2017 at 2:55 am to Dr. Delora Fuel , who verbally acknowledged these results. Aortic Atherosclerosis (ICD10-I70.0). Electronically Signed   By: Elon Alas M.D.   On: 11/24/2017 02:56   Ct Angio Neck W And/or Wo Contrast  Result Date: 11/24/2017 CLINICAL DATA:  Acute onset severe headache while doing dishes. Posterior neck pain. History of hypertension. EXAM: CT ANGIOGRAPHY HEAD AND NECK TECHNIQUE: Multidetector CT imaging of the head and neck was performed using the standard protocol during bolus administration of intravenous contrast. Multiplanar CT image reconstructions and MIPs were obtained to evaluate the vascular anatomy. Carotid stenosis measurements (when applicable) are obtained utilizing NASCET criteria, using the distal internal carotid diameter as the denominator. CONTRAST:  125m ISOVUE-370 IOPAMIDOL (ISOVUE-370) INJECTION 76% COMPARISON:  None. FINDINGS: CT HEAD FINDINGS BRAIN: No intraparenchymal hemorrhage, mass effect nor midline shift. Moderate ventriculomegaly, trace dependent density in the occipital horns. Patchy supratentorial white matter hypodensities within normal range for patient's age, though non-specific are most compatible with chronic small vessel ischemic disease. LEFT inferior basal ganglia perivascular space. No acute large vascular territory infarcts. No abnormal extra-axial fluid collections. Basal cisterns are patent. VASCULAR: Minimal calcific atherosclerosis of the carotid siphons. SKULL: No skull fracture. No significant scalp soft tissue swelling. SINUSES/ORBITS: The mastoid air-cells and  included paranasal sinuses are well-aerated.The included ocular globes and orbital contents are non-suspicious. Status post bilateral ocular lens implants. OTHER: None. CTA NECK FINDINGS: AORTIC ARCH: Normal appearance of the thoracic arch, normal branch pattern. Mild calcific atherosclerosis aortic arch. The origins of the innominate, left Common carotid artery and subclavian artery are widely patent. RIGHT CAROTID SYSTEM: Common carotid artery is patent. Normal appearance of the carotid bifurcation without hemodynamically significant stenosis by NASCET criteria. Normal appearance of the internal carotid artery. LEFT CAROTID SYSTEM: Common carotid artery is patent. Trace calcific atherosclerosis carotid bifurcation without hemodynamically significant stenosis by NASCET criteria. Normal appearance of the internal carotid artery. VERTEBRAL ARTERIES:Left vertebral artery is dominant. Normal appearance of the vertebral arteries, widely patent. SKELETON: No acute osseous process though bone windows have not been submitted. Moderate C5-6 degenerative disc and moderate LEFT C4-5 facet arthropathy. Moderate to severe LEFT C4-5 and C5-6 neural foraminal narrowing. OTHER NECK: Soft tissues of the neck are nonacute though, not tailored for evaluation. UPPER CHEST: RIGHT upper lobe ground-glass opacity measuring approximately 16 mm. CTA HEAD FINDINGS: ANTERIOR CIRCULATION: Patent cervical internal carotid arteries, petrous, cavernous and supra clinoid internal carotid arteries. 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 Patent anterior communicating artery. Patent anterior and middle cerebral arteries. No large vessel occlusion, significant stenosis, contrast extravasation. POSTERIOR CIRCULATION: Patent vertebral arteries, vertebrobasilar junction and basilar artery, as well as main branch vessels. Patent posterior cerebral arteries. No  large vessel occlusion, significant stenosis, contrast  extravasation or aneurysm. VENOUS SINUSES: Major dural venous sinuses are patent though not tailored for evaluation on this angiographic examination. ANATOMIC VARIANTS: Duplicated RIGHT PCA. DELAYED PHASE: No abnormal intracranial enhancement. MIP images reviewed. IMPRESSION: CT HEAD: 1. Trace dependent intraventricular blood products (redistributed subarachnoid hemorrhage) or purulent material. 2. Otherwise negative CT HEAD with and without contrast for age. CTA NECK: 1. No hemodynamically significant stenosis or acute vascular process in the neck. 2. RIGHT upper lobe ground-glass with and may be infectious or inflammatory. Recommend follow-up. 3. Moderate to severe LEFT C4-5 and C5-6 neural foraminal narrowing. CTA HEAD: 1. No emergent large vessel occlusion or flow limiting stenosis. 2. 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 Neuro-Interventional Radiology consultation is suggested to evaluate the appropriateness of potential treatment. Non-emergent evaluation can be arranged by calling [phone] during usual hours. Emergency evaluation can be requested by paging [phone]. Critical Value/emergent results were called by telephone at the time of interpretation on 11/24/2017 at 2:55 am to Dr. Delora Fuel , who verbally acknowledged these results. Aortic Atherosclerosis (ICD10-I70.0). Electronically Signed   By: Elon Alas M.D.   On: 11/24/2017 02:56    Pending Labs Unresulted Labs (From admission, onward)   None      Vitals/Pain Today's Vitals   11/24/17 0100 11/24/17 0200 11/24/17 0300 11/24/17 0404  BP: (!) 168/70 (!) 167/78 (!) 121/109 136/87  Pulse: 67 76 71 77  Resp: 16 15 12 16   Temp:      TempSrc:      SpO2: 97% 96% 98% 97%  Weight:      Height:      PainSc:        Isolation Precautions No active isolations  Medications Medications  sodium chloride 0.9 % bolus 1,000 mL (0 mLs Intravenous Stopped 11/24/17 0352)   metoCLOPramide (REGLAN) injection 10 mg (10 mg Intravenous Given 11/24/17 0120)  diphenhydrAMINE (BENADRYL) injection 25 mg (25 mg Intravenous Given 11/24/17 0120)  iopamidol (ISOVUE-370) 76 % injection 100 mL (100 mLs Intravenous Contrast Given 11/24/17 0225)    Mobility [place]

## 2017-11-24 NOTE — ED Provider Notes (Signed)
[facility] DEPT Provider Note   CSN: [id number] Arrival date & time: 11/23/17  2351     History   Chief Complaint Chief Complaint  Patient presents with  . Headache  . Hypertension    HPI Taylor Blankenship is a 84 y.o. female.  The history is provided by the patient.  She has history of asthma, hypertension, osteoporosis and comes in to the ED with onset about 11 PM of a sudden, severe global headache.  There is no associated visual change, nausea, vomiting.  She did not have any difficulty with speech.  There is no numbness or tingling or weakness.  Headache is subsiding, and she rates her current pain at 3/10.  She is complaining of pain in the right side of her neck.  She has never had a headache like this before.  She did not take any medication at home.  She did check her blood pressure at home, and states that it was elevated.  Past Medical History:  Diagnosis Date  . Actinic keratosis   . ASTHMA   . Dermatophytosis of nail   . DJD (degenerative joint disease)   . History of shingles   . HYPERTENSION   . OSTEOPOROSIS   . PALPITATIONS, RECURRENT     Patient Active Problem List   Diagnosis Date Noted  . Irregular heart beat 08/07/2017  . Mild intermittent asthma without complication 93/26/7124  . Need for prophylactic vaccination and inoculation against influenza 04/17/2017  . Heart murmur 12/08/2015  . Family history of colon cancer 01/03/2013  . Hearing loss of aging 01/03/2013  . Essential hypertension 05/28/2007  . Osteoporosis 05/28/2007    Past Surgical History:  Procedure Laterality Date  . BREAST EXCISIONAL BIOPSY Left 2004     OB History   None      Home Medications    Prior to Admission medications   Medication Sig Start Date End Date Taking? Authorizing Provider  atenolol (TENORMIN) 25 MG tablet Take  1 tablet by mouth daily. 04/17/17  Yes Dorena Cookey, MD  beclomethasone (QVAR REDIHALER) 40 MCG/ACT inhaler  Inhale 2 puffs into the lungs 2 (two) times daily. 08/07/17  Yes Dorena Cookey, MD  Calcium Carbonate (CALCIUM 600) 1500 MG TABS Take by mouth daily. As directed    Yes [provider]  cetirizine (ZYRTEC) 10 MG tablet Take 10 mg by mouth daily.   Yes [provider]  Cholecalciferol (VITAMIN D) 1000 UNITS capsule Take 1,000 Units by mouth daily.     Yes [provider]  hydrochlorothiazide (MICROZIDE) 12.5 MG capsule TAKE 1 CAPSULE BY MOUTH EVERY DAY 04/17/17  Yes Dorena Cookey, MD  Multiple Vitamin (MULTIVITAMIN) tablet Take 1 tablet by mouth daily.     Yes [provider]  Omega-3 Fatty Acids (FISH OIL) 300 MG CAPS Take by mouth daily.     Yes [provider]  triamcinolone (KENALOG) 0.025 % ointment Apply small amounts twice daily Patient not taking: Reported on 11/24/2017 10/29/15   Marin Olp, MD    Family History Family History  Problem Relation Age of Onset  . Arthritis Other   . Heart disease Other   . Cancer Other        colon  . Diabetes Other   . Breast cancer Maternal Aunt     Social History Social History   Tobacco Use  . Smoking status: Former Research scientist (life sciences)  . Smokeless tobacco: Former Network engineer Use Topics  .  Alcohol use: Yes  . Drug use: No     Allergies   Amoxicillin; Cephalexin; and Doxycycline   Review of Systems Review of Systems  All other systems reviewed and are negative.    Physical Exam Updated Vital Signs BP (!) 159/70 (BP Location: Right Arm)   Pulse 65   Temp 98.1 F (36.7 C) (Oral)   Resp 13   Ht 5\' 2"  (1.575 m)   Wt 83.9 kg (185 lb)   SpO2 98%   BMI 33.84 kg/m   Physical Exam  Nursing note and vitals reviewed.  84 year old female, resting comfortably and in no acute distress. Vital signs are significant for elevated systolic blood pressure. Oxygen saturation is 98%, which is normal. Head is normocephalic and atraumatic. PERRLA, EOMI. Oropharynx is clear. Neck is tender over  the right paracervical muscles, but supple without adenopathy or JVD.  There is no meningismus. Back is nontender and there is no CVA tenderness. Lungs are clear without rales, wheezes, or rhonchi. Chest is nontender. Heart has regular rate and rhythm without murmur. Abdomen is soft, flat, nontender without masses or hepatosplenomegaly and peristalsis is normoactive. Extremities have no cyanosis or edema, full range of motion is present. Skin is warm and dry without rash. Neurologic: Mental status is normal, cranial nerves are intact, there are no motor or sensory deficits.  Strength is 5/5 in both arms and both legs.  ED Treatments / Results  Labs (all labs ordered are listed, but only abnormal results are displayed) Labs Reviewed  BASIC METABOLIC PANEL - Abnormal; Notable for the following components:      Result Value   Glucose, Bld 114 (*)    GFR calc non Af Amer 58 (*)    All other components within normal limits  CBC WITH DIFFERENTIAL/PLATELET    EKG None  Radiology Ct Angio Head W Or Wo Contrast  Result Date: 11/24/2017 CLINICAL DATA:  Acute onset severe headache while doing dishes. Posterior neck pain. History of hypertension. EXAM: CT ANGIOGRAPHY HEAD AND NECK TECHNIQUE: Multidetector CT imaging of the head and neck was performed using the standard protocol during bolus administration of intravenous contrast. Multiplanar CT image reconstructions and MIPs were obtained to evaluate the vascular anatomy. Carotid stenosis measurements (when applicable) are obtained utilizing NASCET criteria, using the distal internal carotid diameter as the denominator. CONTRAST:  178mL ISOVUE-370 IOPAMIDOL (ISOVUE-370) INJECTION 76% COMPARISON:  None. FINDINGS: CT HEAD FINDINGS BRAIN: No intraparenchymal hemorrhage, mass effect nor midline shift. Moderate ventriculomegaly, trace dependent density in the occipital horns. Patchy supratentorial white matter hypodensities within normal range for  patient's age, though non-specific are most compatible with chronic small vessel ischemic disease. LEFT inferior basal ganglia perivascular space. No acute large vascular territory infarcts. No abnormal extra-axial fluid collections. Basal cisterns are patent. VASCULAR: Minimal calcific atherosclerosis of the carotid siphons. SKULL: No skull fracture. No significant scalp soft tissue swelling. SINUSES/ORBITS: The mastoid air-cells and included paranasal sinuses are well-aerated.The included ocular globes and orbital contents are non-suspicious. Status post bilateral ocular lens implants. OTHER: None. CTA NECK FINDINGS: AORTIC ARCH: Normal appearance of the thoracic arch, normal branch pattern. Mild calcific atherosclerosis aortic arch. The origins of the innominate, left Common carotid artery and subclavian artery are widely patent. RIGHT CAROTID SYSTEM: Common carotid artery is patent. Normal appearance of the carotid bifurcation without hemodynamically significant stenosis by NASCET criteria. Normal appearance of the internal carotid artery. LEFT CAROTID SYSTEM: Common carotid artery is patent. Trace calcific atherosclerosis carotid bifurcation without  hemodynamically significant stenosis by NASCET criteria. Normal appearance of the internal carotid artery. VERTEBRAL ARTERIES:Left vertebral artery is dominant. Normal appearance of the vertebral arteries, widely patent. SKELETON: No acute osseous process though bone windows have not been submitted. Moderate C5-6 degenerative disc and moderate LEFT C4-5 facet arthropathy. Moderate to severe LEFT C4-5 and C5-6 neural foraminal narrowing. OTHER NECK: Soft tissues of the neck are nonacute though, not tailored for evaluation. UPPER CHEST: RIGHT upper lobe ground-glass opacity measuring approximately 16 mm. CTA HEAD FINDINGS: ANTERIOR CIRCULATION: Patent cervical internal carotid arteries, petrous, cavernous and supra clinoid internal carotid arteries. 2 mm inferiorly  directed LEFT PCOM origin aneurysm with nipple sign. Patent anterior communicating artery. Patent anterior and middle cerebral arteries. No large vessel occlusion, significant stenosis, contrast extravasation. POSTERIOR CIRCULATION: Patent vertebral arteries, vertebrobasilar junction and basilar artery, as well as main branch vessels. Patent posterior cerebral arteries. No large vessel occlusion, significant stenosis, contrast extravasation or aneurysm. VENOUS SINUSES: Major dural venous sinuses are patent though not tailored for evaluation on this angiographic examination. ANATOMIC VARIANTS: Duplicated RIGHT PCA. DELAYED PHASE: No abnormal intracranial enhancement. MIP images reviewed. IMPRESSION: CT HEAD: 1. Trace dependent intraventricular blood products (redistributed subarachnoid hemorrhage) or purulent material. 2. Otherwise negative CT HEAD with and without contrast for age. CTA NECK: 1. No hemodynamically significant stenosis or acute vascular process in the neck. 2. RIGHT upper lobe ground-glass with and may be infectious or inflammatory. Recommend follow-up. 3. Moderate to severe LEFT C4-5 and C5-6 neural foraminal narrowing. CTA HEAD: 1. No emergent large vessel occlusion or flow limiting stenosis. 2. 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 Neuro-Interventional Radiology consultation is suggested to evaluate the appropriateness of potential treatment. Non-emergent evaluation can be arranged by calling [phone] during usual hours. Emergency evaluation can be requested by paging [phone]. Critical Value/emergent results were called by telephone at the time of interpretation on 11/24/2017 at 2:55 am to Dr. Delora Fuel , who verbally acknowledged these results. Aortic Atherosclerosis (ICD10-I70.0). Electronically Signed   By: Elon Alas M.D.   On: 11/24/2017 02:56   Ct Angio Neck W And/or Wo Contrast  Result Date: 11/24/2017 CLINICAL DATA:  Acute  onset severe headache while doing dishes. Posterior neck pain. History of hypertension. EXAM: CT ANGIOGRAPHY HEAD AND NECK TECHNIQUE: Multidetector CT imaging of the head and neck was performed using the standard protocol during bolus administration of intravenous contrast. Multiplanar CT image reconstructions and MIPs were obtained to evaluate the vascular anatomy. Carotid stenosis measurements (when applicable) are obtained utilizing NASCET criteria, using the distal internal carotid diameter as the denominator. CONTRAST:  127mL ISOVUE-370 IOPAMIDOL (ISOVUE-370) INJECTION 76% COMPARISON:  None. FINDINGS: CT HEAD FINDINGS BRAIN: No intraparenchymal hemorrhage, mass effect nor midline shift. Moderate ventriculomegaly, trace dependent density in the occipital horns. Patchy supratentorial white matter hypodensities within normal range for patient's age, though non-specific are most compatible with chronic small vessel ischemic disease. LEFT inferior basal ganglia perivascular space. No acute large vascular territory infarcts. No abnormal extra-axial fluid collections. Basal cisterns are patent. VASCULAR: Minimal calcific atherosclerosis of the carotid siphons. SKULL: No skull fracture. No significant scalp soft tissue swelling. SINUSES/ORBITS: The mastoid air-cells and included paranasal sinuses are well-aerated.The included ocular globes and orbital contents are non-suspicious. Status post bilateral ocular lens implants. OTHER: None. CTA NECK FINDINGS: AORTIC ARCH: Normal appearance of the thoracic arch, normal branch pattern. Mild calcific atherosclerosis aortic arch. The origins of the innominate, left Common carotid artery and subclavian artery are  widely patent. RIGHT CAROTID SYSTEM: Common carotid artery is patent. Normal appearance of the carotid bifurcation without hemodynamically significant stenosis by NASCET criteria. Normal appearance of the internal carotid artery. LEFT CAROTID SYSTEM: Common carotid  artery is patent. Trace calcific atherosclerosis carotid bifurcation without hemodynamically significant stenosis by NASCET criteria. Normal appearance of the internal carotid artery. VERTEBRAL ARTERIES:Left vertebral artery is dominant. Normal appearance of the vertebral arteries, widely patent. SKELETON: No acute osseous process though bone windows have not been submitted. Moderate C5-6 degenerative disc and moderate LEFT C4-5 facet arthropathy. Moderate to severe LEFT C4-5 and C5-6 neural foraminal narrowing. OTHER NECK: Soft tissues of the neck are nonacute though, not tailored for evaluation. UPPER CHEST: RIGHT upper lobe ground-glass opacity measuring approximately 16 mm. CTA HEAD FINDINGS: ANTERIOR CIRCULATION: Patent cervical internal carotid arteries, petrous, cavernous and supra clinoid internal carotid arteries. 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 Patent anterior communicating artery. Patent anterior and middle cerebral arteries. No large vessel occlusion, significant stenosis, contrast extravasation. POSTERIOR CIRCULATION: Patent vertebral arteries, vertebrobasilar junction and basilar artery, as well as main branch vessels. Patent posterior cerebral arteries. No large vessel occlusion, significant stenosis, contrast extravasation or aneurysm. VENOUS SINUSES: Major dural venous sinuses are patent though not tailored for evaluation on this angiographic examination. ANATOMIC VARIANTS: Duplicated RIGHT PCA. DELAYED PHASE: No abnormal intracranial enhancement. MIP images reviewed. IMPRESSION: CT HEAD: 1. Trace dependent intraventricular blood products (redistributed subarachnoid hemorrhage) or purulent material. 2. Otherwise negative CT HEAD with and without contrast for age. CTA NECK: 1. No hemodynamically significant stenosis or acute vascular process in the neck. 2. RIGHT upper lobe ground-glass with and may be infectious or inflammatory. Recommend follow-up. 3. Moderate  to severe LEFT C4-5 and C5-6 neural foraminal narrowing. CTA HEAD: 1. No emergent large vessel occlusion or flow limiting stenosis. 2. 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 Neuro-Interventional Radiology consultation is suggested to evaluate the appropriateness of potential treatment. Non-emergent evaluation can be arranged by calling [phone] during usual hours. Emergency evaluation can be requested by paging [phone]. Critical Value/emergent results were called by telephone at the time of interpretation on 11/24/2017 at 2:55 am to Dr. Delora Fuel , who verbally acknowledged these results. Aortic Atherosclerosis (ICD10-I70.0). Electronically Signed   By: Elon Alas M.D.   On: 11/24/2017 02:56    Procedures Procedures  CRITICAL CARE Performed by: Delora Fuel Total critical care time: 45 minutes Critical care time was exclusive of separately billable procedures and treating other patients. Critical care was necessary to treat or prevent imminent or life-threatening deterioration. Critical care was time spent personally by me on the following activities: development of treatment plan with patient and/or surrogate as well as nursing, discussions with consultants, evaluation of patient's response to treatment, examination of patient, obtaining history from patient or surrogate, ordering and performing treatments and interventions, ordering and review of laboratory studies, ordering and review of radiographic studies, pulse oximetry and re-evaluation of patient's condition.  Medications Ordered in ED Medications  sodium chloride 0.9 % bolus 1,000 mL (0 mLs Intravenous Stopped 11/24/17 0352)  metoCLOPramide (REGLAN) injection 10 mg (10 mg Intravenous Given 11/24/17 0120)  diphenhydrAMINE (BENADRYL) injection 25 mg (25 mg Intravenous Given 11/24/17 0120)  iopamidol (ISOVUE-370) 76 % injection 100 mL (100 mLs Intravenous Contrast Given 11/24/17 0225)       Initial Impression / Assessment and Plan / ED Course  I have reviewed the triage vital signs and the nursing notes.  Pertinent labs & imaging results that were available during my care of the patient were reviewed by me and considered in my medical decision making (see chart for details).  Abrupt onset of severe headache.  This is certainly worrisome for subarachnoid hemorrhage.  However, with symptoms improving over a relatively short period of time, this is felt to be somewhat less likely.  Tenderness over neck muscles is suggestive of muscle contraction headache.  She will be sent for CT of head as well as CT angiograms.  She will also be given headache cocktail of normal saline, metoclopramide, diphenhydramine.  Old records are reviewed, and she has no relevant past visits.  3:01 AM Headache is significantly improved, but worse if she starts to move in any way.  I discussed her CT findings with radiologist, and there appears to be some blood in the ventricles and most likely source is a small posterior communicating artery aneurysm.  Blood pressure is normal at this point.  Neurosurgery is consulted.  3:53 AM Case discussed with Dr. Sherwood Gambler, who has reviewed the images.  He questions whether the small amount of blood in the ventricle was sufficient to account for the severity of her headache.  He is coming in to evaluate the patient, but will admit her to the neuro ICU at [facility].  This information has been discussed with patient and with family.  Final Clinical Impressions(s) / ED Diagnoses   Final diagnoses:  Posterior communicating artery aneurysm  Intraventricular hemorrhage [facility])    ED Discharge Orders    None       Delora Fuel, MD 54/27/06 [phone]

## 2017-11-24 NOTE — H&P (Signed)
Subjective: Patient is a 84 y.o. right-handed white female who is admitted for treatment of small intraventricular hemorrhage, acute and sudden severe headache associated with neck pain, and possible small left posterior communicating artery aneurysm.  Patient has been in her usual state of health, with a history of hypertension, PVCs, a mild murmur, asthma most typically provoked by the winter, GERD, and a recent melanoma resection, who this evening got up to finish washing her dishes and placing them in the dishwasher, and developed severe sudden bilateral viselike headache.  Patient's daughter called EMS, the patient was brought to the [facility] emergency room.  She was evaluated by Dr. Delora Fuel who obtained a CT of the head and a CT angiogram of the head.  These showed a fluid fluid levels in the occipital horns consistent with very small amount of intraventricular hemorrhage, no evidence of subarachnoid hemorrhage within the basal cisterns was seen, but the CT angiogram is suspicious for a small left posterior communicating artery aneurysm.  Neurosurgical consultation was requested, and the patient was seen and is being transferred and admitted to the 4 N. neurosurgical ICU at [facility].  Symptomatically she denies any double vision, blurred vision, weakness, seizures, or vomiting.  She had a very brief, small amount of nausea when the headache first developed, but none at this time.  She does complain of posterior neck pain, the headache is aggravated by her moving.   Patient Active Problem List   Diagnosis Date Noted  . Posterior communicating artery aneurysm 11/24/2017  . Irregular heart beat 08/07/2017  . Mild intermittent asthma without complication 64/33/2951  . Need for prophylactic vaccination and inoculation against influenza 04/17/2017  . Heart murmur 12/08/2015  . Family history of colon cancer 01/03/2013  . Hearing loss of aging 01/03/2013  . Essential  hypertension 05/28/2007  . Osteoporosis 05/28/2007   Past Medical History:  Diagnosis Date  . Actinic keratosis   . ASTHMA   . Dermatophytosis of nail   . DJD (degenerative joint disease)   . History of shingles   . HYPERTENSION   . OSTEOPOROSIS   . PALPITATIONS, RECURRENT     Past Surgical History:  Procedure Laterality Date  . BREAST EXCISIONAL BIOPSY Left 2004     (Not in a hospital admission) Allergies  Allergen Reactions  . Amoxicillin Hives, Itching and Rash    Has patient had a PCN reaction causing immediate rash, facial/tongue/throat swelling, SOB or lightheadedness with hypotension: Yes Has patient had a PCN reaction causing severe rash involving mucus membranes or skin necrosis: No Has patient had a PCN reaction that required hospitalization: No Has patient had a PCN reaction occurring within the last 10 years: No If all of the above answers are "NO", then may proceed with Cephalosporin use.   . Cephalexin Hives, Itching and Rash  . Doxycycline Rash    Social History   Tobacco Use  . Smoking status: Former Research scientist (life sciences)  . Smokeless tobacco: Former Network engineer Use Topics  . 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   Family History  Problem Relation Age of Onset  . Arthritis Other   . Heart disease Other   . Cancer Other        colon  . Diabetes Other   . Breast cancer Maternal Aunt      Review of Systems Pertinent items noted in HPI and remainder of comprehensive ROS otherwise negative.  Objective: Vital  signs in last 24 hours: Temp:  [98.1 F (36.7 C)] 98.1 F (36.7 C) (04/26 0007) Pulse Rate:  [65-77] 77 (04/26 0404) Resp:  [12-16] 16 (04/26 0404) BP: (121-168)/(70-109) 136/87 (04/26 0404) SpO2:  [95 %-98 %] 97 % (04/26 0404) Weight:  [83.9 kg (185 lb)] 83.9 kg (185 lb) (04/26 0039)  EXAM: Patient is a well-developed well-nourished white female in no acute distress.   Lungs are clear to auscultation , the  patient has symmetrical respiratory excursion. Heart has a regular rate and rhythm normal S1 and S2 no murmur.   Abdomen is soft nontender nondistended bowel sounds are present. Extremity examination shows no clubbing cyanosis or edema. Nuchal examination shows no meningismus. Mental status shows the patient is awake and alert, oriented to name, [facility], and April 2019.  She follows commands.  Her speech is fluent. Cranial nerves show pupils are equal, round, reactive to light, and about 3.5 mm bilaterally.  EOMI.  Facial sensation intact, facial movement symmetrical.  Hearing is present bilaterally.  Palatal movement is symmetrical.  Shoulder shrug is symmetrical.  Tongue is midline. Motor examination shows 5/5 strength in the upper and lower extremities.  There is no drift of the upper extremities. Sensory examination is intact to pinprick throughout.  Reflex examination is symmetrical. Gait and stance were not tested due to the nature of the patient's condition.  Data Review:CBC    Component Value Date/Time   WBC 8.5 11/24/2017 0126   RBC 4.61 11/24/2017 0126   HGB 14.2 11/24/2017 0126   HCT 42.6 11/24/2017 0126   PLT 217 11/24/2017 0126   MCV 92.4 11/24/2017 0126   MCH 30.8 11/24/2017 0126   MCHC 33.3 11/24/2017 0126   RDW 13.0 11/24/2017 0126   LYMPHSABS 2.6 11/24/2017 0126   MONOABS 0.8 11/24/2017 0126   EOSABS 0.4 11/24/2017 0126   BASOSABS 0.1 11/24/2017 0126                          BMET    Component Value Date/Time   NA 142 11/24/2017 0126   K 3.9 11/24/2017 0126   CL 105 11/24/2017 0126   CO2 25 11/24/2017 0126   GLUCOSE 114 (H) 11/24/2017 0126   GLUCOSE 98 08/07/2006 1016   BUN 16 11/24/2017 0126   CREATININE 0.88 11/24/2017 0126   CALCIUM 9.5 11/24/2017 0126   GFRNONAA 58 (L) 11/24/2017 0126   GFRAA >60 11/24/2017 0126     Assessment/Plan: Patient with sudden severe headache earlier this evening, who has been found to have a small amount of  intraventricular hemorrhage, and whose CT angiogram is suspicious for a small left posterior communicating artery aneurysm.  I am not convinced that the small amount of intraventricular hemorrhage is due to rupture of the suspected aneurysm, since the CT of the head shows no evidence of subarachnoid hemorrhage within the basal cisterns, and therefore the intraventricular hemorrhage may be of a venous nature, and the aneurysm may be coincidental.    She has a history of hypertension, PVCs, heart murmur, asthma, GERD, and melanoma.  Patient is being transferred and admitted to 4 [address]. neurosurgical ICU at [facility].  We will plan on IV hydration, continuing her blood pressure medication, and supportive care.  She will ultimately require a cerebral arteriogram.  Both with the patient and her daughter Lovey Newcomer who was at the bedside about her condition, I reviewed her CT images with her daughter.  Their questions  were answered for them.   Hosie Spangle, MD 11/24/2017 4:39 AM

## 2017-11-24 NOTE — Sedation Documentation (Addendum)
MD placing angioseal to R groin- will be holding pressure

## 2017-11-24 NOTE — Sedation Documentation (Signed)
Pressure hold complete, VPad R groin, Bedrest starting

## 2017-11-24 NOTE — Consult Note (Signed)
Chief Complaint: Patient was seen in consultation today for intraventricular hemorrhage.  Referring Physician(s): Jovita Gamma  Supervising Physician: Luanne Bras  Patient Status: [facility] - In-pt  History of Present Illness: Taylor Blankenship is a 84 y.o. female  Hx hypertension, asthma, and osteoporosis.  Presented to [facility] ED early this AM with complaints of headache and neck pain- she was diagnosed with a small intraventricular hemorrhage.  CTA head/neck 11/24/2017: 1. Trace dependent intraventricular blood products (redistributed subarachnoid hemorrhage) or purulent material. 2. Otherwise negative CT HEAD with and without contrast for age. 3. No hemodynamically significant stenosis or acute vascular process in the neck. 4. RIGHT upper lobe ground-glass with and may be infectious or inflammatory. Recommend follow-up. 5. Moderate to severe LEFT C4-5 and C5-6 neural foraminal narrowing. 6. No emergent large vessel occlusion or flow limiting stenosis. 7. 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 Neuro-Interventional Radiology consultation is suggested to evaluate the appropriateness of potential treatment. Non-emergent evaluation can be arranged by calling [phone] during usual hours. Emergency evaluation can be requested by paging [phone]. Critical Value/emergent results were called by telephone at the time of interpretation on 11/24/2017 at 2:55 am to Dr. Delora Fuel , who verbally acknowledged these results. 8. Aortic Atherosclerosis (ICD10-I70.0).  IR consulted by Dr. Sherwood Gambler for possible image-guided diagnostic cerebral angiogram. Patient awake and alert laying in bed with no complaints at this time. Accompanied by daughter at bedside. Denies headache, weakness, dizziness, numbness/tingling, vision changes, hearing changes, tinnitus, or speech difficulty.  Past Medical History:  Diagnosis Date  . Actinic  keratosis   . ASTHMA   . Dermatophytosis of nail   . DJD (degenerative joint disease)   . History of shingles   . HYPERTENSION   . OSTEOPOROSIS   . PALPITATIONS, RECURRENT     Past Surgical History:  Procedure Laterality Date  . BREAST EXCISIONAL BIOPSY Left 2004    Allergies: Amoxicillin; Cephalexin; and Doxycycline  Medications: Prior to Admission medications   Medication Sig Start Date End Date Taking? Authorizing Provider  atenolol (TENORMIN) 25 MG tablet Take  1 tablet by mouth daily. 04/17/17  Yes Dorena Cookey, MD  beclomethasone (QVAR REDIHALER) 40 MCG/ACT inhaler Inhale 2 puffs into the lungs 2 (two) times daily. 08/07/17  Yes Dorena Cookey, MD  Calcium Carbonate (CALCIUM 600) 1500 MG TABS Take by mouth daily. As directed    Yes [provider]  cetirizine (ZYRTEC) 10 MG tablet Take 10 mg by mouth daily.   Yes [provider]  Cholecalciferol (VITAMIN D) 1000 UNITS capsule Take 1,000 Units by mouth daily.     Yes [provider]  hydrochlorothiazide (MICROZIDE) 12.5 MG capsule TAKE 1 CAPSULE BY MOUTH EVERY DAY 04/17/17  Yes Dorena Cookey, MD  Multiple Vitamin (MULTIVITAMIN) tablet Take 1 tablet by mouth daily.     Yes [provider]  Omega-3 Fatty Acids (FISH OIL) 300 MG CAPS Take by mouth daily.     Yes [provider]  triamcinolone (KENALOG) 0.025 % ointment Apply small amounts twice daily Patient not taking: Reported on 11/24/2017 10/29/15   Marin Olp, MD     Family History  Problem Relation Age of Onset  . Arthritis Other   . Heart disease Other   . Cancer Other        colon  . Diabetes Other   . Breast cancer Maternal Aunt     Social History   Socioeconomic History  .  Marital status: Married    Spouse name: Not on file  . Number of children: Not on file  . Years of education: Not on file  . Highest education level: Not on file  Occupational History  . Not on file  Social Needs  . Financial  resource strain: Not on file  . Food insecurity:    Worry: Not on file    Inability: Not on file  . Transportation needs:    Medical: Not on file    Non-medical: Not on file  Tobacco Use  . Smoking status: Former Research scientist (life sciences)  . Smokeless tobacco: Former Network engineer and Sexual Activity  . Alcohol use: Yes  . Drug use: No  . Sexual activity: Not on file  Lifestyle  . Physical activity:    Days per week: Not on file    Minutes per session: Not on file  . Stress: Not on file  Relationships  . Social connections:    Talks on phone: Not on file    Gets together: Not on file    Attends religious service: Not on file    Active member of club or organization: Not on file    Attends meetings of clubs or organizations: Not on file    Relationship status: Not on file  Other Topics Concern  . Not on file  Social History Narrative  . Not on file     Review of Systems: A 12 point ROS discussed and pertinent positives are indicated in the HPI above.  All other systems are negative.  Review of Systems  Constitutional: Negative for activity change and fever.  HENT: Negative for hearing loss and tinnitus.   Eyes: Negative for visual disturbance.  Respiratory: Negative for shortness of breath and wheezing.   Cardiovascular: Negative for chest pain and palpitations.  Neurological: Negative for dizziness, speech difficulty, weakness, numbness and headaches.  Psychiatric/Behavioral: Negative for behavioral problems and confusion.    Vital Signs: BP (!) 102/49   Pulse 63   Temp 98.3 F (36.8 C) (Oral)   Resp 15   Ht 5\' 2"  (1.575 m)   Wt 185 lb (83.9 kg)   SpO2 94%   BMI 33.84 kg/m   Physical Exam  Constitutional: She is oriented to person, place, and time. She appears well-developed and well-nourished. No distress.  Cardiovascular: Normal rate, regular rhythm, normal heart sounds and intact distal pulses.  No murmur heard. Pulmonary/Chest: Effort normal and breath sounds normal.  She has no wheezes.  Neurological: She is alert and oriented to person, place, and time.  Skin: Skin is warm and dry.  Psychiatric: She has a normal mood and affect. Her behavior is normal. Judgment and thought content normal.  Nursing note and vitals reviewed.    MD Evaluation Airway: WNL Heart: WNL Abdomen: WNL Chest/ Lungs: WNL ASA  Classification: 2 Mallampati/Airway Score: Two   Imaging: Ct Angio Head W Or Wo Contrast  Result Date: 11/24/2017 CLINICAL DATA:  Acute onset severe headache while doing dishes. Posterior neck pain. History of hypertension. EXAM: CT ANGIOGRAPHY HEAD AND NECK TECHNIQUE: Multidetector CT imaging of the head and neck was performed using the standard protocol during bolus administration of intravenous contrast. Multiplanar CT image reconstructions and MIPs were obtained to evaluate the vascular anatomy. Carotid stenosis measurements (when applicable) are obtained utilizing NASCET criteria, using the distal internal carotid diameter as the denominator. CONTRAST:  130mL ISOVUE-370 IOPAMIDOL (ISOVUE-370) INJECTION 76% COMPARISON:  None. FINDINGS: CT HEAD FINDINGS BRAIN: No intraparenchymal hemorrhage,  mass effect nor midline shift. Moderate ventriculomegaly, trace dependent density in the occipital horns. Patchy supratentorial white matter hypodensities within normal range for patient's age, though non-specific are most compatible with chronic small vessel ischemic disease. LEFT inferior basal ganglia perivascular space. No acute large vascular territory infarcts. No abnormal extra-axial fluid collections. Basal cisterns are patent. VASCULAR: Minimal calcific atherosclerosis of the carotid siphons. SKULL: No skull fracture. No significant scalp soft tissue swelling. SINUSES/ORBITS: The mastoid air-cells and included paranasal sinuses are well-aerated.The included ocular globes and orbital contents are non-suspicious. Status post bilateral ocular lens implants. OTHER:  None. CTA NECK FINDINGS: AORTIC ARCH: Normal appearance of the thoracic arch, normal branch pattern. Mild calcific atherosclerosis aortic arch. The origins of the innominate, left Common carotid artery and subclavian artery are widely patent. RIGHT CAROTID SYSTEM: Common carotid artery is patent. Normal appearance of the carotid bifurcation without hemodynamically significant stenosis by NASCET criteria. Normal appearance of the internal carotid artery. LEFT CAROTID SYSTEM: Common carotid artery is patent. Trace calcific atherosclerosis carotid bifurcation without hemodynamically significant stenosis by NASCET criteria. Normal appearance of the internal carotid artery. VERTEBRAL ARTERIES:Left vertebral artery is dominant. Normal appearance of the vertebral arteries, widely patent. SKELETON: No acute osseous process though bone windows have not been submitted. Moderate C5-6 degenerative disc and moderate LEFT C4-5 facet arthropathy. Moderate to severe LEFT C4-5 and C5-6 neural foraminal narrowing. OTHER NECK: Soft tissues of the neck are nonacute though, not tailored for evaluation. UPPER CHEST: RIGHT upper lobe ground-glass opacity measuring approximately 16 mm. CTA HEAD FINDINGS: ANTERIOR CIRCULATION: Patent cervical internal carotid arteries, petrous, cavernous and supra clinoid internal carotid arteries. 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 Patent anterior communicating artery. Patent anterior and middle cerebral arteries. No large vessel occlusion, significant stenosis, contrast extravasation. POSTERIOR CIRCULATION: Patent vertebral arteries, vertebrobasilar junction and basilar artery, as well as main branch vessels. Patent posterior cerebral arteries. No large vessel occlusion, significant stenosis, contrast extravasation or aneurysm. VENOUS SINUSES: Major dural venous sinuses are patent though not tailored for evaluation on this angiographic examination. ANATOMIC VARIANTS:  Duplicated RIGHT PCA. DELAYED PHASE: No abnormal intracranial enhancement. MIP images reviewed. IMPRESSION: CT HEAD: 1. Trace dependent intraventricular blood products (redistributed subarachnoid hemorrhage) or purulent material. 2. Otherwise negative CT HEAD with and without contrast for age. CTA NECK: 1. No hemodynamically significant stenosis or acute vascular process in the neck. 2. RIGHT upper lobe ground-glass with and may be infectious or inflammatory. Recommend follow-up. 3. Moderate to severe LEFT C4-5 and C5-6 neural foraminal narrowing. CTA HEAD: 1. No emergent large vessel occlusion or flow limiting stenosis. 2. 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 Neuro-Interventional Radiology consultation is suggested to evaluate the appropriateness of potential treatment. Non-emergent evaluation can be arranged by calling [phone] during usual hours. Emergency evaluation can be requested by paging [phone]. Critical Value/emergent results were called by telephone at the time of interpretation on 11/24/2017 at 2:55 am to Dr. Delora Fuel , who verbally acknowledged these results. Aortic Atherosclerosis (ICD10-I70.0). Electronically Signed   By: Elon Alas M.D.   On: 11/24/2017 02:56   Ct Angio Neck W And/or Wo Contrast  Result Date: 11/24/2017 CLINICAL DATA:  Acute onset severe headache while doing dishes. Posterior neck pain. History of hypertension. EXAM: CT ANGIOGRAPHY HEAD AND NECK TECHNIQUE: Multidetector CT imaging of the head and neck was performed using the standard protocol during bolus administration of intravenous contrast. Multiplanar CT image reconstructions and MIPs were  obtained to evaluate the vascular anatomy. Carotid stenosis measurements (when applicable) are obtained utilizing NASCET criteria, using the distal internal carotid diameter as the denominator. CONTRAST:  169mL ISOVUE-370 IOPAMIDOL (ISOVUE-370) INJECTION 76% COMPARISON:   None. FINDINGS: CT HEAD FINDINGS BRAIN: No intraparenchymal hemorrhage, mass effect nor midline shift. Moderate ventriculomegaly, trace dependent density in the occipital horns. Patchy supratentorial white matter hypodensities within normal range for patient's age, though non-specific are most compatible with chronic small vessel ischemic disease. LEFT inferior basal ganglia perivascular space. No acute large vascular territory infarcts. No abnormal extra-axial fluid collections. Basal cisterns are patent. VASCULAR: Minimal calcific atherosclerosis of the carotid siphons. SKULL: No skull fracture. No significant scalp soft tissue swelling. SINUSES/ORBITS: The mastoid air-cells and included paranasal sinuses are well-aerated.The included ocular globes and orbital contents are non-suspicious. Status post bilateral ocular lens implants. OTHER: None. CTA NECK FINDINGS: AORTIC ARCH: Normal appearance of the thoracic arch, normal branch pattern. Mild calcific atherosclerosis aortic arch. The origins of the innominate, left Common carotid artery and subclavian artery are widely patent. RIGHT CAROTID SYSTEM: Common carotid artery is patent. Normal appearance of the carotid bifurcation without hemodynamically significant stenosis by NASCET criteria. Normal appearance of the internal carotid artery. LEFT CAROTID SYSTEM: Common carotid artery is patent. Trace calcific atherosclerosis carotid bifurcation without hemodynamically significant stenosis by NASCET criteria. Normal appearance of the internal carotid artery. VERTEBRAL ARTERIES:Left vertebral artery is dominant. Normal appearance of the vertebral arteries, widely patent. SKELETON: No acute osseous process though bone windows have not been submitted. Moderate C5-6 degenerative disc and moderate LEFT C4-5 facet arthropathy. Moderate to severe LEFT C4-5 and C5-6 neural foraminal narrowing. OTHER NECK: Soft tissues of the neck are nonacute though, not tailored for  evaluation. UPPER CHEST: RIGHT upper lobe ground-glass opacity measuring approximately 16 mm. CTA HEAD FINDINGS: ANTERIOR CIRCULATION: Patent cervical internal carotid arteries, petrous, cavernous and supra clinoid internal carotid arteries. 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 Patent anterior communicating artery. Patent anterior and middle cerebral arteries. No large vessel occlusion, significant stenosis, contrast extravasation. POSTERIOR CIRCULATION: Patent vertebral arteries, vertebrobasilar junction and basilar artery, as well as main branch vessels. Patent posterior cerebral arteries. No large vessel occlusion, significant stenosis, contrast extravasation or aneurysm. VENOUS SINUSES: Major dural venous sinuses are patent though not tailored for evaluation on this angiographic examination. ANATOMIC VARIANTS: Duplicated RIGHT PCA. DELAYED PHASE: No abnormal intracranial enhancement. MIP images reviewed. IMPRESSION: CT HEAD: 1. Trace dependent intraventricular blood products (redistributed subarachnoid hemorrhage) or purulent material. 2. Otherwise negative CT HEAD with and without contrast for age. CTA NECK: 1. No hemodynamically significant stenosis or acute vascular process in the neck. 2. RIGHT upper lobe ground-glass with and may be infectious or inflammatory. Recommend follow-up. 3. Moderate to severe LEFT C4-5 and C5-6 neural foraminal narrowing. CTA HEAD: 1. No emergent large vessel occlusion or flow limiting stenosis. 2. 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 Neuro-Interventional Radiology consultation is suggested to evaluate the appropriateness of potential treatment. Non-emergent evaluation can be arranged by calling [phone] during usual hours. Emergency evaluation can be requested by paging [phone]. Critical Value/emergent results were called by telephone at the time of interpretation on 11/24/2017 at 2:55 am to  Dr. Delora Fuel , who verbally acknowledged these results. Aortic Atherosclerosis (ICD10-I70.0). Electronically Signed   By: Elon Alas M.D.   On: 11/24/2017 02:56    Labs:  CBC: Recent Labs    04/17/17 0929 11/24/17 0126  WBC 5.6  8.5  HGB 13.8 14.2  HCT 41.2 42.6  PLT 208.0 217    COAGS: No results for input(s): INR, APTT in the last 8760 hours.  BMP: Recent Labs    04/17/17 0929 11/24/17 0126  NA 139 142  K 3.9 3.9  CL 103 105  CO2 30 25  GLUCOSE 94 114*  BUN 18 16  CALCIUM 9.5 9.5  CREATININE 0.83 0.88  GFRNONAA  --  58*  GFRAA  --  >60    LIVER FUNCTION TESTS: No results for input(s): BILITOT, AST, ALT, ALKPHOS, PROT, ALBUMIN in the last 8760 hours.  TUMOR MARKERS: No results for input(s): AFPTM, CEA, CA199, CHROMGRNA in the last 8760 hours.  Assessment and Plan:  Intraventricular hemorrhage. Plan for image-guided diagnostic cerebral angiogram this AM with Dr. Estanislado Pandy. Patient is NPO. She does not take blood thinners. Denies fever and WBCs WNL. Cr 0.88 mg/dL this AM. INR pending.  Patient's daughter, Aaniya Sterba, signed consent as she is patient's power of attorney.  Risks and benefits of diagnostic cerebral angiogram were discussed with the patient including, but not limited to bleeding, infection, vascular injury or contrast induced renal failure. This interventional procedure involves the use of X-rays and because of the nature of the planned procedure, it is possible that we will have prolonged use of X-ray fluoroscopy. Potential radiation risks to you include (but are not limited to) the following: - A slightly elevated risk for cancer  several years later in life. This risk is typically less than 0.5% percent. This risk is low in comparison to the normal incidence of human cancer, which is 33% for women and 50% for men according to the [place]. - Radiation induced injury can include skin redness, resembling a rash, tissue  breakdown / ulcers and hair loss (which can be temporary or permanent).  The likelihood of either of these occurring depends on the difficulty of the procedure and whether you are sensitive to radiation due to previous procedures, disease, or genetic conditions.  IF your procedure requires a prolonged use of radiation, you will be notified and given written instructions for further action.  It is your responsibility to monitor the irradiated area for the 2 weeks following the procedure and to notify your physician if you are concerned that you have suffered a radiation induced injury.   All of the patient's questions were answered, patient is agreeable to proceed. Consent signed and in chart.  Thank you for this interesting consult.  I greatly enjoyed meeting Naesha E Metzinger and look forward to participating in their care.  A copy of this report was sent to the requesting provider on this date.  Electronically Signed: Earley Abide, PA-C 11/24/2017, 8:49 AM   I spent a total of 30 minutes in face to face in clinical consultation, greater than 50% of which was counseling/coordinating care for intraventricular hemorrhage.

## 2017-11-24 NOTE — ED Notes (Signed)
Pt left via [facility] to [facility].

## 2017-11-24 NOTE — Progress Notes (Signed)
Subjective: Patient resting in bed, still having neck pain, but no headache.  No nausea, or other complaints at this time.  Objective: Vital signs in last 24 hours: Vitals:   11/24/17 0530 11/24/17 0600 11/24/17 0630 11/24/17 0700  BP: (!) 126/49 (!) 129/53 (!) 115/43 (!) 102/49  Pulse: 68 64 64 63  Resp: 19 14 13 15   Temp:      TempSrc:      SpO2: 97% 94% 90% 94%  Weight:      Height:        Intake/Output from previous day: 04/25 0701 - 04/26 0700 In: 31.3 [I.V.:31.3] Out: -  Intake/Output this shift: No intake/output data recorded.  Physical Exam: Awake alert, oriented.  Following commands.  Speech fluent.  Moving all 4 extremities well.  CBC Recent Labs    11/24/17 0126  WBC 8.5  HGB 14.2  HCT 42.6  PLT 217   BMET Recent Labs    11/24/17 0126  NA 142  K 3.9  CL 105  CO2 25  GLUCOSE 114*  BUN 16  CREATININE 0.88  CALCIUM 9.5    Studies/Results: Ct Angio Head W Or Wo Contrast  Result Date: 11/24/2017 CLINICAL DATA:  Acute onset severe headache while doing dishes. Posterior neck pain. History of hypertension. EXAM: CT ANGIOGRAPHY HEAD AND NECK TECHNIQUE: Multidetector CT imaging of the head and neck was performed using the standard protocol during bolus administration of intravenous contrast. Multiplanar CT image reconstructions and MIPs were obtained to evaluate the vascular anatomy. Carotid stenosis measurements (when applicable) are obtained utilizing NASCET criteria, using the distal internal carotid diameter as the denominator. CONTRAST:  16mL ISOVUE-370 IOPAMIDOL (ISOVUE-370) INJECTION 76% COMPARISON:  None. FINDINGS: CT HEAD FINDINGS BRAIN: No intraparenchymal hemorrhage, mass effect nor midline shift. Moderate ventriculomegaly, trace dependent density in the occipital horns. Patchy supratentorial white matter hypodensities within normal range for patient's age, though non-specific are most compatible with chronic small vessel ischemic disease. LEFT  inferior basal ganglia perivascular space. No acute large vascular territory infarcts. No abnormal extra-axial fluid collections. Basal cisterns are patent. VASCULAR: Minimal calcific atherosclerosis of the carotid siphons. SKULL: No skull fracture. No significant scalp soft tissue swelling. SINUSES/ORBITS: The mastoid air-cells and included paranasal sinuses are well-aerated.The included ocular globes and orbital contents are non-suspicious. Status post bilateral ocular lens implants. OTHER: None. CTA NECK FINDINGS: AORTIC ARCH: Normal appearance of the thoracic arch, normal branch pattern. Mild calcific atherosclerosis aortic arch. The origins of the innominate, left Common carotid artery and subclavian artery are widely patent. RIGHT CAROTID SYSTEM: Common carotid artery is patent. Normal appearance of the carotid bifurcation without hemodynamically significant stenosis by NASCET criteria. Normal appearance of the internal carotid artery. LEFT CAROTID SYSTEM: Common carotid artery is patent. Trace calcific atherosclerosis carotid bifurcation without hemodynamically significant stenosis by NASCET criteria. Normal appearance of the internal carotid artery. VERTEBRAL ARTERIES:Left vertebral artery is dominant. Normal appearance of the vertebral arteries, widely patent. SKELETON: No acute osseous process though bone windows have not been submitted. Moderate C5-6 degenerative disc and moderate LEFT C4-5 facet arthropathy. Moderate to severe LEFT C4-5 and C5-6 neural foraminal narrowing. OTHER NECK: Soft tissues of the neck are nonacute though, not tailored for evaluation. UPPER CHEST: RIGHT upper lobe ground-glass opacity measuring approximately 16 mm. CTA HEAD FINDINGS: ANTERIOR CIRCULATION: Patent cervical internal carotid arteries, petrous, cavernous and supra clinoid internal carotid arteries. 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 Patent anterior communicating artery. Patent anterior  and middle cerebral  arteries. No large vessel occlusion, significant stenosis, contrast extravasation. POSTERIOR CIRCULATION: Patent vertebral arteries, vertebrobasilar junction and basilar artery, as well as main branch vessels. Patent posterior cerebral arteries. No large vessel occlusion, significant stenosis, contrast extravasation or aneurysm. VENOUS SINUSES: Major dural venous sinuses are patent though not tailored for evaluation on this angiographic examination. ANATOMIC VARIANTS: Duplicated RIGHT PCA. DELAYED PHASE: No abnormal intracranial enhancement. MIP images reviewed. IMPRESSION: CT HEAD: 1. Trace dependent intraventricular blood products (redistributed subarachnoid hemorrhage) or purulent material. 2. Otherwise negative CT HEAD with and without contrast for age. CTA NECK: 1. No hemodynamically significant stenosis or acute vascular process in the neck. 2. RIGHT upper lobe ground-glass with and may be infectious or inflammatory. Recommend follow-up. 3. Moderate to severe LEFT C4-5 and C5-6 neural foraminal narrowing. CTA HEAD: 1. No emergent large vessel occlusion or flow limiting stenosis. 2. 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 Neuro-Interventional Radiology consultation is suggested to evaluate the appropriateness of potential treatment. Non-emergent evaluation can be arranged by calling [phone] during usual hours. Emergency evaluation can be requested by paging [phone]. Critical Value/emergent results were called by telephone at the time of interpretation on 11/24/2017 at 2:55 am to Dr. Delora Fuel , who verbally acknowledged these results. Aortic Atherosclerosis (ICD10-I70.0). Electronically Signed   By: Elon Alas M.D.   On: 11/24/2017 02:56   Ct Angio Neck W And/or Wo Contrast  Result Date: 11/24/2017 CLINICAL DATA:  Acute onset severe headache while doing dishes. Posterior neck pain. History of hypertension. EXAM: CT  ANGIOGRAPHY HEAD AND NECK TECHNIQUE: Multidetector CT imaging of the head and neck was performed using the standard protocol during bolus administration of intravenous contrast. Multiplanar CT image reconstructions and MIPs were obtained to evaluate the vascular anatomy. Carotid stenosis measurements (when applicable) are obtained utilizing NASCET criteria, using the distal internal carotid diameter as the denominator. CONTRAST:  126mL ISOVUE-370 IOPAMIDOL (ISOVUE-370) INJECTION 76% COMPARISON:  None. FINDINGS: CT HEAD FINDINGS BRAIN: No intraparenchymal hemorrhage, mass effect nor midline shift. Moderate ventriculomegaly, trace dependent density in the occipital horns. Patchy supratentorial white matter hypodensities within normal range for patient's age, though non-specific are most compatible with chronic small vessel ischemic disease. LEFT inferior basal ganglia perivascular space. No acute large vascular territory infarcts. No abnormal extra-axial fluid collections. Basal cisterns are patent. VASCULAR: Minimal calcific atherosclerosis of the carotid siphons. SKULL: No skull fracture. No significant scalp soft tissue swelling. SINUSES/ORBITS: The mastoid air-cells and included paranasal sinuses are well-aerated.The included ocular globes and orbital contents are non-suspicious. Status post bilateral ocular lens implants. OTHER: None. CTA NECK FINDINGS: AORTIC ARCH: Normal appearance of the thoracic arch, normal branch pattern. Mild calcific atherosclerosis aortic arch. The origins of the innominate, left Common carotid artery and subclavian artery are widely patent. RIGHT CAROTID SYSTEM: Common carotid artery is patent. Normal appearance of the carotid bifurcation without hemodynamically significant stenosis by NASCET criteria. Normal appearance of the internal carotid artery. LEFT CAROTID SYSTEM: Common carotid artery is patent. Trace calcific atherosclerosis carotid bifurcation without hemodynamically  significant stenosis by NASCET criteria. Normal appearance of the internal carotid artery. VERTEBRAL ARTERIES:Left vertebral artery is dominant. Normal appearance of the vertebral arteries, widely patent. SKELETON: No acute osseous process though bone windows have not been submitted. Moderate C5-6 degenerative disc and moderate LEFT C4-5 facet arthropathy. Moderate to severe LEFT C4-5 and C5-6 neural foraminal narrowing. OTHER NECK: Soft tissues of the neck are nonacute though, not tailored for evaluation. UPPER CHEST: RIGHT upper lobe  ground-glass opacity measuring approximately 16 mm. CTA HEAD FINDINGS: ANTERIOR CIRCULATION: Patent cervical internal carotid arteries, petrous, cavernous and supra clinoid internal carotid arteries. 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 Patent anterior communicating artery. Patent anterior and middle cerebral arteries. No large vessel occlusion, significant stenosis, contrast extravasation. POSTERIOR CIRCULATION: Patent vertebral arteries, vertebrobasilar junction and basilar artery, as well as main branch vessels. Patent posterior cerebral arteries. No large vessel occlusion, significant stenosis, contrast extravasation or aneurysm. VENOUS SINUSES: Major dural venous sinuses are patent though not tailored for evaluation on this angiographic examination. ANATOMIC VARIANTS: Duplicated RIGHT PCA. DELAYED PHASE: No abnormal intracranial enhancement. MIP images reviewed. IMPRESSION: CT HEAD: 1. Trace dependent intraventricular blood products (redistributed subarachnoid hemorrhage) or purulent material. 2. Otherwise negative CT HEAD with and without contrast for age. CTA NECK: 1. No hemodynamically significant stenosis or acute vascular process in the neck. 2. RIGHT upper lobe ground-glass with and may be infectious or inflammatory. Recommend follow-up. 3. Moderate to severe LEFT C4-5 and C5-6 neural foraminal narrowing. CTA HEAD: 1. No emergent large vessel  occlusion or flow limiting stenosis. 2. 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 Neuro-Interventional Radiology consultation is suggested to evaluate the appropriateness of potential treatment. Non-emergent evaluation can be arranged by calling [phone] during usual hours. Emergency evaluation can be requested by paging [phone]. Critical Value/emergent results were called by telephone at the time of interpretation on 11/24/2017 at 2:55 am to Dr. Delora Fuel , who verbally acknowledged these results. Aortic Atherosclerosis (ICD10-I70.0). Electronically Signed   By: Elon Alas M.D.   On: 11/24/2017 02:56    Assessment/Plan: Patient continues to do well from a neurosurgical/neurologic perspective.  Case reviewed and discussed with both Dr. Ashok Pall and Dr. Kary Kos.  Both favor proceeding with diagnostic four-vessel cerebral arteriography today.  Discussed the patient's case and the request for diagnostic four-vessel cerebral arteriogram with Dr. Noreene Filbert.  Will review after is completed with Dr. Christella Noa and be able to make further recommendations.  Discussed plans with the patient and her daughter Lovey Newcomer who was at the bedside.  Hosie Spangle, MD 11/24/2017, 7:37 AM

## 2017-11-24 NOTE — Progress Notes (Signed)
Subjective: Patient sitting up in a chair at the side of the bed.  Underwent four-vessel cerebral arteriography with Dr. Kathee Delton today.  I reviewed the study, and the discussed it with Dr. Kathee Delton.  No aneurysm or vascular malformation was seen, however there is a left posterior communicating artery infundibulum, consistent with the findings on the CT angiogram.  Once the results of the arteriogram were obtained, patient was allowed to advance to a regular diet and begin to mobilize.  Objective: Vital signs in last 24 hours: Vitals:   11/24/17 1430 11/24/17 1500 11/24/17 1600 11/24/17 1700  BP: (!) 88/60 (!) 95/41 (!) 98/46 (!) 106/51  Pulse: 65 64 (!) 59 67  Resp: 10 14 14 16   Temp:   97.6 F (36.4 C)   TempSrc:   Oral   SpO2: 96% 92% 91% 95%  Weight:      Height:        Intake/Output from previous day: 04/25 0701 - 04/26 0700 In: 31.3 [I.V.:31.3] Out: -  Intake/Output this shift: Total I/O In: 493.8 [I.V.:493.8] Out: 1200 [Urine:1200]  Physical Exam: Awake alert, fully oriented to name, [facility] hospital, and April 2019.  Moving all 4 extremities well.  No drift of upper extremities.  CBC Recent Labs    11/24/17 0126  WBC 8.5  HGB 14.2  HCT 42.6  PLT 217   BMET Recent Labs    11/24/17 0126  NA 142  K 3.9  CL 105  CO2 25  GLUCOSE 114*  BUN 16  CREATININE 0.88  CALCIUM 9.5    Studies/Results: Ct Angio Head W Or Wo Contrast  Result Date: 11/24/2017 CLINICAL DATA:  Acute onset severe headache while doing dishes. Posterior neck pain. History of hypertension. EXAM: CT ANGIOGRAPHY HEAD AND NECK TECHNIQUE: Multidetector CT imaging of the head and neck was performed using the standard protocol during bolus administration of intravenous contrast. Multiplanar CT image reconstructions and MIPs were obtained to evaluate the vascular anatomy. Carotid stenosis measurements (when applicable) are obtained utilizing NASCET criteria, using the distal internal carotid  diameter as the denominator. CONTRAST:  188mL ISOVUE-370 IOPAMIDOL (ISOVUE-370) INJECTION 76% COMPARISON:  None. FINDINGS: CT HEAD FINDINGS BRAIN: No intraparenchymal hemorrhage, mass effect nor midline shift. Moderate ventriculomegaly, trace dependent density in the occipital horns. Patchy supratentorial white matter hypodensities within normal range for patient's age, though non-specific are most compatible with chronic small vessel ischemic disease. LEFT inferior basal ganglia perivascular space. No acute large vascular territory infarcts. No abnormal extra-axial fluid collections. Basal cisterns are patent. VASCULAR: Minimal calcific atherosclerosis of the carotid siphons. SKULL: No skull fracture. No significant scalp soft tissue swelling. SINUSES/ORBITS: The mastoid air-cells and included paranasal sinuses are well-aerated.The included ocular globes and orbital contents are non-suspicious. Status post bilateral ocular lens implants. OTHER: None. CTA NECK FINDINGS: AORTIC ARCH: Normal appearance of the thoracic arch, normal branch pattern. Mild calcific atherosclerosis aortic arch. The origins of the innominate, left Common carotid artery and subclavian artery are widely patent. RIGHT CAROTID SYSTEM: Common carotid artery is patent. Normal appearance of the carotid bifurcation without hemodynamically significant stenosis by NASCET criteria. Normal appearance of the internal carotid artery. LEFT CAROTID SYSTEM: Common carotid artery is patent. Trace calcific atherosclerosis carotid bifurcation without hemodynamically significant stenosis by NASCET criteria. Normal appearance of the internal carotid artery. VERTEBRAL ARTERIES:Left vertebral artery is dominant. Normal appearance of the vertebral arteries, widely patent. SKELETON: No acute osseous process though bone windows have not been submitted. Moderate C5-6 degenerative disc and moderate LEFT  C4-5 facet arthropathy. Moderate to severe LEFT C4-5 and C5-6  neural foraminal narrowing. OTHER NECK: Soft tissues of the neck are nonacute though, not tailored for evaluation. UPPER CHEST: RIGHT upper lobe ground-glass opacity measuring approximately 16 mm. CTA HEAD FINDINGS: ANTERIOR CIRCULATION: Patent cervical internal carotid arteries, petrous, cavernous and supra clinoid internal carotid arteries. 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 Patent anterior communicating artery. Patent anterior and middle cerebral arteries. No large vessel occlusion, significant stenosis, contrast extravasation. POSTERIOR CIRCULATION: Patent vertebral arteries, vertebrobasilar junction and basilar artery, as well as main branch vessels. Patent posterior cerebral arteries. No large vessel occlusion, significant stenosis, contrast extravasation or aneurysm. VENOUS SINUSES: Major dural venous sinuses are patent though not tailored for evaluation on this angiographic examination. ANATOMIC VARIANTS: Duplicated RIGHT PCA. DELAYED PHASE: No abnormal intracranial enhancement. MIP images reviewed. IMPRESSION: CT HEAD: 1. Trace dependent intraventricular blood products (redistributed subarachnoid hemorrhage) or purulent material. 2. Otherwise negative CT HEAD with and without contrast for age. CTA NECK: 1. No hemodynamically significant stenosis or acute vascular process in the neck. 2. RIGHT upper lobe ground-glass with and may be infectious or inflammatory. Recommend follow-up. 3. Moderate to severe LEFT C4-5 and C5-6 neural foraminal narrowing. CTA HEAD: 1. No emergent large vessel occlusion or flow limiting stenosis. 2. 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 Neuro-Interventional Radiology consultation is suggested to evaluate the appropriateness of potential treatment. Non-emergent evaluation can be arranged by calling [phone] during usual hours. Emergency evaluation can be requested by paging [phone]. Critical  Value/emergent results were called by telephone at the time of interpretation on 11/24/2017 at 2:55 am to Dr. Delora Fuel , who verbally acknowledged these results. Aortic Atherosclerosis (ICD10-I70.0). Electronically Signed   By: Elon Alas M.D.   On: 11/24/2017 02:56   Ct Angio Neck W And/or Wo Contrast  Result Date: 11/24/2017 CLINICAL DATA:  Acute onset severe headache while doing dishes. Posterior neck pain. History of hypertension. EXAM: CT ANGIOGRAPHY HEAD AND NECK TECHNIQUE: Multidetector CT imaging of the head and neck was performed using the standard protocol during bolus administration of intravenous contrast. Multiplanar CT image reconstructions and MIPs were obtained to evaluate the vascular anatomy. Carotid stenosis measurements (when applicable) are obtained utilizing NASCET criteria, using the distal internal carotid diameter as the denominator. CONTRAST:  168mL ISOVUE-370 IOPAMIDOL (ISOVUE-370) INJECTION 76% COMPARISON:  None. FINDINGS: CT HEAD FINDINGS BRAIN: No intraparenchymal hemorrhage, mass effect nor midline shift. Moderate ventriculomegaly, trace dependent density in the occipital horns. Patchy supratentorial white matter hypodensities within normal range for patient's age, though non-specific are most compatible with chronic small vessel ischemic disease. LEFT inferior basal ganglia perivascular space. No acute large vascular territory infarcts. No abnormal extra-axial fluid collections. Basal cisterns are patent. VASCULAR: Minimal calcific atherosclerosis of the carotid siphons. SKULL: No skull fracture. No significant scalp soft tissue swelling. SINUSES/ORBITS: The mastoid air-cells and included paranasal sinuses are well-aerated.The included ocular globes and orbital contents are non-suspicious. Status post bilateral ocular lens implants. OTHER: None. CTA NECK FINDINGS: AORTIC ARCH: Normal appearance of the thoracic arch, normal branch pattern. Mild calcific atherosclerosis  aortic arch. The origins of the innominate, left Common carotid artery and subclavian artery are widely patent. RIGHT CAROTID SYSTEM: Common carotid artery is patent. Normal appearance of the carotid bifurcation without hemodynamically significant stenosis by NASCET criteria. Normal appearance of the internal carotid artery. LEFT CAROTID SYSTEM: Common carotid artery is patent. Trace calcific atherosclerosis carotid bifurcation without hemodynamically  significant stenosis by NASCET criteria. Normal appearance of the internal carotid artery. VERTEBRAL ARTERIES:Left vertebral artery is dominant. Normal appearance of the vertebral arteries, widely patent. SKELETON: No acute osseous process though bone windows have not been submitted. Moderate C5-6 degenerative disc and moderate LEFT C4-5 facet arthropathy. Moderate to severe LEFT C4-5 and C5-6 neural foraminal narrowing. OTHER NECK: Soft tissues of the neck are nonacute though, not tailored for evaluation. UPPER CHEST: RIGHT upper lobe ground-glass opacity measuring approximately 16 mm. CTA HEAD FINDINGS: ANTERIOR CIRCULATION: Patent cervical internal carotid arteries, petrous, cavernous and supra clinoid internal carotid arteries. 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 Patent anterior communicating artery. Patent anterior and middle cerebral arteries. No large vessel occlusion, significant stenosis, contrast extravasation. POSTERIOR CIRCULATION: Patent vertebral arteries, vertebrobasilar junction and basilar artery, as well as main branch vessels. Patent posterior cerebral arteries. No large vessel occlusion, significant stenosis, contrast extravasation or aneurysm. VENOUS SINUSES: Major dural venous sinuses are patent though not tailored for evaluation on this angiographic examination. ANATOMIC VARIANTS: Duplicated RIGHT PCA. DELAYED PHASE: No abnormal intracranial enhancement. MIP images reviewed. IMPRESSION: CT HEAD: 1. Trace dependent  intraventricular blood products (redistributed subarachnoid hemorrhage) or purulent material. 2. Otherwise negative CT HEAD with and without contrast for age. CTA NECK: 1. No hemodynamically significant stenosis or acute vascular process in the neck. 2. RIGHT upper lobe ground-glass with and may be infectious or inflammatory. Recommend follow-up. 3. Moderate to severe LEFT C4-5 and C5-6 neural foraminal narrowing. CTA HEAD: 1. No emergent large vessel occlusion or flow limiting stenosis. 2. 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 Neuro-Interventional Radiology consultation is suggested to evaluate the appropriateness of potential treatment. Non-emergent evaluation can be arranged by calling [phone] during usual hours. Emergency evaluation can be requested by paging [phone]. Critical Value/emergent results were called by telephone at the time of interpretation on 11/24/2017 at 2:55 am to Dr. Delora Fuel , who verbally acknowledged these results. Aortic Atherosclerosis (ICD10-I70.0). Electronically Signed   By: Elon Alas M.D.   On: 11/24/2017 02:56    Assessment/Plan: Spoke with the patient and her daughter Lovey Newcomer about the arteriogram results.  We reviewed the CT images from last night as well as the arteriogram images from today.  I suspect the small amount of intraventricular hemorrhage was venous in nature.  I have explained that we will continue to monitor her, and hopefully will be able to discharge her to home soon, when vital signs and neurologic function are stable.  I have asked the patient to return for follow-up visit with me in the office in about a month.  For the first 2 weeks I have advised her to avoid strenuous activities including bending and lifting.  I have asked her to not to be loading and unloading the washer dryer dishwasher etc. similarly I do not want her doing housework around her home.  After 2 weeks she can gradually resume  her usual household activities.  She also works out regularly at [employer], and I have asked her to walk in her neighborhood with family and friends for now, but to hold off on returning to the gym for at least 2 weeks.  Hosie Spangle, MD 11/24/2017, 5:44 PM

## 2017-11-25 LAB — BASIC METABOLIC PANEL
Anion gap: 9 (ref 5–15)
BUN: 13 mg/dL (ref 6–20)
CO2: 22 mmol/L (ref 22–32)
Calcium: 8.7 mg/dL — ABNORMAL LOW (ref 8.9–10.3)
Chloride: 108 mmol/L (ref 101–111)
Creatinine, Ser: 0.88 mg/dL (ref 0.44–1.00)
GFR calc Af Amer: >60 mL/min (ref >60)
GFR calc non Af Amer: 58 mL/min — ABNORMAL LOW (ref >60)
Glucose, Bld: 189 mg/dL — ABNORMAL HIGH (ref 65–99)
Potassium: 3.8 mmol/L (ref 3.5–5.1)
Sodium: 139 mmol/L (ref 135–145)

## 2017-11-25 MED ORDER — HYDROCODONE-ACETAMINOPHEN 5-325 MG PO TABS: 1.0000 | ORAL_TABLET | Freq: Four times a day (QID) | ORAL | 0 refills | Status: DC | PRN

## 2017-11-25 NOTE — Progress Notes (Signed)
Patient remains stable, vitals are WNL. Good appetite this morning.She has had no return of symptoms. Daughter is at the bedside and RN has reviewed all discharge instructions and answered all questions to their satisfaction. All personal items are accounted for. Patient is being discharged to home with her daughter.

## 2017-11-25 NOTE — Discharge Summary (Signed)
Physician Discharge Summary  Patient ID: CAIDYNCE MUZYKA MRN: [id number] DOB/AGE: Feb 12, 1929 84 y.o.  Admit date: 11/23/2017 Discharge date: 11/25/2017  Admission Diagnoses: Intraventricular hemorrhage, posterior communicating artery infundibulum  Discharge Diagnoses:  Active Problems:   Posterior communicating artery aneurysm   Intraventricular hemorrhage [facility])   Discharged Condition: good  Hospital Course: Dr. Sherwood Gambler admitted the patient on 11/23/2017 with a diagnosis of a bilateral intraventricular hemorrhage.  The patient was worked up with a CT angiogram and an arteriogram which demonstrated a small posterior communicating artery infundibulum but no evidence of aneurysms or AVMs.  Her intraventricular hemorrhage was attributed to a venous bleed.  The patient's hospital course was otherwise unremarkable.  On 11/25/2017 the patient, and her daughter, requested discharge to home.  She was given written and oral discharge instructions.  All their questions were answered.  Consults: None Significant Diagnostic Studies: Head CT, head CT angiogram, cerebral arteriogram Treatments: Observation Discharge Exam: Blood pressure (!) 150/66, pulse 73, temperature 98.6 F (37 C), temperature source Axillary, resp. rate (!) 21, height 5\' 2"  (1.575 m), weight 83.9 kg (185 lb), SpO2 100 %. The patient is alert and pleasant.  She looks well.  She is oriented x3.  She is moving all 4 extremities well.  Disposition: Home  Discharge Instructions    Call MD for:  difficulty breathing, headache or visual disturbances   Complete by:  As directed    Call MD for:  extreme fatigue   Complete by:  As directed    Call MD for:  hives   Complete by:  As directed    Call MD for:  persistant dizziness or light-headedness   Complete by:  As directed    Call MD for:  persistant nausea and vomiting   Complete by:  As directed    Call MD for:  redness, tenderness, or signs of infection (pain, swelling,  redness, odor or green/yellow discharge around incision site)   Complete by:  As directed    Call MD for:  severe uncontrolled pain   Complete by:  As directed    Call MD for:  temperature >100.4   Complete by:  As directed    Diet - low sodium heart healthy   Complete by:  As directed    Discharge instructions   Complete by:  As directed    Call [phone] for a followup appointment. Take a stool softener while you are using pain medications.   Driving Restrictions   Complete by:  As directed    Do not drive for 2 weeks.   Increase activity slowly   Complete by:  As directed    Lifting restrictions   Complete by:  As directed    Do not lift more than 5 pounds. No excessive bending or twisting.   May shower / Bathe   Complete by:  As directed    No wound care   Complete by:  As directed      Allergies as of 11/25/2017      Reactions   Amoxicillin Hives, Itching, Rash   Has patient had a PCN reaction causing immediate rash, facial/tongue/throat swelling, SOB or lightheadedness with hypotension: Yes Has patient had a PCN reaction causing severe rash involving mucus membranes or skin necrosis: No Has patient had a PCN reaction that required hospitalization: No Has patient had a PCN reaction occurring within the last 10 years: No If all of the above answers are "NO", then may proceed with Cephalosporin use.   Cephalexin  Hives, Itching, Rash   Doxycycline Rash      Medication List    STOP taking these medications   FISH OIL 300 MG Caps   triamcinolone 0.025 % ointment Commonly known as:  KENALOG     TAKE these medications   atenolol 25 MG tablet Commonly known as:  TENORMIN Take  1 tablet by mouth daily.   beclomethasone 40 MCG/ACT inhaler Commonly known as:  QVAR REDIHALER Inhale 2 puffs into the lungs 2 (two) times daily.   CALCIUM 600 1500 (600 Ca) MG Tabs tablet Generic drug:  calcium carbonate Take by mouth daily. As directed   cetirizine 10 MG  tablet Commonly known as:  ZYRTEC Take 10 mg by mouth daily.   hydrochlorothiazide 12.5 MG capsule Commonly known as:  MICROZIDE TAKE 1 CAPSULE BY MOUTH EVERY DAY   HYDROcodone-acetaminophen 5-325 MG tablet Commonly known as:  NORCO/VICODIN Take 1 tablet by mouth every 6 (six) hours as needed for moderate pain.   multivitamin tablet Take 1 tablet by mouth daily.   Vitamin D 1000 units capsule Take 1,000 Units by mouth daily.        Signed: Ophelia Charter 11/25/2017, 8:29 AM

## 2017-11-25 NOTE — Plan of Care (Signed)
Patient is feeling well, vitals are stable and there has been no return of symptoms that brought her to the hospital. Patient is being discharged to home with her daughter.

## 2017-11-27 ENCOUNTER — Encounter (HOSPITAL_COMMUNITY): Payer: Self-pay | Admitting: Interventional Radiology

## 2017-11-27 ENCOUNTER — Telehealth: Payer: Self-pay | Admitting: Family Medicine

## 2017-11-27 ENCOUNTER — Ambulatory Visit (INDEPENDENT_AMBULATORY_CARE_PROVIDER_SITE_OTHER): Payer: Medicare Other | Admitting: Family Medicine

## 2017-11-27 VITALS — BP 128/60 | HR 58 | Temp 98.0°F | Wt 183.0 lb

## 2017-11-27 DIAGNOSIS — I671 Cerebral aneurysm, nonruptured: Secondary | ICD-10-CM

## 2017-11-27 DIAGNOSIS — I10 Essential (primary) hypertension: Secondary | ICD-10-CM | POA: Diagnosis not present

## 2017-11-27 DIAGNOSIS — I615 Nontraumatic intracerebral hemorrhage, intraventricular: Secondary | ICD-10-CM | POA: Diagnosis not present

## 2017-11-27 NOTE — Progress Notes (Signed)
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 Her daughter was there and they called 911. They took her to the hospital. Brain scan showed a slight bleed. BP at that time is 180/100. Her blood pressures prior that have been normal. She takes hydrochlorothiazide 12.5 mg daily along with atenolol 12.5 mg daily. We recently increased her Tenormin 25 mg daily because she was having symptomatic PVCs.  In the hospital she had no complications. She was seen by Dr. Sherwood Gambler. She was discharged after 48 hours to be followed as an outpatient  They've held her beta blocker because her blood pressure has been normal. BP today 120/60. Pulse 60 and regular. She did gain 7 pounds in the hospital because she was off the diuretic. Her daughter restarted her diuretic today.  BP 128/60 (BP Location: Left Arm, Patient Position: Sitting, Cuff Size: Large)   Pulse (!) 58   Temp 98 F (36.7 C) (Oral)   Wt 183 lb (83 kg)   BMI 33.47 kg/m  Well-developed well-nourished female no acute distress vital signs stable she's afebrile neurologic exam normal. #1 CNS bleed #2 history of hypertension

## 2017-11-27 NOTE — Patient Instructions (Signed)
Continue limited activities per Dr. Sherwood Gambler  Return to see me May 13  Continue the daily diuretic  Hold the atenolol............ if however your blood pressure goes above 140/90...Marland KitchenMarland KitchenMarland Kitchen restart the atenolol at 12.5 mg daily

## 2017-11-27 NOTE — Telephone Encounter (Signed)
Transition Care Management Follow-up Telephone Call Patient ID: Taylor Blankenship MRN: [id number] DOB/AGE: 1929-04-19 84 y.o.  Admit date: 11/23/2017 Discharge date: 11/25/2017  Admission Diagnoses: Intraventricular hemorrhage, posterior communicating artery infundibulum  Discharge Diagnoses:  Active Problems:   Posterior communicating artery aneurysm   Intraventricular hemorrhage [facility])   Discharged Condition: good  Hospital Course: Dr. Sherwood Gambler admitted the patient on 11/23/2017 with a diagnosis of a bilateral intraventricular hemorrhage.  The patient was worked up with a CT angiogram and an arteriogram which demonstrated a small posterior communicating artery infundibulum but no evidence of aneurysms or AVMs.  Her intraventricular hemorrhage was attributed to a venous bleed.  The patient's hospital course was otherwise unremarkable.  On 11/25/2017 the patient, and her daughter, requested discharge to home.  She was given written and oral discharge instructions.  All their questions were answered.    How have you been since you were released from the hospital? "Taylor Blankenship (daughter) says patient doing better"   Do you understand why you were in the hospital? yes   Do you understand the discharge instructions? yes   Where were you discharged to? Home   Items Reviewed:  Medications reviewed: yes  Allergies reviewed: yes  Dietary changes reviewed: yes  Referrals reviewed: yes   Functional Questionnaire:   Activities of Daily Living (ADLs):   She states they are independent in the following: Daughter is helping with ADL's  States they require assistance with the following: ambulation, bathing and hygiene, feeding, continence, grooming, toileting and dressing   Any transportation issues/concerns?: no   Any patient concerns? no   Confirmed importance and date/time of follow-up visits scheduled yes  Provider Appointment booked with Dr, Sherren Mocha on 11/27/2017 at 10:00  am, this was made on my chart.  Confirmed with patient if condition begins to worsen call PCP or go to the ER.  Patient was given the office number and encouraged to call back with question or concerns.  : yes

## 2017-11-29 ENCOUNTER — Encounter (HOSPITAL_COMMUNITY): Payer: Self-pay

## 2017-11-29 ENCOUNTER — Other Ambulatory Visit: Payer: Self-pay | Admitting: Neurosurgery

## 2017-11-29 DIAGNOSIS — I615 Nontraumatic intracerebral hemorrhage, intraventricular: Secondary | ICD-10-CM

## 2017-12-04 ENCOUNTER — Ambulatory Visit: Payer: Self-pay

## 2017-12-11 ENCOUNTER — Ambulatory Visit
Admission: RE | Admit: 2017-12-11 | Discharge: 2017-12-11 | Disposition: A | Discharge status: Home / Self Care | Payer: Medicare Other | Source: Ambulatory Visit | Attending: Neurosurgery | Admitting: Neurosurgery

## 2017-12-11 ENCOUNTER — Ambulatory Visit: Payer: Self-pay | Admitting: Family Medicine

## 2017-12-11 DIAGNOSIS — I615 Nontraumatic intracerebral hemorrhage, intraventricular: Secondary | ICD-10-CM

## 2017-12-12 ENCOUNTER — Encounter: Payer: Self-pay | Admitting: Family Medicine

## 2017-12-12 ENCOUNTER — Ambulatory Visit (INDEPENDENT_AMBULATORY_CARE_PROVIDER_SITE_OTHER): Payer: Medicare Other

## 2017-12-12 ENCOUNTER — Ambulatory Visit: Payer: Medicare Other | Admitting: Family Medicine

## 2017-12-12 ENCOUNTER — Ambulatory Visit (INDEPENDENT_AMBULATORY_CARE_PROVIDER_SITE_OTHER): Payer: Medicare Other | Admitting: Family Medicine

## 2017-12-12 VITALS — BP 130/82 | HR 70 | Temp 98.3°F | Wt 182.0 lb

## 2017-12-12 DIAGNOSIS — I615 Nontraumatic intracerebral hemorrhage, intraventricular: Secondary | ICD-10-CM

## 2017-12-12 DIAGNOSIS — R9389 Abnormal findings on diagnostic imaging of other specified body structures: Secondary | ICD-10-CM | POA: Insufficient documentation

## 2017-12-12 NOTE — Patient Instructions (Signed)
Check your blood pressure daily at home  If you see a consistent elevation please call me  Chest x-ray today............ I will call you if there is anything abnormal.

## 2017-12-12 NOTE — Progress Notes (Signed)
Taylor Blankenship is a 84 year old married female nonsmoker who comes in today accompanied by her daughter for follow-up having been hospitalized 5 weeks ago with a subarachnoid hemorrhage  She recently saw the neurosurgeons. They've restricted activities for another 3 weeks. She is walking but no heavy lifting etc. etc.  When she left the hospital she was on a beta blocker because of her pressure elevation. However when she got home her blood pressure dropped. We been tapering off the beta blocker in a week ago stopped it completely because her pressure was 110 120 range.  She comes in today for follow-up. BP today was normal 120/80. I therefore suspect her hypertension hospital was secondary to the subarachnoid hemorrhage and not primary hypertension. However work on a monitor blood pressure daily at home  During hospitalization the skin of her head and neck showed a groundglass abnormality of her left upper lobe of her lung. Radiology recommended a follow-up chest x-ray. She'll be sent for x-ray today  Scan also shows spinal stenosis moderate to severe 4556 in the cervical spine. However she is asymptomatic and were not can address that issue at this juncture.  BP 130/82 (BP Location: Left Arm, Patient Position: Sitting, Cuff Size: Normal)   Pulse 70   Temp 98.3 F (36.8 C) (Oral)   Wt 182 lb (82.6 kg)   BMI 33.29 kg/m  Well-developed well-nourished female no acute distress vital signs stable she's afebrile BP 130/82  #1 status post subarachnoid hemorrhage recovered without sequelae  #2 hypertension secondary to subarachnoid hemorrhage,,,,,,,,,,,,,, BP normal off medication

## 2017-12-13 ENCOUNTER — Other Ambulatory Visit: Payer: Self-pay | Admitting: Family Medicine

## 2017-12-13 DIAGNOSIS — R911 Solitary pulmonary nodule: Secondary | ICD-10-CM

## 2017-12-20 ENCOUNTER — Ambulatory Visit
Admission: RE | Admit: 2017-12-20 | Discharge: 2017-12-20 | Disposition: A | Discharge status: Home / Self Care | Payer: Medicare Other | Source: Ambulatory Visit | Attending: Family Medicine | Admitting: Family Medicine

## 2017-12-20 DIAGNOSIS — R911 Solitary pulmonary nodule: Secondary | ICD-10-CM

## 2017-12-26 ENCOUNTER — Other Ambulatory Visit: Payer: Self-pay | Admitting: Family Medicine

## 2017-12-26 DIAGNOSIS — R911 Solitary pulmonary nodule: Secondary | ICD-10-CM

## 2018-01-04 ENCOUNTER — Encounter: Payer: Self-pay | Admitting: Family Medicine

## 2018-01-04 DIAGNOSIS — R911 Solitary pulmonary nodule: Secondary | ICD-10-CM

## 2018-01-10 ENCOUNTER — Ambulatory Visit
Admission: RE | Admit: 2018-01-10 | Discharge: 2018-01-10 | Disposition: A | Discharge status: Home / Self Care | Payer: Medicare Other | Source: Ambulatory Visit | Attending: Family Medicine | Admitting: Family Medicine

## 2018-01-10 DIAGNOSIS — Z1231 Encounter for screening mammogram for malignant neoplasm of breast: Secondary | ICD-10-CM

## 2018-02-16 ENCOUNTER — Ambulatory Visit (INDEPENDENT_AMBULATORY_CARE_PROVIDER_SITE_OTHER)
Admission: RE | Admit: 2018-02-16 | Discharge: 2018-02-16 | Disposition: A | Discharge status: Home / Self Care | Payer: Medicare Other | Source: Ambulatory Visit | Attending: Internal Medicine | Admitting: Internal Medicine

## 2018-02-16 ENCOUNTER — Encounter: Payer: Self-pay | Admitting: Internal Medicine

## 2018-02-16 ENCOUNTER — Ambulatory Visit (INDEPENDENT_AMBULATORY_CARE_PROVIDER_SITE_OTHER): Payer: Medicare Other | Admitting: Internal Medicine

## 2018-02-16 ENCOUNTER — Other Ambulatory Visit (INDEPENDENT_AMBULATORY_CARE_PROVIDER_SITE_OTHER): Payer: Medicare Other

## 2018-02-16 VITALS — BP 150/76 | HR 80 | Ht 63.0 in | Wt 181.4 lb

## 2018-02-16 DIAGNOSIS — J4520 Mild intermittent asthma, uncomplicated: Secondary | ICD-10-CM | POA: Diagnosis not present

## 2018-02-16 DIAGNOSIS — R9389 Abnormal findings on diagnostic imaging of other specified body structures: Secondary | ICD-10-CM | POA: Diagnosis not present

## 2018-02-16 DIAGNOSIS — R911 Solitary pulmonary nodule: Secondary | ICD-10-CM

## 2018-02-16 LAB — CBC WITH DIFFERENTIAL/PLATELET
Basophils Absolute: 0.1 10*3/uL (ref 0.0–0.1)
Basophils Relative: 1.7 % (ref 0.0–3.0)
Eosinophils Absolute: 0.3 10*3/uL (ref 0.0–0.7)
Eosinophils Relative: 5.5 % — ABNORMAL HIGH (ref 0.0–5.0)
HCT: 41 % (ref 36.0–46.0)
Hemoglobin: 13.7 g/dL (ref 12.0–15.0)
Lymphocytes Relative: 32.7 % (ref 12.0–46.0)
Lymphs Abs: 1.8 10*3/uL (ref 0.7–4.0)
MCHC: 33.5 g/dL (ref 30.0–36.0)
MCV: 92.6 fl (ref 78.0–100.0)
Monocytes Absolute: 0.6 10*3/uL (ref 0.1–1.0)
Monocytes Relative: 11.2 % (ref 3.0–12.0)
Neutro Abs: 2.7 10*3/uL (ref 1.4–7.7)
Neutrophils Relative %: 48.9 % (ref 43.0–77.0)
Platelets: 211 10*3/uL (ref 150.0–400.0)
RBC: 4.42 Mil/uL (ref 3.87–5.11)
RDW: 13.3 % (ref 11.5–15.5)
WBC: 5.6 10*3/uL (ref 4.0–10.5)

## 2018-02-16 LAB — SEDIMENTATION RATE: Sed Rate: 14 mm/hr (ref 0–30)

## 2018-02-16 NOTE — Patient Instructions (Addendum)
Please remember to go to the lab and x-ray department downstairs in the basement  for your tests - we will call you with the results when they are available.  Add:  CT chest reminder for  06/23/18

## 2018-02-16 NOTE — Progress Notes (Signed)
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 Pulmonary Consult    Referred by Dr. Sherren Mocha for eval of incidental pulmonary nodule. She states she has had a cough for several years- occ prod with min clear sputum.   in her late 87's developed seasonal cough/ wheeze never completely better even on prednisone x several courses so placed on qvar which seem to  Help when uses it but is not consistent.  Used zyrtec for rashes not nasal symptoms  Rides bike at gym x 30 min  but limited from walking treadmill by R knee   No obvious day to day or daytime variability or assoc excess/ purulent sputum or mucus plugs or hemoptysis or cp or chest tightness, subjective wheeze or overt sinus or hb symptoms.   Sleeps ok flat  without nocturnal  or early am exacerbation  of respiratory  c/o's or need for noct saba. Also denies any obvious fluctuation of symptoms with weather or environmental changes or other aggravating or alleviating factors except as outlined above   No unusual exposure hx or h/o childhood pna/ asthma or knowledge of premature birth.  Current Allergies, Complete Past Medical History, Past Surgical History, Family History, and Social History were reviewed in [employer] record.  ROS  The following are not active complaints unless bolded Hoarseness, sore throat, dysphagia, dental problems, itching, sneezing,  nasal congestion or discharge of excess mucus or purulent secretions, ear ache,   fever, chills, sweats, unintended wt loss or wt gain, classically pleuritic or exertional cp,  orthopnea pnd or arm/hand swelling  or leg  swelling, presyncope, palpitations, abdominal pain, anorexia, nausea, vomiting, diarrhea  or change in bowel habits or change in bladder habits, change in stools or change in urine, dysuria, hematuria,  rash, arthralgias, visual complaints, headache, numbness, weakness or ataxia or problems with walking or coordination,  change in mood or  memory.             Past Medical History:  Diagnosis Date  . Actinic keratosis   . ASTHMA   . Dermatophytosis of nail   . DJD (degenerative joint disease)   . History of shingles   . HYPERTENSION   . OSTEOPOROSIS   . PALPITATIONS, RECURRENT     Outpatient Medications Prior to Visit  Medication Sig Dispense Refill  . Calcium Carbonate (CALCIUM 600) 1500 MG TABS Take by mouth daily. As directed     . Cholecalciferol (VITAMIN D) 1000 UNITS capsule Take 1,000 Units by mouth daily.      . hydrochlorothiazide (MICROZIDE) 12.5 MG capsule TAKE 1 CAPSULE BY MOUTH EVERY DAY 199 capsule 4  . Multiple Vitamin (MULTIVITAMIN) tablet Take 1 tablet by mouth daily.      Marland Kitchen atenolol (TENORMIN) 25 MG tablet Take  1 tablet by mouth daily. (Patient not taking: Reported on 12/12/2017) 100 tablet 3  . beclomethasone (QVAR REDIHALER) 40 MCG/ACT inhaler Inhale 2 puffs into the lungs 2 (two) times daily. 2 Inhaler 4  . cetirizine (ZYRTEC) 10 MG tablet Take 10 mg by mouth  daily.    Marland Kitchen HYDROcodone-acetaminophen (NORCO/VICODIN) 5-325 MG tablet Take 1 tablet by mouth every 6 (six) hours as needed for moderate pain. 20 tablet 0   No facility-administered medications prior to visit.              Objective:     BP (!) 150/76 (BP Location: Left Arm, Cuff Size: Normal)   Pulse 80   Ht 5\' 3"  (1.6 m)   Wt 181 lb 6.4 oz (82.3 kg)   SpO2 95%   BMI 32.13 kg/m   SpO2: 95 %  RA      HEENT: nl dentition, turbinates bilaterally, and oropharynx.  L ear canal obst by wax/ R  nl    NECK :  without JVD/Nodes/TM/ nl carotid upstrokes bilaterally   LUNGS: no acc muscle use,   Nl contour chest which is clear to A and P bilaterally with  Cough late  on exp maneuvers   CV:  RRR  no s3 or murmur or increase in P2, and no edema   ABD:  soft and nontender with nl inspiratory excursion in the supine position. No bruits or organomegaly appreciated, bowel sounds nl  MS:  Nl gait/ ext warm without deformities, calf tenderness, cyanosis or clubbing No obvious joint restrictions   SKIN: warm and dry without lesions    NEURO:  alert, approp, nl sensorium with  no motor or cerebellar deficits apparent.       I personally reviewed images and agree with radiology impression as follows:   Chest CT w/o contreast 12/21/17 14 mm ground-glass nodule in the right upper lobe. Cannot exclude low grade primary adenocarcinoma. Initial follow-up with CT at 6-12 months is recommended to confirm persistence.   CXR PA and Lateral:   02/16/2018 :    I personally reviewed images and agree with radiology impression as follows:    Negative for acute cardiopulmonary disease. My impression: the original area on plain film  that prompted the CT chest is not the same as the incidental GG change which is in the apical or pos segment on CT scan and can't be seen on the plain film at all    Labs ordered/ reviewed:      Chemistry      Component Value Date/Time   NA 139 11/25/2017 0305   K 3.8 11/25/2017 0305   CL 108 11/25/2017 0305   CO2 22 11/25/2017 0305   BUN 13 11/25/2017 0305   CREATININE 0.88 11/25/2017 0305      Component Value Date/Time   CALCIUM 8.7 (L) 11/25/2017 0305   ALKPHOS 67 12/08/2015 0859   AST 32 12/08/2015 0859   ALT 27 12/08/2015 0859   BILITOT 1.1 12/08/2015 0859        Lab Results  Component Value Date   WBC 5.6 02/16/2018   HGB 13.7 02/16/2018   HCT 41.0 02/16/2018   MCV 92.6 02/16/2018   PLT 211.0 02/16/2018       EOS                                                              0.3  02/16/2018   No results  found for: DDIMER        Lab Results  Component Value Date   ESRSEDRATE 14 02/16/2018       Labs ordered 02/16/2018  [facility] TB     Assessment   No problem-specific Assessment & Plan notes found for this encounter.     Christinia Gully, MD 02/16/2018

## 2018-02-17 ENCOUNTER — Encounter: Payer: Self-pay | Admitting: Internal Medicine

## 2018-02-17 DIAGNOSIS — R911 Solitary pulmonary nodule: Secondary | ICD-10-CM | POA: Insufficient documentation

## 2018-02-17 NOTE — Assessment & Plan Note (Addendum)
Spirometry 02/16/2018  FEV1 1.66 (104%)  Ratio 76 s curvature   Allergy profile 02/16/2018 >  Eos 0.3 /  IgE pending    Variably compliant with ICS / reviewed with pt need to use more consistently then if not well controlled consider step up vs re-eval with MCT to be sure symptoms are really asthmatic in nature   Advised and will f/u here prn for persistent symptoms on regular rx.      Total time devoted to counseling  > 50 % of initial 60 min office visit:  review case with pt/attentive daughter Taylor Blankenship  discussion of options/alternatives/ personally creating written customized instructions  in presence of pt  then going over those specific  Instructions directly with the pt including how to use all of the meds but in particular covering each new medication in detail and the difference between the maintenance= "automatic" meds and the prns using an action plan format for the latter (If this problem/symptom => do that organization reading Left to right).  Please see AVS from this visit for a full list of these instructions which I personally wrote for this pt and  are unique to this visit.

## 2018-02-17 NOTE — Assessment & Plan Note (Addendum)
See CT chest 12/21/17  = 14 mm GG changes apicoposterior RUL > in computer for recall 06/23/18 by [facility] guidlelines   This is separate location  from cxr findings and since not solid high likelihood of false neg in this pt with minimal smoking hx stopped almost 50 years ago so is relatively low but not "no risk" setting   CT results reviewed with pt/daughter >>> Too gg  for PET as abivem  not suspicious enough for excisional bx > really only option for now is follow the Fleischner society guidelines as rec by radiology = 6-12 months with err on conservative sign given prior smoking status.  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

## 2018-02-17 NOTE — Assessment & Plan Note (Signed)
It turns out the vague changes on cxr in RUL are not vz on CT chest and have resolved as of 02/16/2018 but there is another area viz or CT in RUL apical or post RUL that will need f/u - see spn

## 2018-02-19 ENCOUNTER — Telehealth: Payer: Self-pay | Admitting: Internal Medicine

## 2018-02-19 LAB — QUANTIFERON-TB GOLD PLUS
Mitogen-NIL: >10 IU/mL
NIL: 0.04 IU/mL
QuantiFERON-TB Gold Plus: NEGATIVE
TB1-NIL: 0 IU/mL
TB2-NIL: 0 IU/mL

## 2018-02-19 LAB — RESPIRATORY ALLERGY PROFILE REGION II ~~LOC~~

## 2018-02-19 LAB — INTERPRETATION:

## 2018-02-19 NOTE — Progress Notes (Signed)
Spoke with daughter and notified of results

## 2018-02-19 NOTE — Telephone Encounter (Signed)
Reviewed images from neck ct 11/24/17 with Dr Ardeen Garland :  GG changes Minimally smaller  On the later ct chest from 12/21/17  , f/u 6 m is approp > reviewed with [name] her daughter as well

## 2018-02-19 NOTE — Telephone Encounter (Signed)
Spoke with pt's daughter, [name] about lab results  She states that the pt spoke with Dr Melvyn Novas this morning and now she is confused  She is under the impression that there was something suspicious on her cxr  Daughter wants to know if this is correct or not Please advise thanks

## 2018-04-23 ENCOUNTER — Encounter: Payer: Self-pay | Admitting: Family Medicine

## 2018-04-23 ENCOUNTER — Ambulatory Visit (INDEPENDENT_AMBULATORY_CARE_PROVIDER_SITE_OTHER): Payer: Medicare Other | Admitting: Family Medicine

## 2018-04-23 VITALS — BP 140/82 | HR 67 | Temp 97.7°F | Ht 62.0 in | Wt 182.2 lb

## 2018-04-23 DIAGNOSIS — Z23 Encounter for immunization: Secondary | ICD-10-CM

## 2018-04-23 DIAGNOSIS — R9389 Abnormal findings on diagnostic imaging of other specified body structures: Secondary | ICD-10-CM

## 2018-04-23 DIAGNOSIS — H9113 Presbycusis, bilateral: Secondary | ICD-10-CM | POA: Diagnosis not present

## 2018-04-23 DIAGNOSIS — I872 Venous insufficiency (chronic) (peripheral): Secondary | ICD-10-CM | POA: Diagnosis not present

## 2018-04-23 LAB — BASIC METABOLIC PANEL
BUN: 16 mg/dL (ref 6–23)
CO2: 30 mEq/L (ref 19–32)
Calcium: 10.3 mg/dL (ref 8.4–10.5)
Chloride: 101 mEq/L (ref 96–112)
Creatinine, Ser: 0.87 mg/dL (ref 0.40–1.20)
GFR: 65.43 mL/min (ref >60.00)
Glucose, Bld: 90 mg/dL (ref 70–99)
Potassium: 4 mEq/L (ref 3.5–5.1)
Sodium: 139 mEq/L (ref 135–145)

## 2018-04-23 LAB — POCT URINALYSIS DIPSTICK
Bilirubin, UA: NEGATIVE
Blood, UA: NEGATIVE
Glucose, UA: NEGATIVE
Ketones, UA: NEGATIVE
Leukocytes, UA: NEGATIVE
Nitrite, UA: NEGATIVE
Protein, UA: NEGATIVE
Spec Grav, UA: 1.015 (ref 1.010–1.025)
Urobilinogen, UA: 0.2 E.U./dL
pH, UA: 5.5 (ref 5.0–8.0)

## 2018-04-23 LAB — TSH: TSH: 3.51 u[IU]/mL (ref 0.35–4.50)

## 2018-04-23 MED ORDER — BECLOMETHASONE DIPROP HFA 40 MCG/ACT IN AERB: 2.0000 | INHALATION_SPRAY | Freq: Two times a day (BID) | RESPIRATORY_TRACT | 2 refills | Status: DC

## 2018-04-23 MED ORDER — HYDROCHLOROTHIAZIDE 12.5 MG PO CAPS: ORAL_CAPSULE | ORAL | 4 refills | Status: AC

## 2018-04-23 NOTE — Progress Notes (Signed)
Taylor Blankenship is a delightful 84 year old married female,,,,,, non-smoker,,,,,,, retired Marine scientist,,,,,,, who comes in today accompanied by her daughter [name] for annual evaluation of the following problems  She has a history of mild hypertension for which she takes hydrochlorothiazide 12.5 mg daily.  BP 140/82  This past year she had a CNS bleed that was unrelated to hypertension.  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  One from her shoulder right the other from her right triceps area.  Vaccinations up-to-date seasonal flu shot given decay she declines a shingles vaccine  She gets routine eye care, dental care, mammography 12/2017 was normal.  Last colonoscopy was 7 years ago at age 81.  It was normal.  All her colonoscopies been normal.  14 point review of system reviewed otherwise negative except for persistence of the lower extremity edema.  We discussed various options including full leg stockings which she has but does not wear.  She has a history of a pulmonary nodule.  It has been followed in pulmonary.  She is due to get a follow-up CT scan via pulmonary  Cognitive function normal she walks daily home health safety reviewed no issues identified, no guns in the house, she does have a healthcare power of attorney and living well  EKG was done in the hospital therefore not repeated.  He previous EKGs have been normal  BP 140/82 (BP Location: Right Arm, Patient Position: Sitting, Cuff Size: Large)   Pulse 67   Temp 97.7 F (36.5 C) (Oral)   Ht 5\' 2"  (1.575 m)   Wt 182 lb 3.2 oz (82.6 kg)   SpO2 98%   BMI 33.32 kg/m  Well-developed well-nourished female no acute distress vital signs stable she is afebrile head eyes ears nose and throat were negative except for bilateral hearing aids.  Neck was supple thyroid is not enlarged no carotid bruits.  Cardiopulmonary exam normal breast exam normal abdominal exam normal extremities normal skin normal peripheral pulses  normal except for trace edema and scars right shoulder and right triceps area from previous melanoma excisions.  Multiple scars lower extremities from previous biopsies.  1.  Hypertension at goal........ continue current therapy  2.  Status post IC  bleed........... asymptomatic  3.  Pulmonary nodule....... followed by pulmonary  4.  Peripheral edema.......... discussed various options.  She does have the thigh-high stockings which she does not wear.  She does also not want an increase in her diuretic.

## 2018-04-23 NOTE — Patient Instructions (Signed)
Labs today.......... I will call if there is anything abnormal  Continue current meds  Return in 1 year for general physical exam sooner if any problems  I would strongly recommend the shingles vaccine.

## 2018-04-24 ENCOUNTER — Encounter: Payer: Medicare Other | Admitting: Family Medicine

## 2018-04-30 ENCOUNTER — Encounter: Payer: Medicare Other | Admitting: Family Medicine

## 2018-05-02 ENCOUNTER — Other Ambulatory Visit: Payer: Self-pay | Admitting: Internal Medicine

## 2018-05-02 DIAGNOSIS — R918 Other nonspecific abnormal finding of lung field: Secondary | ICD-10-CM

## 2018-05-02 DIAGNOSIS — R911 Solitary pulmonary nodule: Secondary | ICD-10-CM

## 2018-05-08 ENCOUNTER — Encounter: Payer: Medicare Other | Admitting: Family Medicine

## 2018-06-25 ENCOUNTER — Ambulatory Visit (INDEPENDENT_AMBULATORY_CARE_PROVIDER_SITE_OTHER)
Admission: RE | Admit: 2018-06-25 | Discharge: 2018-06-25 | Disposition: A | Discharge status: Home / Self Care | Payer: Medicare Other | Source: Ambulatory Visit | Attending: Internal Medicine | Admitting: Internal Medicine

## 2018-06-25 DIAGNOSIS — R918 Other nonspecific abnormal finding of lung field: Secondary | ICD-10-CM | POA: Diagnosis not present

## 2018-06-25 DIAGNOSIS — R911 Solitary pulmonary nodule: Secondary | ICD-10-CM

## 2018-06-25 NOTE — Progress Notes (Signed)
LMTCB

## 2018-06-26 ENCOUNTER — Telehealth: Payer: Self-pay | Admitting: Internal Medicine

## 2018-06-26 DIAGNOSIS — R911 Solitary pulmonary nodule: Secondary | ICD-10-CM

## 2018-06-26 NOTE — Telephone Encounter (Signed)
Notes recorded by Tanda Rockers, MD on 06/25/2018 at 5:15 PM EST Call patient : Study is unchanged so radiology recs repeat one year - I can see in meantime prn new resp symptoms  ------------------------------------------------ Attempted to contact pt's daughter, [name]. I did not receive an answer. I have left a message for her to return our call.

## 2018-06-26 NOTE — Telephone Encounter (Signed)
Patient daughter is aware order is placed nothing further needed at this time.

## 2018-06-26 NOTE — Telephone Encounter (Signed)
Daughter [name] returned phone call

## 2018-07-27 ENCOUNTER — Telehealth: Payer: Self-pay | Admitting: Family Medicine

## 2018-07-27 NOTE — Telephone Encounter (Signed)
Copied from [place] [id number]. Topic: Quick Communication - See Telephone Encounter >> Jul 27, 2018  1:12 PM Selinda Flavin B, NT wrote: CRM for notification. See Telephone encounter for: 07/27/18. Patient's daughter, [name], calling and states that [facility] does not have the prescription for beclomethasone (QVAR REDIHALER) 40 MCG/ACT inhaler that Dr Sherren Mocha sent on 04/23/18. Please advise.  CB#: [phone]

## 2018-07-30 MED ORDER — BECLOMETHASONE DIPROP HFA 40 MCG/ACT IN AERB: 2.0000 | INHALATION_SPRAY | Freq: Two times a day (BID) | RESPIRATORY_TRACT | 2 refills | Status: DC

## 2018-07-30 NOTE — Telephone Encounter (Signed)
Rx sent.  Left message on machine for daughter to schedule an appointment with a new provider.

## 2018-08-03 ENCOUNTER — Telehealth: Payer: Self-pay | Admitting: *Deleted

## 2018-08-03 MED ORDER — BECLOMETHASONE DIPROP HFA 40 MCG/ACT IN AERB: 2.0000 | INHALATION_SPRAY | Freq: Two times a day (BID) | RESPIRATORY_TRACT | 2 refills | Status: DC

## 2018-08-03 NOTE — Telephone Encounter (Signed)
I attempted to submit a prior auth for Qvar Redihaler 58mcg via [email or web address] and a note stated the pt is not enrolled in [place] [employer] Part D.  I left a detailed message for the pt to call our office with insurance information for prescription coverage.

## 2018-08-03 NOTE — Telephone Encounter (Signed)
Pt states that her new insurance is [employer] and she will be using [place].  Member ID#:  [id number]. Pt states that this insurance will not cover her Qvar and she will just need the rx sent to [place]. She will have to pay for the med.

## 2018-10-25 ENCOUNTER — Telehealth: Payer: Self-pay | Admitting: Internal Medicine

## 2018-10-25 NOTE — Telephone Encounter (Unsigned)
Copied from [place] [phone]. Topic: General - Other >> Oct 25, 2018  4:03 PM Celene Kras A wrote: Reason for CRM: Pharmacy requesting call or fax medication request for pt. Refill for hydrochlorothiazide (MICROZIDE) 12.5 MG capsule [[id number]].  [facility] # [address], [place] - [place] [address] Fax [phone] Phone #[phone]

## 2019-06-04 ENCOUNTER — Other Ambulatory Visit: Payer: Self-pay | Admitting: Internal Medicine

## 2019-06-14 ENCOUNTER — Other Ambulatory Visit: Payer: Self-pay | Admitting: Internal Medicine

## 2019-06-14 DIAGNOSIS — R911 Solitary pulmonary nodule: Secondary | ICD-10-CM

## 2019-06-20 ENCOUNTER — Telehealth: Payer: Self-pay | Admitting: Internal Medicine

## 2019-06-20 NOTE — Telephone Encounter (Signed)
It is reasonably safe if use tight fitting mask (and follow the rules re hand washing, distancing) to the letter but if still  reluctant change to 6 months from now so we don't lose track of the problem  completely

## 2019-06-20 NOTE — Telephone Encounter (Signed)
Called and spoke with pt's daughter [name] letting her know the info stated by MW. [name] said she did not want to lose track of pt's problem but was concerned about pt having CT performed due to pt's age and covid currently still going on.  Stated to her if pts are having any symptoms, they aren't allowed to get any testing done until they have proof of negative covid test. [name] said that she did have a N-95 mask she would put pt in and would keep current CT appt. Nothing further needed.

## 2019-06-20 NOTE — Telephone Encounter (Signed)
Call returned to patient daughter [name] (dpr), confirmed pt DOB,  she states she is concerned with the rising covid numbers about taking her mother out to get her CT. She states if it is not important and she is okay she would rather hold off on the CT and do it at a later time if MW thought it could wait. Requesting recommendations.   MW please advise. Thanks.

## 2019-06-25 ENCOUNTER — Other Ambulatory Visit: Payer: Medicare Other

## 2019-07-01 ENCOUNTER — Inpatient Hospital Stay: Admission: RE | Admit: 2019-07-01 | Payer: Medicare Other | Source: Ambulatory Visit

## 2019-08-10 ENCOUNTER — Ambulatory Visit: Payer: Medicare Other | Attending: Internal Medicine

## 2019-08-10 DIAGNOSIS — Z23 Encounter for immunization: Secondary | ICD-10-CM

## 2019-08-10 NOTE — Progress Notes (Signed)
   Covid-19 Vaccination Clinic  Name:  CHALICE PHILBERT    MRN: [id number] DOB: 06-29-1929  08/10/2019  Ms. Haney was observed post Covid-19 immunization for 15 minutes without incidence. She was provided with Vaccine Information Sheet and instruction to access the V-Safe system.   Ms. Slabach was instructed to call 911 with any severe reactions post vaccine: Marland Kitchen Difficulty breathing  . Swelling of your face and throat  . A fast heartbeat  . A bad rash all over your body  . Dizziness and weakness    Immunizations Administered    Name Date Dose VIS Date Route   Pfizer COVID-19 Vaccine 08/10/2019  2:47 PM 0.3 mL 07/12/2019 Intramuscular   Manufacturer: [place]   Lot: [id number]   [place]: [id number]

## 2019-08-11 ENCOUNTER — Ambulatory Visit: Payer: Medicare Other

## 2019-08-30 ENCOUNTER — Ambulatory Visit: Payer: Medicare Other

## 2019-08-31 ENCOUNTER — Ambulatory Visit: Payer: Medicare Other

## 2019-08-31 ENCOUNTER — Ambulatory Visit: Payer: Medicare Other | Attending: Internal Medicine

## 2019-08-31 DIAGNOSIS — Z23 Encounter for immunization: Secondary | ICD-10-CM | POA: Insufficient documentation

## 2019-08-31 NOTE — Progress Notes (Signed)
   Covid-19 Vaccination Clinic  Name:  Taylor Blankenship    MRN: [id number] DOB: 1929/03/31  08/31/2019  [name] was observed post Covid-19 immunization for 15 minutes without incidence. She was provided with Vaccine Information Sheet and instruction to access the V-Safe system.   [name] was instructed to call 911 with any severe reactions post vaccine: Marland Kitchen Difficulty breathing  . Swelling of your face and throat  . A fast heartbeat  . A bad rash all over your body  . Dizziness and weakness    Immunizations Administered    Name Date Dose VIS Date Route   Pfizer COVID-19 Vaccine 08/31/2019  8:46 AM 0.3 mL 07/12/2019 Intramuscular   Manufacturer: [place]   Lot: [id number]   [place]: [id number]

## 2019-12-02 ENCOUNTER — Ambulatory Visit
Admission: RE | Admit: 2019-12-02 | Discharge: 2019-12-02 | Disposition: A | Discharge status: Home / Self Care | Payer: Medicare Other | Source: Ambulatory Visit | Attending: Internal Medicine | Admitting: Internal Medicine

## 2019-12-02 ENCOUNTER — Telehealth: Payer: Self-pay | Admitting: Internal Medicine

## 2019-12-02 DIAGNOSIS — R918 Other nonspecific abnormal finding of lung field: Secondary | ICD-10-CM

## 2019-12-02 DIAGNOSIS — R911 Solitary pulmonary nodule: Secondary | ICD-10-CM

## 2019-12-02 NOTE — Telephone Encounter (Signed)
Tanda Rockers, MD  12/02/2019 2:28 PM EDT    Call patient : Taylor Blankenship is c/w a still very small but slowly growing tumor and best way to proceed is PET but if wants to set up TELEVISIT with me to discuss it I can do that this week any day (though may be calling her at lunch if don't get to it in am as add on) (or can do PET first and televsit after)     Called and spoke with pt's daughter [name] as well as pt letting them know the results of the CT. [name] stated that pt was fine having PET scan done. Order has been placed for PET. Nothing further needed.

## 2019-12-02 NOTE — Progress Notes (Signed)
LMTCB

## 2019-12-11 ENCOUNTER — Ambulatory Visit (HOSPITAL_COMMUNITY)
Admission: RE | Admit: 2019-12-11 | Discharge: 2019-12-11 | Disposition: A | Discharge status: Home / Self Care | Payer: Medicare Other | Source: Ambulatory Visit | Attending: Internal Medicine | Admitting: Internal Medicine

## 2019-12-11 ENCOUNTER — Other Ambulatory Visit: Payer: Self-pay

## 2019-12-11 DIAGNOSIS — I700 Atherosclerosis of aorta: Secondary | ICD-10-CM | POA: Insufficient documentation

## 2019-12-11 DIAGNOSIS — R918 Other nonspecific abnormal finding of lung field: Secondary | ICD-10-CM

## 2019-12-11 LAB — GLUCOSE, CAPILLARY: Glucose-Capillary: 99 mg/dL (ref 70–99)

## 2019-12-11 MED ORDER — FLUDEOXYGLUCOSE F - 18 (FDG) INJECTION
8.8000 | Freq: Once | INTRAVENOUS | Status: AC | PRN
  Administered 2019-12-11: 8.8 via INTRAVENOUS

## 2019-12-13 ENCOUNTER — Ambulatory Visit (HOSPITAL_COMMUNITY): Payer: Medicare Other

## 2020-05-15 ENCOUNTER — Other Ambulatory Visit: Payer: Self-pay | Admitting: Internal Medicine

## 2020-05-15 DIAGNOSIS — N6320 Unspecified lump in the left breast, unspecified quadrant: Secondary | ICD-10-CM

## 2020-05-26 ENCOUNTER — Other Ambulatory Visit: Payer: Self-pay | Admitting: Internal Medicine

## 2020-05-26 ENCOUNTER — Other Ambulatory Visit: Payer: Self-pay

## 2020-05-26 ENCOUNTER — Ambulatory Visit
Admission: RE | Admit: 2020-05-26 | Discharge: 2020-05-26 | Disposition: A | Discharge status: Home / Self Care | Payer: Medicare Other | Source: Ambulatory Visit | Attending: Internal Medicine | Admitting: Internal Medicine

## 2020-05-26 DIAGNOSIS — N6320 Unspecified lump in the left breast, unspecified quadrant: Secondary | ICD-10-CM

## 2020-06-01 DIAGNOSIS — C801 Malignant (primary) neoplasm, unspecified: Secondary | ICD-10-CM

## 2020-06-01 HISTORY — DX: Malignant (primary) neoplasm, unspecified: C80.1

## 2020-06-03 ENCOUNTER — Ambulatory Visit
Admission: RE | Admit: 2020-06-03 | Discharge: 2020-06-03 | Disposition: A | Discharge status: Home / Self Care | Payer: Medicare Other | Source: Ambulatory Visit | Attending: Internal Medicine | Admitting: Internal Medicine

## 2020-06-03 ENCOUNTER — Other Ambulatory Visit: Payer: Self-pay

## 2020-06-03 DIAGNOSIS — N6320 Unspecified lump in the left breast, unspecified quadrant: Secondary | ICD-10-CM

## 2020-06-08 ENCOUNTER — Other Ambulatory Visit: Payer: Self-pay | Admitting: Internal Medicine

## 2020-06-08 ENCOUNTER — Ambulatory Visit
Admission: RE | Admit: 2020-06-08 | Discharge: 2020-06-08 | Disposition: A | Discharge status: Home / Self Care | Payer: Medicare Other | Source: Ambulatory Visit | Attending: Internal Medicine | Admitting: Internal Medicine

## 2020-06-08 ENCOUNTER — Other Ambulatory Visit: Payer: Self-pay

## 2020-06-08 DIAGNOSIS — C50412 Malignant neoplasm of upper-outer quadrant of left female breast: Secondary | ICD-10-CM

## 2020-06-10 ENCOUNTER — Encounter: Payer: Self-pay | Admitting: Adult Health

## 2020-06-10 DIAGNOSIS — Z170 Estrogen receptor positive status [ER+]: Secondary | ICD-10-CM | POA: Insufficient documentation

## 2020-06-10 DIAGNOSIS — C50112 Malignant neoplasm of central portion of left female breast: Secondary | ICD-10-CM | POA: Insufficient documentation

## 2020-06-15 ENCOUNTER — Ambulatory Visit: Payer: Self-pay | Admitting: Surgery

## 2020-06-15 DIAGNOSIS — D0512 Intraductal carcinoma in situ of left breast: Secondary | ICD-10-CM

## 2020-06-15 NOTE — H&P (View-Only) (Signed)
Taylor Blankenship Appointment: 06/15/2020 9:20 AM Location: [facility] Patient #: [id number] DOB: January 30, 1929 Married / Language: Taylor Blankenship / Race: White Female  History of Present Illness Taylor Moores [name] MD; 06/15/2020 12:52 PM) Patient words: 86 year old female who presents for a left breast mass. There is some mass in her left medial breast about 6 weeks ago. Workup showed on mammogram 8 mm mass left upper quadrant core biopsy consistent with DCIS intermediate grade. Her paternal aunt had breast cancer in her 65s. She is a retired [employer] scientist. She started doing mammograms about 2 years ago due to her advanced age but is very active and in very good overall health. Patient has some soreness in the left medial breast but otherwise no other complaints today. She denies breast pain, nipple discharge or breast mass elsewhere.                 CLINICAL DATA: Palpable lump in the left breast.  EXAM: DIGITAL DIAGNOSTIC BILATERAL MAMMOGRAM WITH CAD AND TOMO  ULTRASOUND LEFT BREAST  COMPARISON: Previous exam(s).  ACR Breast Density Category c: The breast tissue is heterogeneously dense, which may obscure small masses.  FINDINGS: There is a mass in the region of the palpable lump on the left. No other suspicious findings are seen in either breast.  Mammographic images were processed with CAD.  On physical exam, there is a superficial palpable lump pointed out by the patient on the left.  Targeted ultrasound is performed, showing a superficial mass in the left breast at 6 o'clock in the retroareolar region measuring 7 x 4 by 8 mm. There is tubular extension off of this mass, difficult to measure sonographically with correlating with the mammographic appearance. There is a prominent lymph node in the left axilla. The patient had her COVID vaccine 5 weeks ago.  IMPRESSION: Indeterminate newly palpable left breast mass. The mass is tubular and somewhat irregular  in shape with a dominant superficial component. Prominent lymph node in the left axilla. No other abnormalities.  RECOMMENDATION: Recommend ultrasound-guided biopsy of the dominant superficial component of the newly palpable left breast mass. If the biopsy is benign, recommend a 3 month follow-up ultrasound of the left axillary lymph nodes. If the biopsy demonstrates malignancy, recommend ultrasound-guided biopsy of the abnormal left axillary node.  I have discussed the findings and recommendations with the patient. If applicable, a reminder letter will be sent to the patient regarding the next appointment.  BI-RADS CATEGORY 4: Suspicious.   Electronically Signed By: Taylor Blankenship M.D On: 05/26/2020 14:09   ADDITIONAL INFORMATION: PROGNOSTIC INDICATORS Results: IMMUNOHISTOCHEMICAL AND MORPHOMETRIC ANALYSIS PERFORMED MANUALLY Estrogen Receptor: >95%, POSITIVE, STRONG STAINING INTENSITY Progesterone Receptor: >95%, POSITIVE, STRONG STAINING INTENSITY REFERENCE RANGE ESTROGEN RECEPTOR NEGATIVE 0% POSITIVE =>1% REFERENCE RANGE PROGESTERONE RECEPTOR NEGATIVE 0% POSITIVE =>1% All controls stained appropriately Taylor Sheller MD Pathologist, Electronic Signature ( Signed 06/08/2020) FINAL DIAGNOSIS Diagnosis Breast, left, needle core biopsy, left - 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 - SEE MICROSCOPIC DESCRIPTION. Microscopic Comment The carcinoma in situ is positive with E-cadherin and negative with cytokeratin 5/6. Immunohistochemistry for calponin, p63 and smooth muscle myosin show positive basal cell staining supporting the diagnosis. Estrogen and.  The patient is a 86 year old female.   Past Surgical History Taylor Blankenship, [place]; 06/15/2020 9:22 AM) Breast Biopsy Left. multiple Tonsillectomy  Diagnostic Studies History Taylor Blankenship, [place]; 06/15/2020 9:22 AM) Colonoscopy >10 years ago Mammogram within last year Pap  Smear 1-5 years ago  Allergies (  Taylor Blankenship, RMA; 06/15/2020 9:26 AM) Amoxicillin *PENICILLINS* Hives, Itching, Rash. Cephalexin *CEPHALOSPORINS* Hives, Rash, Itching. Doxycycline Monohydrate *CHEMICALS* Rash. Allergies Reconciled  Medication History Taylor Blankenship, [place]; 06/15/2020 9:28 AM) hydroCHLOROthiazide (12.5MG  Capsule, Oral) Active. Calcium Carbonate (600MG  Tablet, Oral) Active. Beclomethasone Dipropionate (40MCG/ACT Aerosol Soln, Nasal) Active. Multivitamin Adults (Oral) Active. Vitamin D (1000UNIT Tablet, Oral) Active. Medications Reconciled  Social History Taylor Blankenship, [place]; 06/15/2020 9:22 AM) Alcohol use Occasional alcohol use. Caffeine use Coffee. No drug use Tobacco use Former smoker.  Family History Taylor Blankenship, [place]; 06/15/2020 9:22 AM) Alcohol Abuse Father. Colon Cancer Mother. Colon Polyps Mother. Respiratory Condition Father.  Pregnancy / Birth History Taylor Blankenship, [place]; 06/15/2020 9:22 AM) Age at menarche 43 years. Age of menopause 29-55 Gravida 3 Length (months) of breastfeeding 3-6 Maternal age 59-25 Para 2  Other Problems Taylor Blankenship, [place]; 06/15/2020 9:22 AM) Breast Cancer Heart murmur Lump In Breast Melanoma     Review of Systems Taylor Blankenship RMA; 06/15/2020 9:22 AM) General Not Present- Appetite Loss, Chills, Fatigue, Fever, Night Sweats, Weight Gain and Weight Loss. Skin Present- Rash. Not Present- Change in Wart/Mole, Dryness, Hives, Jaundice, New Lesions, Non-Healing Wounds and Ulcer. HEENT Present- Hearing Loss, Ringing in the Ears, Seasonal Allergies and Wears glasses/contact lenses. Not Present- Earache, Hoarseness, Nose Bleed, Oral Ulcers, Sinus Pain, Sore Throat, Visual Disturbances and Yellow Eyes. Respiratory Not Present- Bloody sputum, Chronic Cough, Difficulty Breathing, Snoring and Wheezing. Breast Present- Breast Mass. Not Present- Breast Pain, Nipple Discharge and Skin  Changes. Cardiovascular Present- Palpitations and Shortness of Breath. Not Present- Chest Pain, Difficulty Breathing Lying Down, Leg Cramps, Rapid Heart Rate and Swelling of Extremities. Gastrointestinal Not Present- Abdominal Pain, Bloating, Bloody Stool, Change in Bowel Habits, Chronic diarrhea, Constipation, Difficulty Swallowing, Excessive gas, Gets full quickly at meals, Hemorrhoids, Indigestion, Nausea, Rectal Pain and Vomiting. Female Genitourinary Not Present- Frequency, Nocturia, Painful Urination, Pelvic Pain and Urgency. Musculoskeletal Not Present- Back Pain, Joint Pain, Joint Stiffness, Muscle Pain, Muscle Weakness and Swelling of Extremities. Neurological Not Present- Decreased Memory, Fainting, Headaches, Numbness, Seizures, Tingling, Tremor, Trouble walking and Weakness. Psychiatric Not Present- Anxiety, Bipolar, Change in Sleep Pattern, Depression, Fearful and Frequent crying. Endocrine Not Present- Cold Intolerance, Excessive Hunger, Hair Changes, Heat Intolerance, Hot flashes and New Diabetes. Hematology Present- Easy Bruising. Not Present- Blood Thinners, Excessive bleeding, Gland problems, HIV and Persistent Infections.  Vitals Taylor Haw [place] RMA; 06/15/2020 9:29 AM) 06/15/2020 9:28 AM Weight: 180.13 lb Height: 62in Body Surface Area: 1.83 m Body Mass Index: 32.94 kg/m  Temp.: 97.38F  Pulse: 84 (Regular)  P.OX: 97% (Room air) BP: 128/78(Sitting, Left Arm, Standard)        Physical Exam (Magdeline Prange A. Lachele Lievanos MD; 06/15/2020 12:53 PM)  General Mental Status-Alert. General Appearance-Consistent with stated age. Hydration-Well hydrated. Voice-Normal.  Head and Neck Head-normocephalic, atraumatic with no lesions or palpable masses. Trachea-midline. Thyroid Gland Characteristics - normal size and consistency.  Breast Note: Slight bruising left medial breast. Swelling from biopsy noted. No other breast masses noted. No evidence of nipple  discharge bilaterally.  Cardiovascular Cardiovascular examination reveals -normal heart sounds, regular rate and rhythm with no murmurs and normal pedal pulses bilaterally.  Neurologic Neurologic evaluation reveals -alert and oriented x 3 with no impairment of recent or remote memory. Mental Status-Normal.  Lymphatic Head & Neck  General Head & Neck Lymphatics: Bilateral - Description - Normal. Axillary  General Axillary Region: Bilateral - Description - Normal. Tenderness - Non Tender.    Assessment & Plan (Jouri Threat A. Anarely Nicholls MD; 06/15/2020 12:57  PM)  DUCTAL CARCINOMA IN SITU (DCIS) OF LEFT BREAST (D05.12) Impression: Long discussion treatment options given her advanced age but her overall health is good. Discussed medical management but given the fact that her mammogram stated she had a mass, she would not qualify for COMET. Discussed lumpectomy for her with the potential for possible postoperative radiation therapy and chemotherapy prevention as well as the possible risk of a grade 2 invasive disease diagnosis depending on final pathology. She is opted for left seed localized lumpectomy. I will refer her to medical and radiation oncology for opinions. Risk of lumpectomy include bleeding, infection, seroma, more surgery, use of seed/wire, wound care, cosmetic deformity and the need for other treatments, death , blood clots, death. Pt agrees to proceed.  TOTAL TIME 45 MINUTES  Current Plans The anatomy and the physiology was discussed. The pathophysiology and natural history of the disease was discussed. Options were discussed and recommendations were made. Technique, risks, benefits, & alternatives were discussed. Risks such as stroke, heart attack, bleeding, indection, death, and other risks discussed. Questions answered. The patient agrees to proceed. You are being scheduled for surgery- Our schedulers will call you.  You should hear from our office's scheduling  department within 5 working days about the location, date, and time of surgery. We try to make accommodations for patient's preferences in scheduling surgery, but sometimes the OR schedule or the surgeon's schedule prevents [name] from making those accommodations.  If you have not heard from our office [phone]) in 5 working days, call the office and ask for your surgeon's nurse.  If you have other questions about your diagnosis, plan, or surgery, call the office and ask for your surgeon's nurse.  Pt Education - [facility] Breast Cancer Information Given - [facility] "Breast Journey" Package Pt Education - [facility] breast cancer surgery: discussed with patient and provided information. Pt Education - [facility] Breast Biopsy HCI: discussed with patient and provided information.

## 2020-06-15 NOTE — H&P (Signed)
Taylor Blankenship Appointment: 06/15/2020 9:20 AM Location: [facility] Patient #: [id number] DOB: 05/03/1929 Married / Language: Taylor Blankenship / Race: White Female  History of Present Illness [name] A. [name] MD; 06/15/2020 12:52 PM) Patient words: 86 year old female who presents for a left breast mass. There is some mass in her left medial breast about 6 weeks ago. Workup showed on mammogram 8 mm mass left upper quadrant core biopsy consistent with DCIS intermediate grade. Her paternal aunt had breast cancer in her 30s. She is a retired Marine scientist. She started doing mammograms about 2 years ago due to her advanced age but is very active and in very good overall health. Patient has some soreness in the left medial breast but otherwise no other complaints today. She denies breast pain, nipple discharge or breast mass elsewhere.                 CLINICAL DATA: Palpable lump in the left breast.  EXAM: DIGITAL DIAGNOSTIC BILATERAL MAMMOGRAM WITH CAD AND TOMO  ULTRASOUND LEFT BREAST  COMPARISON: Previous exam(s).  ACR Breast Density Category c: The breast tissue is heterogeneously dense, which may obscure small masses.  FINDINGS: There is a mass in the region of the palpable lump on the left. No other suspicious findings are seen in either breast.  Mammographic images were processed with CAD.  On physical exam, there is a superficial palpable lump pointed out by the patient on the left.  Targeted ultrasound is performed, showing a superficial mass in the left breast at 6 o'clock in the retroareolar region measuring 7 x 4 by 8 mm. There is tubular extension off of this mass, difficult to measure sonographically with correlating with the mammographic appearance. There is a prominent lymph node in the left axilla. The patient had her COVID vaccine 5 weeks ago.  IMPRESSION: Indeterminate newly palpable left breast mass. The mass is tubular and somewhat irregular  in shape with a dominant superficial component. Prominent lymph node in the left axilla. No other abnormalities.  RECOMMENDATION: Recommend ultrasound-guided biopsy of the dominant superficial component of the newly palpable left breast mass. If the biopsy is benign, recommend a 3 month follow-up ultrasound of the left axillary lymph nodes. If the biopsy demonstrates malignancy, recommend ultrasound-guided biopsy of the abnormal left axillary node.  I have discussed the findings and recommendations with the patient. If applicable, a reminder letter will be sent to the patient regarding the next appointment.  BI-RADS CATEGORY 4: Suspicious.   Electronically Signed By: Dorise Bullion III M.D On: 05/26/2020 14:09   ADDITIONAL INFORMATION: PROGNOSTIC INDICATORS Results: IMMUNOHISTOCHEMICAL AND MORPHOMETRIC ANALYSIS PERFORMED MANUALLY Estrogen Receptor: >95%, POSITIVE, STRONG STAINING INTENSITY Progesterone Receptor: >95%, POSITIVE, STRONG STAINING INTENSITY REFERENCE RANGE ESTROGEN RECEPTOR NEGATIVE 0% POSITIVE =>1% REFERENCE RANGE PROGESTERONE RECEPTOR NEGATIVE 0% POSITIVE =>1% All controls stained appropriately Taylor Sheller MD Pathologist, Electronic Signature ( Signed 06/08/2020) FINAL DIAGNOSIS Diagnosis Breast, left, needle core biopsy, left - 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 - SEE MICROSCOPIC DESCRIPTION. Microscopic Comment The carcinoma in situ is positive with E-cadherin and negative with cytokeratin 5/6. Immunohistochemistry for calponin, p63 and smooth muscle myosin show positive basal cell staining supporting the diagnosis. Estrogen and.  The patient is a 86 year old female.   Past Surgical History [name], [place]; 06/15/2020 9:22 AM) Breast Biopsy Left. multiple Tonsillectomy  Diagnostic Studies History [name], [place]; 06/15/2020 9:22 AM) Colonoscopy >10 years ago Mammogram within last year Pap  Smear 1-5 years ago  Allergies (  [name], RMA; 06/15/2020 9:26 AM) Amoxicillin *PENICILLINS* Hives, Itching, Rash. Cephalexin *CEPHALOSPORINS* Hives, Rash, Itching. Doxycycline Monohydrate *CHEMICALS* Rash. Allergies Reconciled  Medication History [name], [place]; 06/15/2020 9:28 AM) hydroCHLOROthiazide (12.5MG  Capsule, Oral) Active. Calcium Carbonate (600MG  Tablet, Oral) Active. Beclomethasone Dipropionate (40MCG/ACT Aerosol Soln, Nasal) Active. Multivitamin Adults (Oral) Active. Vitamin D (1000UNIT Tablet, Oral) Active. Medications Reconciled  Social History [name], [place]; 06/15/2020 9:22 AM) Alcohol use Occasional alcohol use. Caffeine use Coffee. No drug use Tobacco use Former smoker.  Family History [name], [place]; 06/15/2020 9:22 AM) Alcohol Abuse Father. Colon Cancer Mother. Colon Polyps Mother. Respiratory Condition Father.  Pregnancy / Birth History [name], [place]; 06/15/2020 9:22 AM) Age at menarche 79 years. Age of menopause 34-55 Gravida 3 Length (months) of breastfeeding 3-6 Maternal age 51-25 Para 2  Other Problems [name], [place]; 06/15/2020 9:22 AM) Breast Cancer Heart murmur Lump In Breast Melanoma     Review of Systems [name] RMA; 06/15/2020 9:22 AM) General Not Present- Appetite Loss, Chills, Fatigue, Fever, Night Sweats, Weight Gain and Weight Loss. Skin Present- Rash. Not Present- Change in Wart/Mole, Dryness, Hives, Jaundice, New Lesions, Non-Healing Wounds and Ulcer. HEENT Present- Hearing Loss, Ringing in the Ears, Seasonal Allergies and Wears glasses/contact lenses. Not Present- Earache, Hoarseness, Nose Bleed, Oral Ulcers, Sinus Pain, Sore Throat, Visual Disturbances and Yellow Eyes. Respiratory Not Present- Bloody sputum, Chronic Cough, Difficulty Breathing, Snoring and Wheezing. Breast Present- Breast Mass. Not Present- Breast Pain, Nipple Discharge and Skin  Changes. Cardiovascular Present- Palpitations and Shortness of Breath. Not Present- Chest Pain, Difficulty Breathing Lying Down, Leg Cramps, Rapid Heart Rate and Swelling of Extremities. Gastrointestinal Not Present- Abdominal Pain, Bloating, Bloody Stool, Change in Bowel Habits, Chronic diarrhea, Constipation, Difficulty Swallowing, Excessive gas, Gets full quickly at meals, Hemorrhoids, Indigestion, Nausea, Rectal Pain and Vomiting. Female Genitourinary Not Present- Frequency, Nocturia, Painful Urination, Pelvic Pain and Urgency. Musculoskeletal Not Present- Back Pain, Joint Pain, Joint Stiffness, Muscle Pain, Muscle Weakness and Swelling of Extremities. Neurological Not Present- Decreased Memory, Fainting, Headaches, Numbness, Seizures, Tingling, Tremor, Trouble walking and Weakness. Psychiatric Not Present- Anxiety, Bipolar, Change in Sleep Pattern, Depression, Fearful and Frequent crying. Endocrine Not Present- Cold Intolerance, Excessive Hunger, Hair Changes, Heat Intolerance, Hot flashes and New Diabetes. Hematology Present- Easy Bruising. Not Present- Blood Thinners, Excessive bleeding, Gland problems, HIV and Persistent Infections.  Vitals Lattie Haw [place] RMA; 06/15/2020 9:29 AM) 06/15/2020 9:28 AM Weight: 180.13 lb Height: 62in Body Surface Area: 1.83 m Body Mass Index: 32.94 kg/m  Temp.: 97.67F  Pulse: 84 (Regular)  P.OX: 97% (Room air) BP: 128/78(Sitting, Left Arm, Standard)        Physical Exam (Jeffree Cazeau A. Aquilla Voiles MD; 06/15/2020 12:53 PM)  General Mental Status-Alert. General Appearance-Consistent with stated age. Hydration-Well hydrated. Voice-Normal.  Head and Neck Head-normocephalic, atraumatic with no lesions or palpable masses. Trachea-midline. Thyroid Gland Characteristics - normal size and consistency.  Breast Note: Slight bruising left medial breast. Swelling from biopsy noted. No other breast masses noted. No evidence of nipple  discharge bilaterally.  Cardiovascular Cardiovascular examination reveals -normal heart sounds, regular rate and rhythm with no murmurs and normal pedal pulses bilaterally.  Neurologic Neurologic evaluation reveals -alert and oriented x 3 with no impairment of recent or remote memory. Mental Status-Normal.  Lymphatic Head & Neck  General Head & Neck Lymphatics: Bilateral - Description - Normal. Axillary  General Axillary Region: Bilateral - Description - Normal. Tenderness - Non Tender.    Assessment & Plan (Rynell Ciotti A. Uriah Philipson MD; 06/15/2020 12:57  PM)  DUCTAL CARCINOMA IN SITU (DCIS) OF LEFT BREAST (D05.12) Impression: Long discussion treatment options given her advanced age but her overall health is good. Discussed medical management but given the fact that her mammogram stated she had a mass, she would not qualify for COMET. Discussed lumpectomy for her with the potential for possible postoperative radiation therapy and chemotherapy prevention as well as the possible risk of a grade 2 invasive disease diagnosis depending on final pathology. She is opted for left seed localized lumpectomy. I will refer her to medical and radiation oncology for opinions. Risk of lumpectomy include bleeding, infection, seroma, more surgery, use of seed/wire, wound care, cosmetic deformity and the need for other treatments, death , blood clots, death. Pt agrees to proceed.  TOTAL TIME 45 MINUTES  Current Plans The anatomy and the physiology was discussed. The pathophysiology and natural history of the disease was discussed. Options were discussed and recommendations were made. Technique, risks, benefits, & alternatives were discussed. Risks such as stroke, heart attack, bleeding, indection, death, and other risks discussed. Questions answered. The patient agrees to proceed. You are being scheduled for surgery- Our schedulers will call you.  You should hear from our office's scheduling  department within 5 working days about the location, date, and time of surgery. We try to make accommodations for patient's preferences in scheduling surgery, but sometimes the OR schedule or the surgeon's schedule prevents [name] from making those accommodations.  If you have not heard from our office [phone]) in 5 working days, call the office and ask for your surgeon's nurse.  If you have other questions about your diagnosis, plan, or surgery, call the office and ask for your surgeon's nurse.  Pt Education - [facility] Breast Cancer Information Given - [facility] "Breast Journey" Package Pt Education - [facility] breast cancer surgery: discussed with patient and provided information. Pt Education - [facility] Breast Biopsy HCI: discussed with patient and provided information.

## 2020-06-18 ENCOUNTER — Telehealth: Payer: Self-pay | Admitting: Hematology and Oncology

## 2020-06-18 NOTE — Telephone Encounter (Signed)
Received a new pt referral from Dr. Brantley Stage for DCIS. Taylor Blankenship has been scheduled to see Dr. Lindi Adie on 12/16 at 1pm. Appt date and time has been given to the pt's daughter.

## 2020-06-19 ENCOUNTER — Other Ambulatory Visit: Payer: Self-pay | Admitting: Surgery

## 2020-06-19 DIAGNOSIS — D0512 Intraductal carcinoma in situ of left breast: Secondary | ICD-10-CM

## 2020-06-23 ENCOUNTER — Other Ambulatory Visit: Payer: Self-pay

## 2020-06-23 ENCOUNTER — Encounter (HOSPITAL_BASED_OUTPATIENT_CLINIC_OR_DEPARTMENT_OTHER): Payer: Self-pay | Admitting: Surgery

## 2020-06-29 ENCOUNTER — Encounter (HOSPITAL_BASED_OUTPATIENT_CLINIC_OR_DEPARTMENT_OTHER)
Admission: RE | Admit: 2020-06-29 | Discharge: 2020-06-29 | Disposition: A | Discharge status: Home / Self Care | Payer: Medicare Other | Source: Ambulatory Visit | Attending: Surgery | Admitting: Surgery

## 2020-06-29 ENCOUNTER — Other Ambulatory Visit (HOSPITAL_COMMUNITY)
Admission: RE | Admit: 2020-06-29 | Discharge: 2020-06-29 | Disposition: A | Discharge status: Home / Self Care | Payer: Medicare Other | Source: Ambulatory Visit | Attending: Surgery | Admitting: Surgery

## 2020-06-29 DIAGNOSIS — I10 Essential (primary) hypertension: Secondary | ICD-10-CM | POA: Insufficient documentation

## 2020-06-29 DIAGNOSIS — Z01812 Encounter for preprocedural laboratory examination: Secondary | ICD-10-CM | POA: Insufficient documentation

## 2020-06-29 DIAGNOSIS — D0512 Intraductal carcinoma in situ of left breast: Secondary | ICD-10-CM | POA: Insufficient documentation

## 2020-06-29 DIAGNOSIS — Z01818 Encounter for other preprocedural examination: Secondary | ICD-10-CM | POA: Insufficient documentation

## 2020-06-29 DIAGNOSIS — Z20822 Contact with and (suspected) exposure to covid-19: Secondary | ICD-10-CM | POA: Diagnosis not present

## 2020-06-29 LAB — COMPREHENSIVE METABOLIC PANEL
ALT: 30 U/L (ref 0–44)
AST: 39 U/L (ref 15–41)
Albumin: 3.9 g/dL (ref 3.5–5.0)
Alkaline Phosphatase: 65 U/L (ref 38–126)
Anion gap: 11 (ref 5–15)
BUN: 15 mg/dL (ref 8–23)
CO2: 24 mmol/L (ref 22–32)
Calcium: 9.5 mg/dL (ref 8.9–10.3)
Chloride: 105 mmol/L (ref 98–111)
Creatinine, Ser: 0.84 mg/dL (ref 0.44–1.00)
GFR, Estimated: >60 mL/min (ref >60)
Glucose, Bld: 102 mg/dL — ABNORMAL HIGH (ref 70–99)
Potassium: 3.9 mmol/L (ref 3.5–5.1)
Sodium: 140 mmol/L (ref 135–145)
Total Bilirubin: 1.1 mg/dL (ref 0.3–1.2)
Total Protein: 6.9 g/dL (ref 6.5–8.1)

## 2020-06-29 LAB — CBC WITH DIFFERENTIAL/PLATELET
Abs Immature Granulocytes: 0.02 10*3/uL (ref 0.00–0.07)
Basophils Absolute: 0.1 10*3/uL (ref 0.0–0.1)
Basophils Relative: 2 %
Eosinophils Absolute: 0.4 10*3/uL (ref 0.0–0.5)
Eosinophils Relative: 6 %
HCT: 43.5 % (ref 36.0–46.0)
Hemoglobin: 14.5 g/dL (ref 12.0–15.0)
Immature Granulocytes: 0 %
Lymphocytes Relative: 32 %
Lymphs Abs: 2.1 10*3/uL (ref 0.7–4.0)
MCH: 30.5 pg (ref 26.0–34.0)
MCHC: 33.3 g/dL (ref 30.0–36.0)
MCV: 91.4 fL (ref 80.0–100.0)
Monocytes Absolute: 0.6 10*3/uL (ref 0.1–1.0)
Monocytes Relative: 10 %
Neutro Abs: 3.3 10*3/uL (ref 1.7–7.7)
Neutrophils Relative %: 50 %
Platelets: 204 10*3/uL (ref 150–400)
RBC: 4.76 MIL/uL (ref 3.87–5.11)
RDW: 12.8 % (ref 11.5–15.5)
WBC: 6.5 10*3/uL (ref 4.0–10.5)
nRBC: 0 % (ref 0.0–0.2)

## 2020-06-29 LAB — SARS CORONAVIRUS 2 (TAT 6-24 HRS): SARS Coronavirus 2: NEGATIVE

## 2020-06-29 NOTE — Progress Notes (Signed)

## 2020-06-30 ENCOUNTER — Ambulatory Visit
Admission: RE | Admit: 2020-06-30 | Discharge: 2020-06-30 | Disposition: A | Discharge status: Home / Self Care | Payer: Medicare Other | Source: Ambulatory Visit | Attending: Surgery | Admitting: Surgery

## 2020-06-30 ENCOUNTER — Other Ambulatory Visit: Payer: Self-pay | Admitting: Surgery

## 2020-06-30 ENCOUNTER — Other Ambulatory Visit: Payer: Self-pay

## 2020-06-30 DIAGNOSIS — D0512 Intraductal carcinoma in situ of left breast: Secondary | ICD-10-CM

## 2020-07-01 DIAGNOSIS — C50919 Malignant neoplasm of unspecified site of unspecified female breast: Secondary | ICD-10-CM

## 2020-07-01 HISTORY — DX: Malignant neoplasm of unspecified site of unspecified female breast: C50.919

## 2020-07-02 ENCOUNTER — Encounter (HOSPITAL_BASED_OUTPATIENT_CLINIC_OR_DEPARTMENT_OTHER)
Admission: RE | Disposition: A | Discharge status: Home / Self Care | Payer: Self-pay | Source: Home / Self Care | Attending: Surgery

## 2020-07-02 ENCOUNTER — Ambulatory Visit (HOSPITAL_BASED_OUTPATIENT_CLINIC_OR_DEPARTMENT_OTHER): Payer: Medicare Other | Admitting: Anesthesiology

## 2020-07-02 ENCOUNTER — Ambulatory Visit
Admission: RE | Admit: 2020-07-02 | Discharge: 2020-07-02 | Disposition: A | Discharge status: Home / Self Care | Payer: Medicare Other | Source: Ambulatory Visit | Attending: Surgery | Admitting: Surgery

## 2020-07-02 ENCOUNTER — Ambulatory Visit (HOSPITAL_COMMUNITY)
Admission: RE | Admit: 2020-07-02 | Discharge: 2020-07-02 | Disposition: A | Discharge status: Home / Self Care | Payer: Medicare Other | Attending: Surgery | Admitting: Surgery

## 2020-07-02 ENCOUNTER — Other Ambulatory Visit: Payer: Self-pay

## 2020-07-02 ENCOUNTER — Encounter (HOSPITAL_BASED_OUTPATIENT_CLINIC_OR_DEPARTMENT_OTHER): Payer: Self-pay | Admitting: Surgery

## 2020-07-02 DIAGNOSIS — D0512 Intraductal carcinoma in situ of left breast: Secondary | ICD-10-CM

## 2020-07-02 DIAGNOSIS — Z881 Allergy status to other antibiotic agents status: Secondary | ICD-10-CM | POA: Diagnosis not present

## 2020-07-02 DIAGNOSIS — Z79899 Other long term (current) drug therapy: Secondary | ICD-10-CM | POA: Insufficient documentation

## 2020-07-02 DIAGNOSIS — Z87891 Personal history of nicotine dependence: Secondary | ICD-10-CM | POA: Diagnosis not present

## 2020-07-02 DIAGNOSIS — Z880 Allergy status to penicillin: Secondary | ICD-10-CM | POA: Diagnosis not present

## 2020-07-02 HISTORY — PX: BREAST LUMPECTOMY WITH RADIOACTIVE SEED LOCALIZATION: SHX6424

## 2020-07-02 HISTORY — DX: Other complications of anesthesia, initial encounter: T88.59XA

## 2020-07-02 HISTORY — DX: Unspecified asthma, uncomplicated: J45.909

## 2020-07-02 SURGERY — Surgical Case
Anesthesia: *Unknown

## 2020-07-02 SURGERY — BREAST LUMPECTOMY WITH RADIOACTIVE SEED LOCALIZATION
Anesthesia: General | Site: Breast | Laterality: Left

## 2020-07-02 MED ORDER — CLINDAMYCIN PHOSPHATE 900 MG/50ML IV SOLN
INTRAVENOUS | Status: AC
  Filled 2020-07-02: qty 50

## 2020-07-02 MED ORDER — MEPERIDINE HCL 25 MG/ML IJ SOLN: 6.2500 mg | INTRAMUSCULAR | Status: DC | PRN

## 2020-07-02 MED ORDER — CHLORHEXIDINE GLUCONATE CLOTH 2 % EX PADS: 6.0000 | MEDICATED_PAD | Freq: Once | CUTANEOUS | Status: DC

## 2020-07-02 MED ORDER — DEXAMETHASONE SODIUM PHOSPHATE 10 MG/ML IJ SOLN
INTRAMUSCULAR | Status: DC | PRN
  Administered 2020-07-02: 10 mg via INTRAVENOUS

## 2020-07-02 MED ORDER — BUPIVACAINE HCL (PF) 0.25 % IJ SOLN
INTRAMUSCULAR | Status: DC | PRN
  Administered 2020-07-02: 18 mL

## 2020-07-02 MED ORDER — LIDOCAINE 2% (20 MG/ML) 5 ML SYRINGE
INTRAMUSCULAR | Status: AC
  Filled 2020-07-02: qty 5

## 2020-07-02 MED ORDER — PROPOFOL 10 MG/ML IV BOLUS
INTRAVENOUS | Status: DC | PRN
  Administered 2020-07-02: 150 mg via INTRAVENOUS

## 2020-07-02 MED ORDER — PROPOFOL 10 MG/ML IV BOLUS
INTRAVENOUS | Status: AC
  Filled 2020-07-02: qty 20

## 2020-07-02 MED ORDER — DEXAMETHASONE SODIUM PHOSPHATE 10 MG/ML IJ SOLN
INTRAMUSCULAR | Status: AC
  Filled 2020-07-02: qty 1

## 2020-07-02 MED ORDER — LACTATED RINGERS IV SOLN: INTRAVENOUS | Status: DC

## 2020-07-02 MED ORDER — HYDROMORPHONE HCL 1 MG/ML IJ SOLN: 0.2500 mg | INTRAMUSCULAR | Status: DC | PRN

## 2020-07-02 MED ORDER — CLINDAMYCIN PHOSPHATE 900 MG/50ML IV SOLN
900.0000 mg | INTRAVENOUS | Status: AC
  Administered 2020-07-02: 900 mg via INTRAVENOUS

## 2020-07-02 MED ORDER — FENTANYL CITRATE (PF) 100 MCG/2ML IJ SOLN
INTRAMUSCULAR | Status: AC
  Filled 2020-07-02: qty 2

## 2020-07-02 MED ORDER — ONDANSETRON HCL 4 MG/2ML IJ SOLN
INTRAMUSCULAR | Status: AC
  Filled 2020-07-02: qty 2

## 2020-07-02 MED ORDER — ONDANSETRON HCL 4 MG/2ML IJ SOLN
INTRAMUSCULAR | Status: DC | PRN
  Administered 2020-07-02: 4 mg via INTRAVENOUS

## 2020-07-02 MED ORDER — LIDOCAINE 2% (20 MG/ML) 5 ML SYRINGE
INTRAMUSCULAR | Status: DC | PRN
  Administered 2020-07-02: 60 mg via INTRAVENOUS

## 2020-07-02 MED ORDER — ONDANSETRON HCL 4 MG/2ML IJ SOLN: 4.0000 mg | Freq: Once | INTRAMUSCULAR | Status: DC | PRN

## 2020-07-02 MED ORDER — HYDROCODONE-ACETAMINOPHEN 5-325 MG PO TABS: 1.0000 | ORAL_TABLET | Freq: Four times a day (QID) | ORAL | 0 refills | Status: DC | PRN

## 2020-07-02 MED ORDER — FENTANYL CITRATE (PF) 100 MCG/2ML IJ SOLN
INTRAMUSCULAR | Status: DC | PRN
  Administered 2020-07-02: 25 ug via INTRAVENOUS

## 2020-07-02 SURGICAL SUPPLY — 53 items
ADH SKN CLS APL DERMABOND .7 (GAUZE/BANDAGES/DRESSINGS) ×1
APL PRP STRL LF DISP 70% ISPRP (MISCELLANEOUS) ×1
APPLIER CLIP 9.375 MED OPEN (MISCELLANEOUS) ×3
APR CLP MED 9.3 20 MLT OPN (MISCELLANEOUS) ×1
BINDER BREAST LRG (GAUZE/BANDAGES/DRESSINGS) IMPLANT
BINDER BREAST MEDIUM (GAUZE/BANDAGES/DRESSINGS) IMPLANT
BINDER BREAST XLRG (GAUZE/BANDAGES/DRESSINGS) ×2 IMPLANT
BINDER BREAST XXLRG (GAUZE/BANDAGES/DRESSINGS) IMPLANT
BLADE SURG 15 STRL LF DISP TIS (BLADE) ×1 IMPLANT
BLADE SURG 15 STRL SS (BLADE) ×3
CANISTER SUC SOCK COL 7IN (MISCELLANEOUS) IMPLANT
CANISTER SUCT 1200ML W/VALVE (MISCELLANEOUS) ×2 IMPLANT
CHLORAPREP W/TINT 26 (MISCELLANEOUS) ×3 IMPLANT
CLIP APPLIE 9.375 MED OPEN (MISCELLANEOUS) IMPLANT
COVER BACK TABLE 60X90IN (DRAPES) ×3 IMPLANT
COVER MAYO STAND STRL (DRAPES) ×3 IMPLANT
COVER PROBE W GEL 5X96 (DRAPES) ×3 IMPLANT
COVER WAND RF STERILE (DRAPES) IMPLANT
DECANTER SPIKE VIAL GLASS SM (MISCELLANEOUS) ×2 IMPLANT
DERMABOND ADVANCED (GAUZE/BANDAGES/DRESSINGS) ×2
DERMABOND ADVANCED .7 DNX12 (GAUZE/BANDAGES/DRESSINGS) ×1 IMPLANT
DRAPE LAPAROSCOPIC ABDOMINAL (DRAPES) IMPLANT
DRAPE LAPAROTOMY 100X72 PEDS (DRAPES) ×3 IMPLANT
DRAPE UTILITY XL STRL (DRAPES) ×3 IMPLANT
ELECT COATED BLADE 2.86 ST (ELECTRODE) ×3 IMPLANT
ELECT REM PT RETURN 9FT ADLT (ELECTROSURGICAL) ×3
ELECTRODE REM PT RTRN 9FT ADLT (ELECTROSURGICAL) ×1 IMPLANT
GLOVE BIO SURGEON STRL SZ7 (GLOVE) ×2 IMPLANT
GLOVE BIOGEL PI IND STRL 8 (GLOVE) ×1 IMPLANT
GLOVE BIOGEL PI INDICATOR 8 (GLOVE) ×2
GLOVE ECLIPSE 8.0 STRL XLNG CF (GLOVE) ×3 IMPLANT
GLOVE SURG UNDER POLY LF SZ7 (GLOVE) ×4 IMPLANT
GOWN STRL REUS W/ TWL LRG LVL3 (GOWN DISPOSABLE) ×2 IMPLANT
GOWN STRL REUS W/TWL LRG LVL3 (GOWN DISPOSABLE) ×6
HEMOSTAT ARISTA ABSORB 3G PWDR (HEMOSTASIS) IMPLANT
HEMOSTAT SNOW SURGICEL 2X4 (HEMOSTASIS) IMPLANT
KIT MARKER MARGIN INK (KITS) ×3 IMPLANT
NDL HYPO 25X1 1.5 SAFETY (NEEDLE) ×1 IMPLANT
NEEDLE HYPO 25X1 1.5 SAFETY (NEEDLE) ×3 IMPLANT
NS IRRIG 1000ML POUR BTL (IV SOLUTION) ×3 IMPLANT
PACK BASIN DAY SURGERY FS (CUSTOM PROCEDURE TRAY) ×3 IMPLANT
PENCIL SMOKE EVACUATOR (MISCELLANEOUS) ×3 IMPLANT
SLEEVE SCD COMPRESS KNEE MED (MISCELLANEOUS) ×3 IMPLANT
SPONGE LAP 4X18 RFD (DISPOSABLE) ×3 IMPLANT
SUT MNCRL AB 4-0 PS2 18 (SUTURE) ×3 IMPLANT
SUT SILK 2 0 SH (SUTURE) IMPLANT
SUT VICRYL 3-0 CR8 SH (SUTURE) ×3 IMPLANT
SYR CONTROL 10ML LL (SYRINGE) ×3 IMPLANT
TOWEL GREEN STERILE FF (TOWEL DISPOSABLE) ×3 IMPLANT
TRAY FAXITRON CT DISP (TRAY / TRAY PROCEDURE) ×3 IMPLANT
TUBE CONNECTING 20'X1/4 (TUBING) ×1
TUBE CONNECTING 20X1/4 (TUBING) ×1 IMPLANT
YANKAUER SUCT BULB TIP NO VENT (SUCTIONS) ×2 IMPLANT

## 2020-07-02 NOTE — Interval H&P Note (Signed)
History and Physical Interval Note:  07/02/2020 9:42 AM  Taylor Blankenship  has presented today for surgery, with the diagnosis of ductal carcinoma left breast.  The various methods of treatment have been discussed with the patient and family. After consideration of risks, benefits and other options for treatment, the patient has consented to  Procedure(s): LEFT BREAST LUMPECTOMY WITH RADIOACTIVE SEED LOCALIZATION (Left) as a surgical intervention.  The patient's history has been reviewed, patient examined, no change in status, stable for surgery.  I have reviewed the patient's chart and labs.  Questions were answered to the patient's satisfaction.     [name]

## 2020-07-02 NOTE — Anesthesia Preprocedure Evaluation (Signed)
Anesthesia Evaluation  Patient identified by MRN, date of birth, ID band Patient awake    Reviewed: Allergy & Precautions, NPO status , Patient's Chart, lab work & pertinent test results  Airway Mallampati: II  TM Distance: >3 FB Neck ROM: Full    Dental   Pulmonary former smoker,    Pulmonary exam normal        Cardiovascular hypertension, Pt. on medications Normal cardiovascular exam     Neuro/Psych    GI/Hepatic   Endo/Other    Renal/GU      Musculoskeletal   Abdominal   Peds  Hematology   Anesthesia Other Findings   Reproductive/Obstetrics                             Anesthesia Physical Anesthesia Plan  ASA: II  Anesthesia Plan: General   Post-op Pain Management:    Induction: Intravenous  PONV Risk Score and Plan: 3 and Ondansetron and Treatment may vary due to age or medical condition  Airway Management Planned: LMA  Additional Equipment:   Intra-op Plan:   Post-operative Plan: Extubation in OR  Informed Consent: I have reviewed the patients History and Physical, chart, labs and discussed the procedure including the risks, benefits and alternatives for the proposed anesthesia with the patient or authorized representative who has indicated his/her understanding and acceptance.       Plan Discussed with: CRNA and Surgeon  Anesthesia Plan Comments:         Anesthesia Quick Evaluation

## 2020-07-02 NOTE — Transfer of Care (Signed)
Immediate Anesthesia Transfer of Care Note  Patient: Taylor Blankenship  Procedure(s) Performed: LEFT BREAST LUMPECTOMY WITH RADIOACTIVE SEED LOCALIZATION (Left Breast)  Patient Location: PACU  Anesthesia Type:General  Level of Consciousness: awake and drowsy  Airway & Oxygen Therapy: Patient Spontanous Breathing and Patient connected to face mask oxygen  Post-op Assessment: Report given to RN and Post -op Vital signs reviewed and stable  Post vital signs: Reviewed and stable  Last Vitals:  Vitals Value Taken Time  BP 123/59 07/02/20 1055  Temp    Pulse 64 07/02/20 1058  Resp 12 07/02/20 1058  SpO2 97 % 07/02/20 1058  Vitals shown include unvalidated device data.  Last Pain:  Vitals:   07/02/20 0855  TempSrc: Oral  PainSc: 0-No pain      Patients Stated Pain Goal: 3 (43/27/61 [id number])  Complications: No complications documented.

## 2020-07-02 NOTE — Anesthesia Procedure Notes (Signed)
Procedure Name: LMA Insertion Date/Time: 07/02/2020 10:07 AM Performed by: [place] Jocie Meroney C, CRNA Pre-anesthesia Checklist: Patient identified, Emergency Drugs available, Suction available and Patient being monitored Patient Re-evaluated:Patient Re-evaluated prior to induction Oxygen Delivery Method: Circle system utilized Preoxygenation: Pre-oxygenation with 100% oxygen Induction Type: IV induction Ventilation: Mask ventilation without difficulty LMA: LMA inserted LMA Size: 4.0 Number of attempts: 1 Airway Equipment and Method: Bite block Placement Confirmation: positive ETCO2 Tube secured with: Tape Dental Injury: Teeth and Oropharynx as per pre-operative assessment

## 2020-07-02 NOTE — Discharge Instructions (Signed)
[facility] °Office Phone Number [phone] ° °BREAST BIOPSY/ PARTIAL MASTECTOMY: POST OP INSTRUCTIONS ° °Always review your discharge instruction sheet given to you by the facility where your surgery was performed. ° °IF YOU HAVE DISABILITY OR FAMILY LEAVE FORMS, YOU MUST BRING THEM TO THE OFFICE FOR PROCESSING.  DO NOT GIVE THEM TO YOUR DOCTOR. ° °1. A prescription for pain medication may be given to you upon discharge.  Take your pain medication as prescribed, if needed.  If narcotic pain medicine is not needed, then you may take acetaminophen (Tylenol) or ibuprofen (Advil) as needed. °2. Take your usually prescribed medications unless otherwise directed °3. If you need a refill on your pain medication, please contact your pharmacy.  They will contact our office to request authorization.  Prescriptions will not be filled after 5pm or on week-ends. °4. You should eat very light the first 24 hours after surgery, such as soup, crackers, pudding, etc.  Resume your normal diet the day after surgery. °5. Most patients will experience some swelling and bruising in the breast.  Ice packs and a good support bra will help.  Swelling and bruising can take several days to resolve.  °6. It is common to experience some constipation if taking pain medication after surgery.  Increasing fluid intake and taking a stool softener will usually help or prevent this problem from occurring.  A mild laxative (Milk of Magnesia or Miralax) should be taken according to package directions if there are no bowel movements after 48 hours. °7. Unless discharge instructions indicate otherwise, you may remove your bandages 24-48 hours after surgery, and you may shower at that time.  You may have steri-strips (small skin tapes) in place directly over the incision.  These strips should be left on the skin for 7-10 days.  If your surgeon used skin glue on the incision, you may shower in 24 hours.  The glue will flake off over the  next 2-3 weeks.  Any sutures or staples will be removed at the office during your follow-up visit. °8. ACTIVITIES:  You may resume regular daily activities (gradually increasing) beginning the next day.  Wearing a good support bra or sports bra minimizes pain and swelling.  You may have sexual intercourse when it is comfortable. °a. You may drive when you no longer are taking prescription pain medication, you can comfortably wear a seatbelt, and you can safely maneuver your car and apply brakes. °b. RETURN TO WORK:  ______________________________________________________________________________________ °9. You should see your doctor in the office for a follow-up appointment approximately two weeks after your surgery.  Your doctor’s nurse will typically make your follow-up appointment when she calls you with your pathology report.  Expect your pathology report 2-3 business days after your surgery.  You may call to check if you do not hear from us after three days. °10. OTHER INSTRUCTIONS: _______________________________________________________________________________________________ _____________________________________________________________________________________________________________________________________ °_____________________________________________________________________________________________________________________________________ °_____________________________________________________________________________________________________________________________________ ° °WHEN TO CALL YOUR DOCTOR: °1. Fever over 101.0 °2. Nausea and/or vomiting. °3. Extreme swelling or bruising. °4. Continued bleeding from incision. °5. Increased pain, redness, or drainage from the incision. ° °The clinic staff is available to answer your questions during regular business hours.  Please don’t hesitate to call and ask to speak to one of the nurses for clinical concerns.  If you have a medical emergency, go to the nearest  emergency room or call 911.  A surgeon from [facility] is always on call at the hospital. ° °For further questions, please visit [email or web address]  ° ° ° ° °  Post Anesthesia Home Care Instructions ° °Activity: °Get plenty of rest for the remainder of the day. A responsible individual must stay with you for 24 hours following the procedure.  °For the next 24 hours, DO NOT: °-Drive a car °-Operate machinery °-Drink alcoholic beverages °-Take any medication unless instructed by your physician °-Make any legal decisions or sign important papers. ° °Meals: °Start with liquid foods such as gelatin or soup. Progress to regular foods as tolerated. Avoid greasy, spicy, heavy foods. If nausea and/or vomiting occur, drink only clear liquids until the nausea and/or vomiting subsides. Call your physician if vomiting continues. ° °Special Instructions/Symptoms: °Your throat may feel dry or sore from the anesthesia or the breathing tube placed in your throat during surgery. If this causes discomfort, gargle with warm salt water. The discomfort should disappear within 24 hours. ° °If you had a scopolamine patch placed behind your ear for the management of post- operative nausea and/or vomiting: ° °1. The medication in the patch is effective for 72 hours, after which it should be removed.  Wrap patch in a tissue and discard in the trash. Wash hands thoroughly with soap and water. °2. You may remove the patch earlier than 72 hours if you experience unpleasant side effects which may include dry mouth, dizziness or visual disturbances. °3. Avoid touching the patch. Wash your hands with soap and water after contact with the patch. °  ° °

## 2020-07-02 NOTE — Anesthesia Postprocedure Evaluation (Signed)
Anesthesia Post Note  Patient: Taylor Blankenship  Procedure(s) Performed: LEFT BREAST LUMPECTOMY WITH RADIOACTIVE SEED LOCALIZATION (Left Breast)     Patient location during evaluation: PACU Anesthesia Type: General Level of consciousness: awake and alert Pain management: pain level controlled Vital Signs Assessment: post-procedure vital signs reviewed and stable Respiratory status: spontaneous breathing, nonlabored ventilation, respiratory function stable and patient connected to nasal cannula oxygen Cardiovascular status: blood pressure returned to baseline and stable Postop Assessment: no apparent nausea or vomiting Anesthetic complications: no   No complications documented.  Last Vitals:  Vitals:   07/02/20 1130 07/02/20 1145  BP: 110/77 123/66  Pulse: (!) 57 62  Resp: (!) 9 16  Temp:  36.6 C  SpO2: 100% 100%    Last Pain:  Vitals:   07/02/20 1115  TempSrc:   PainSc: 0-No pain                 Sonali Wivell DAVID

## 2020-07-02 NOTE — Op Note (Signed)
Preoperative diagnosis: Left breast DCIS  Postoperative diagnosis: Same  Procedure: Left breast seed localized lumpectomy  Surgeon: Erroll Luna, MD  Anesthesia: LMA with 0.25% local  EBL: Minimal  Drains: None  IV fluids: Per anesthesia record  Indications for procedure: The patient is an 86 year old female who is in very good health who presented for evaluation of an abnormal left breast mass that was palpated.  This was film examination.  Core biopsy was done which showed an area of DCIS in the central subareolar breast.  She was a very good health we discussed options of observation versus medical management versus surgical excision.  She opted for lumpectomy as her option after reviewing the risks especially at her advanced age.The procedure has been discussed with the patient. Alternatives to surgery have been discussed with the patient.  Risks of surgery include bleeding,  Infection, cosmetic deformity, seroma formation, death, DVT, cardiovascular event, worsening of underlying medical problems, seroma formation, death,  and the need for further surgery.   The patient understands and wishes to proceed.   Description of procedure: The patient was met in the holding area and questions were answered.  Neoprobe used to identify the seed in the central left breast.  She was then taken back to the operating room and placed supine upon the OR table.  After induction of general anesthesia, the left breast was prepped and draped in a sterile fashion and timeout was performed.  Proper patient, site and procedure were verified.  Neoprobe used to identify the seed which was located in the central subareolar left breast.  Curvilinear incision made along the inferior border of the nipple areolar complex.  Dissection was carried and all tissue around the seed and clip were excised with a grossly negative margin the closest margin was anterior and I shaved the undersurface of the nipple and sent this  separately.  All tissue was oriented with ink and sent to pathology.  Cavity was irrigated with saline.  Hemostasis achieved with cautery and closed with 3-0 Vicryl and 4 Monocryl.  Dermabond applied.  All counts found to be correct.  Breast binder placed.  The patient was then awoke extubated taken to recovery in satisfactory condition.

## 2020-07-03 ENCOUNTER — Encounter (HOSPITAL_BASED_OUTPATIENT_CLINIC_OR_DEPARTMENT_OTHER): Payer: Self-pay | Admitting: Surgery

## 2020-07-06 LAB — SURGICAL PATHOLOGY

## 2020-07-14 NOTE — Progress Notes (Signed)
Location of Breast Cancer: Left Breast  Did patient present with symptoms (if so, please note symptoms) or was this found on screening mammography?: Patient noted a mass in her breast.  Diagnostic Mammogram: Indeterminate newly palpable left breast mass.  The mass is tubular and somewhat irregular in shape with a dominant superficial component.  Prominent lymph node in the left axilla.  No other abnormalities.  Mammogram: 8 mm mass in the left upper quadrant.  Histology per Pathology Report: Left Lumpectomy 07/02/2020   Biopsy: Left Breast 06/03/2020  Receptor Status: ER(>95% +), PR (>95% +), Her2-neu (), Ki-()    Past/Anticipated interventions by surgeon, if any: Dr. Brantley Stage -Left breast lumpectomy with radioactive seed localization 07/02/2020   Past/Anticipated interventions by medical oncology, if any: Chemotherapy  Dr. Lindi Adie 07/16/2020 1:00 pm   Lymphedema issues, if any:  No  Pain issues, if any:  No  SAFETY ISSUES:  Prior radiation? No  Pacemaker/ICD? no  Possible current pregnancy? Postmenopausal  Is the patient on methotrexate? No  Current Complaints / other details:      Cori Razor, RN 07/14/2020,11:48 AM

## 2020-07-15 ENCOUNTER — Ambulatory Visit
Admission: RE | Admit: 2020-07-15 | Discharge: 2020-07-15 | Disposition: A | Discharge status: Home / Self Care | Payer: Medicare Other | Source: Ambulatory Visit | Attending: Radiation Oncology | Admitting: Radiation Oncology

## 2020-07-15 ENCOUNTER — Other Ambulatory Visit: Payer: Self-pay

## 2020-07-15 ENCOUNTER — Encounter: Payer: Self-pay | Admitting: Radiation Oncology

## 2020-07-15 ENCOUNTER — Telehealth: Payer: Self-pay | Admitting: Hematology and Oncology

## 2020-07-15 VITALS — Ht 63.0 in | Wt 178.0 lb

## 2020-07-15 DIAGNOSIS — C50112 Malignant neoplasm of central portion of left female breast: Secondary | ICD-10-CM

## 2020-07-15 NOTE — Telephone Encounter (Signed)
Contact patient to verify mychart visit for pre reg

## 2020-07-15 NOTE — Progress Notes (Signed)
Radiation Oncology         [phone] ________________________________  Name: Taylor Blankenship        MRN: [id number]  Date of Service: 07/15/2020 DOB: 08/19/1928  CC:Shaw, William, MD  Shaw, William, MD     REFERRING PHYSICIAN: Shaw, William, MD   DIAGNOSIS: The encounter diagnosis was Malignant neoplasm of central portion of left breast in female, estrogen receptor positive (HCC).   HISTORY OF PRESENT ILLNESS: Taylor Blankenship is a 86 y.o. female seen  for a new diagnosis of left breast cancer. The patient was noted to have a palpable mass in the left breast which prompted diagnostic imaging to reewal a tubular change in the left breast and prominent lymph node in the left axilla. Ultrasound showed a mass at 6:00 measuring 8 mm in greatest dimension and there was tubular extension off of the mass difficult to measure. The lymph node was again prominent but she had a prior Covid vaccine about 6 weeks prior. 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 She underwent lumpectomy on 07/02/20 that revealed  A 1.2 cm intermediate grade DCIS and additional anterior margin specimen was clear of carcinoma.  Other margins were also clear. She is seen today on Mychart to discuss possible adjuvant therapies. She meets with Dr. Gudena tomorrow.    PREVIOUS RADIATION THERAPY: No   PAST MEDICAL HISTORY:  Past Medical History:  Diagnosis Date  . Actinic keratosis   . ASTHMA   . Asthma    no issues now  . Cancer (HCC) 06/2020   left breast DCIS  . Complication of anesthesia    hard to wake up  . Dermatophytosis of nail   . DJD (degenerative joint disease)   . History of shingles   . HYPERTENSION   . OSTEOPOROSIS   . PALPITATIONS, RECURRENT        PAST SURGICAL HISTORY: Past Surgical History:  Procedure Laterality Date  . BREAST EXCISIONAL BIOPSY Left 2004  . BREAST LUMPECTOMY WITH RADIOACTIVE SEED LOCALIZATION Left 07/02/2020   Procedure: LEFT  BREAST LUMPECTOMY WITH RADIOACTIVE SEED LOCALIZATION;  Surgeon: Cornett, Thomas, MD;  Location: [facility];  Service: General;  Laterality: Left;  . COLONOSCOPY    . IR ANGIO INTRA EXTRACRAN SEL COM CAROTID INNOMINATE BILAT MOD SED  11/24/2017  . IR ANGIO VERTEBRAL SEL VERTEBRAL BILAT MOD SED  11/24/2017     FAMILY HISTORY:  Family History  Problem Relation Age of Onset  . Arthritis Other   . Heart disease Other   . Cancer Other        colon  . Diabetes Other   . Breast cancer Maternal Aunt      SOCIAL HISTORY:  reports that she quit smoking about 51 years ago. Her smoking use included cigarettes. She has a 2.50 pack-year smoking history. She has quit using smokeless tobacco. She reports current alcohol use. She reports that she does not use drugs. The patient  lives in [place]. She enjoys being active and walks 2-3 miles per day. She is a retired OR nurse.   ALLERGIES: Amoxicillin, Cephalexin, and Doxycycline   MEDICATIONS:  Current Outpatient Medications  Medication Sig Dispense Refill  . Calcium Carbonate (CALCIUM 600) 1500 MG TABS Take by mouth daily. As directed     . Cholecalciferol (VITAMIN D) 1000 UNITS capsule Take 1,000 Units by mouth daily.      . hydrochlorothiazide (MICROZIDE) 12.5 MG capsule TAKE 1 CAPSULE BY MOUTH   EVERY DAY 100 capsule 4  . HYDROcodone-acetaminophen (NORCO/VICODIN) 5-325 MG tablet Take 1 tablet by mouth every 6 (six) hours as needed for moderate pain. 15 tablet 0  . Multiple Vitamin (MULTIVITAMIN) tablet Take 1 tablet by mouth daily.      . Omega-3 Fatty Acids (FISH OIL) 1000 MG CAPS Take by mouth.     No current facility-administered medications for this encounter.     REVIEW OF SYSTEMS: On review of systems, the patient reports that she is doing well overall. She sees Dr. Cornett Friday and denies any concerns with her incision or recovery.    PHYSICAL EXAM:  Unable to assess vitals due to encounter type. In general this is  a well appearing caucasian female in no acute distress. She's alert and oriented x4 and appropriate throughout the examination. Cardiopulmonary assessment is negative for acute distress and she exhibits normal effort. Bilateral breast exam is deferred.    ECOG = 0  0 - Asymptomatic (Fully active, able to carry on all predisease activities without restriction)  1 - Symptomatic but completely ambulatory (Restricted in physically strenuous activity but ambulatory and able to carry out work of a light or sedentary nature. For example, light housework, office work)  2 - Symptomatic, <50% in bed during the day (Ambulatory and capable of all self care but unable to carry out any work activities. Up and about more than 50% of waking hours)  3 - Symptomatic, >50% in bed, but not bedbound (Capable of only limited self-care, confined to bed or chair 50% or more of waking hours)  4 - Bedbound (Completely disabled. Cannot carry on any self-care. Totally confined to bed or chair)  5 - Death   Oken MM, Creech RH, Tormey DC, et al. (1982). "Toxicity and response criteria of the Eastern Cooperative Oncology Group". Am. J. Clin. Oncol. 5 (6): 649-55    LABORATORY DATA:  Lab Results  Component Value Date   WBC 6.5 06/29/2020   HGB 14.5 06/29/2020   HCT 43.5 06/29/2020   MCV 91.4 06/29/2020   PLT 204 06/29/2020   Lab Results  Component Value Date   NA 140 06/29/2020   K 3.9 06/29/2020   CL 105 06/29/2020   CO2 24 06/29/2020   Lab Results  Component Value Date   ALT 30 06/29/2020   AST 39 06/29/2020   ALKPHOS 65 06/29/2020   BILITOT 1.1 06/29/2020      RADIOGRAPHY: MM Breast Surgical Specimen  Result Date: 07/02/2020 CLINICAL DATA:  Left lumpectomy for recently diagnosed ductal carcinoma in situ. EXAM: SPECIMEN RADIOGRAPH OF THE LEFT BREAST COMPARISON:  Previous exam(s). FINDINGS: Status post excision of the left breast. The radioactive seed and ribbon shaped biopsy marker clip are  present, completely intact. IMPRESSION: Specimen radiograph of the left breast. Electronically Signed   By: Steven  Reid M.D.   On: 07/02/2020 10:42   US LT RADIOACTIVE SEED LOC  Result Date: 06/30/2020 CLINICAL DATA:  86-year-old female for radioactive seed localization of LEFT breast cancer prior to lumpectomy. EXAM: ULTRASOUND GUIDED RADIOACTIVE SEED LOCALIZATION OF THE LEFT BREAST DIAGNOSTIC LEFT MAMMOGRAM POST ULTRASOUND-GUIDED RADIOACTIVE SEED PLACEMENT COMPARISON:  Previous exam(s). FINDINGS: Patient presents for radioactive seed localization prior to LEFT lumpectomy. I met with the patient and we discussed the procedure of seed localization including benefits and alternatives. We discussed the high likelihood of a successful procedure. We discussed the risks of the procedure including infection, bleeding, tissue injury and further surgery. We discussed the low dose of   radioactivity involved in the procedure. Informed, written consent was given. The usual time-out protocol was performed immediately prior to the procedure. Using ultrasound guidance, sterile technique, 1% lidocaine and an I-125 radioactive seed, the mass containing biopsy clip at the 6 o'clock position of the RETROAREOLAR LEFT breast was localized using a MEDIAL approach. Follow-up survey of the patient confirms presence of the radioactive seed. Order number of I-125 seed:  [id number]. Total activity:  0.260 millicuries.  Reference Date: 06/10/2020 Mammographic images were obtained following ultrasound-guided radioactive seed placement. The mammogram images confirm the seed in the expected location and were marked for Dr. Cornett. The patient tolerated the procedure well and was released from the Breast [facility]. She was given instructions regarding seed removal. IMPRESSION: Radioactive seed localization of the LEFT breast. Appropriate location of the radioactive seed. No apparent complications. Final Assessment: Post Procedure Mammograms for  Seed Placement Electronically Signed   By: Jeffrey  Hu M.D.   On: 06/30/2020 11:27   MM CLIP PLACEMENT LEFT  Result Date: 06/30/2020 CLINICAL DATA:  86-year-old female for radioactive seed localization of LEFT breast cancer prior to lumpectomy. EXAM: ULTRASOUND GUIDED RADIOACTIVE SEED LOCALIZATION OF THE LEFT BREAST DIAGNOSTIC LEFT MAMMOGRAM POST ULTRASOUND-GUIDED RADIOACTIVE SEED PLACEMENT COMPARISON:  Previous exam(s). FINDINGS: Patient presents for radioactive seed localization prior to LEFT lumpectomy. I met with the patient and we discussed the procedure of seed localization including benefits and alternatives. We discussed the high likelihood of a successful procedure. We discussed the risks of the procedure including infection, bleeding, tissue injury and further surgery. We discussed the low dose of radioactivity involved in the procedure. Informed, written consent was given. The usual time-out protocol was performed immediately prior to the procedure. Using ultrasound guidance, sterile technique, 1% lidocaine and an I-125 radioactive seed, the mass containing biopsy clip at the 6 o'clock position of the RETROAREOLAR LEFT breast was localized using a MEDIAL approach. Follow-up survey of the patient confirms presence of the radioactive seed. Order number of I-125 seed:  [id number]. Total activity:  0.260 millicuries.  Reference Date: 06/10/2020 Mammographic images were obtained following ultrasound-guided radioactive seed placement. The mammogram images confirm the seed in the expected location and were marked for Dr. Cornett. The patient tolerated the procedure well and was released from the Breast Center. She was given instructions regarding seed removal. IMPRESSION: Radioactive seed localization of the LEFT breast. Appropriate location of the radioactive seed. No apparent complications. Final Assessment: Post Procedure Mammograms for Seed Placement Electronically Signed   By: Jeffrey  Hu M.D.   On:  06/30/2020 11:27       IMPRESSION/PLAN: 1. 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 Dr. Moody discusses the pathology findings and reviews the nature of early non invasive breast disease. Dr. Moody discusses the history and rationale of external radiotherapy to the breast  to reduce risks of local recurrence followed by antiestrogen therapy. He also discusses scenarios for which radiation can be avoided.  We discussed the risks, benefits, short, and long term effects of radiotherapy, as well as the curative intent, and the patient is not interested to proceed with radiation.. Dr. Moody discusses the delivery and logistics of radiotherapy and discusses that if she were motivated, a course of 4 weeks of radiotherapy could be considered should she change her stance on radiotherapy. She will meet with Dr. Gudena tomorrow.  This encounter was provided by telemedicine platform MyChart.  The patient has provided two factor identification and has given verbal consent for this type of   encounter and has been advised to only accept a meeting of this type in a secure network environment. The time spent during this encounter was 60 minutes including preparation, discussion, and coordination of the patient's care. The attendants for this meeting include Latoya Silva, RN, Dr. Moody, Alison Claire Perkins  and Arpi E Drzewiecki.  During the encounter,  Latoya Silva, RN, Dr. Moody, and Alison Claire Perkins were located at [facility] Radiation Oncology Department.  Taylor Blankenship was located at home.    The above documentation reflects my direct findings during this shared patient visit. Please see the separate note by Dr. Moody on this date for the remainder of the patient's plan of care.    Alison C. Perkins, PAC  

## 2020-07-15 NOTE — Progress Notes (Signed)
[place] CONSULT NOTE: [name] virtual visit I verified the patient's identity and proceeded with the consultation  Patient Care Team: Marton Redwood, MD as PCP - General (Internal Medicine)  CHIEF COMPLAINTS/PURPOSE OF CONSULTATION:  Newly diagnosed breast cancer  HISTORY OF PRESENTING ILLNESS:  Taylor Blankenship 86 y.o. female is here because of recent diagnosis of left breast DCIS. Patient palpated a left breast lump. Mammogram and [name] on 05/26/20 showed a 0.8cm mass a the 6 o'clock position. Biopsy on 06/03/20 showed ductal carcinoma in situ, grade 2, ER/PR+ >95%. She underwent a left lumpectomy on 07/02/20 for which pathology showed ductal carcinoma in situ, 1.2cm, clear margins. She presents to the clinic today for initial evaluation and discussion of treatment options.   I reviewed her records extensively and collaborated the history with the patient.  SUMMARY OF ONCOLOGIC HISTORY: Oncology History  Malignant neoplasm of central portion of left breast in female, estrogen receptor positive ([place])  06/03/2020 Cancer Staging   Staging form: Breast, AJCC 8th Edition - Clinical stage from 06/03/2020: Stage 0 (cTis (DCIS), cN0, cM0, ER+, PR+) - Signed by Gardenia Phlegm, NP on 06/10/2020   06/10/2020 Initial Diagnosis   Patient palpated a left breast lump. Mammogram showed a 0.8cm mass a the 6 o'clock position. Biopsy showed DCIS, grade 2, ER/PR+ >95%.    07/02/2020 Surgery   Left lumpectomy (Cornett): DCIS, 1.2cm, clear margins      MEDICAL HISTORY:  Past Medical History:  Diagnosis Date  . Actinic keratosis   . ASTHMA   . Asthma    no issues now  . Cancer ([place]) 06/2020   left breast DCIS  . Complication of anesthesia    hard to wake up  . Dermatophytosis of nail   . DJD (degenerative joint disease)   . History of shingles   . HYPERTENSION   . OSTEOPOROSIS   . PALPITATIONS, RECURRENT     SURGICAL HISTORY: Past Surgical History:  Procedure  Laterality Date  . BREAST EXCISIONAL BIOPSY Left 2004  . BREAST LUMPECTOMY WITH RADIOACTIVE SEED LOCALIZATION Left 07/02/2020   Procedure: LEFT BREAST LUMPECTOMY WITH RADIOACTIVE SEED LOCALIZATION;  Surgeon: Erroll Luna, MD;  Location: [facility];  Service: General;  Laterality: Left;  . COLONOSCOPY    . IR ANGIO INTRA EXTRACRAN SEL COM CAROTID INNOMINATE BILAT MOD SED  11/24/2017  . IR ANGIO VERTEBRAL SEL VERTEBRAL BILAT MOD SED  11/24/2017    SOCIAL HISTORY: Social History   Socioeconomic History  . Marital status: Married    Spouse name: Not on file  . Number of children: Not on file  . Years of education: Not on file  . Highest education level: Not on file  Occupational History  . Not on file  Tobacco Use  . Smoking status: Former Smoker    Packs/day: 0.25    Years: 10.00    Pack years: 2.50    Types: Cigarettes    Quit date: 08/01/1968    Years since quitting: 51.9  . Smokeless tobacco: Former Network engineer and Sexual Activity  . Alcohol use: Yes    Comment: social  . Drug use: No  . Sexual activity: Not Currently    Birth control/protection: Post-menopausal  Other Topics Concern  . Not on file  Social History Narrative  . Not on file   Social Determinants of Health   Financial Resource Strain: Not on file  Food Insecurity: Not on file  Transportation Needs: Not on file  Physical  Activity: Not on file  Stress: Not on file  Social Connections: Not on file  Intimate Partner Violence: Not on file    FAMILY HISTORY: Family History  Problem Relation Age of Onset  . Arthritis Other   . Heart disease Other   . Cancer Other        colon  . Diabetes Other   . Breast cancer Maternal Aunt     ALLERGIES:  is allergic to amoxicillin, cephalexin, and doxycycline.  MEDICATIONS:  Current Outpatient Medications  Medication Sig Dispense Refill  . calcium carbonate (OSCAL) 1500 (600 Ca) MG TABS tablet Take by mouth daily. As directed    .  Cholecalciferol (VITAMIN D) 1000 UNITS capsule Take 1,000 Units by mouth daily.    . hydrochlorothiazide (MICROZIDE) 12.5 MG capsule TAKE 1 CAPSULE BY MOUTH EVERY DAY 100 capsule 4  . Multiple Vitamin (MULTIVITAMIN) tablet Take 1 tablet by mouth daily.    . Omega-3 Fatty Acids (FISH OIL) 1000 MG CAPS Take by mouth.     No current facility-administered medications for this visit.    REVIEW OF SYSTEMS:   Recovered very well from recent surgery  all other systems were reviewed with the patient and are negative.  PHYSICAL EXAMINATION: ECOG PERFORMANCE STATUS: 1 - Symptomatic but completely ambulatory  There were no vitals filed for this visit. There were no vitals filed for this visit.    LABORATORY DATA:  I have reviewed the data as listed Lab Results  Component Value Date   WBC 6.5 06/29/2020   HGB 14.5 06/29/2020   HCT 43.5 06/29/2020   MCV 91.4 06/29/2020   PLT 204 06/29/2020   Lab Results  Component Value Date   NA 140 06/29/2020   K 3.9 06/29/2020   CL 105 06/29/2020   CO2 24 06/29/2020    RADIOGRAPHIC STUDIES: I have personally reviewed the radiological reports and agreed with the findings in the report.  ASSESSMENT AND PLAN:  Malignant neoplasm of central portion of left breast in female, estrogen receptor positive ([place]) June 10 2020:Patient palpated a left breast lump. Mammogram showed a 0.8cm mass a the 6 o'clock position. Biopsy showed DCIS, grade 2, ER/PR+ >95%.  07/02/2020:Left lumpectomy (Cornett): DCIS, 1.2cm, clear margins  Pathology review: I discussed with the patient the difference between DCIS and invasive breast cancer. It is considered a precancerous lesion. DCIS is classified as a 0. It is generally detected through mammograms as calcifications. We discussed the significance of grades and its impact on prognosis. We also discussed the importance of ER and PR receptors and their implications to adjuvant treatment options. Prognosis of DCIS dependence  on grade, comedo necrosis.      Tamoxifen counseling: We discussed the risks and benefits of tamoxifen.   After hearing through all the options, patient did not want to take any antiestrogen treatment. She had earlier refused radiation as well.  Return to clinic on an as-needed basis.   All questions were answered. The patient knows to call the clinic with any problems, questions or concerns.   Rulon Eisenmenger, MD, MPH 07/16/2020    I, Molly Dorshimer, am acting as scribe for Nicholas Lose, MD.  I have reviewed the above documentation for accuracy and completeness, and I agree with the above.

## 2020-07-16 ENCOUNTER — Inpatient Hospital Stay: Payer: Medicare Other | Attending: Hematology and Oncology | Admitting: Hematology and Oncology

## 2020-07-16 DIAGNOSIS — C50112 Malignant neoplasm of central portion of left female breast: Secondary | ICD-10-CM | POA: Diagnosis not present

## 2020-07-16 DIAGNOSIS — Z170 Estrogen receptor positive status [ER+]: Secondary | ICD-10-CM

## 2020-07-16 NOTE — Assessment & Plan Note (Signed)
June 10 2020:Patient palpated a left breast lump. Mammogram showed a 0.8cm mass a the 6 o'clock position. Biopsy showed DCIS, grade 2, ER/PR+ >95%.  07/02/2020:Left lumpectomy (Cornett): DCIS, 1.2cm, clear margins  Pathology review: I discussed with the patient the difference between DCIS and invasive breast cancer. It is considered a precancerous lesion. DCIS is classified as a 0. It is generally detected through mammograms as calcifications. We discussed the significance of grades and its impact on prognosis. We also discussed the importance of ER and PR receptors and their implications to adjuvant treatment options. Prognosis of DCIS dependence on grade, comedo necrosis. It is anticipated that if not treated, 20-30% of DCIS can develop into invasive breast cancer.  Recommendation: antiestrogen therapy with tamoxifen 5 years  Tamoxifen counseling: We discussed the risks and benefits of tamoxifen. These include but not limited to insomnia, hot flashes, mood changes, vaginal dryness, and weight gain. Although rare, serious side effects including endometrial cancer, risk of blood clots were also discussed. We strongly believe that the benefits far outweigh the risks. Patient understands these risks and consented to starting treatment. Planned treatment duration is 5 years.

## 2020-07-17 ENCOUNTER — Encounter: Payer: Self-pay | Admitting: *Deleted

## 2020-09-11 ENCOUNTER — Other Ambulatory Visit: Payer: Self-pay | Admitting: Internal Medicine

## 2020-09-11 DIAGNOSIS — R911 Solitary pulmonary nodule: Secondary | ICD-10-CM

## 2020-09-28 ENCOUNTER — Ambulatory Visit
Admission: RE | Admit: 2020-09-28 | Discharge: 2020-09-28 | Disposition: A | Discharge status: Home / Self Care | Payer: Medicare Other | Source: Ambulatory Visit | Attending: Internal Medicine | Admitting: Internal Medicine

## 2020-09-28 DIAGNOSIS — R911 Solitary pulmonary nodule: Secondary | ICD-10-CM

## 2020-09-29 ENCOUNTER — Other Ambulatory Visit: Payer: Self-pay | Admitting: Internal Medicine

## 2020-09-29 DIAGNOSIS — R911 Solitary pulmonary nodule: Secondary | ICD-10-CM

## 2020-09-29 NOTE — Progress Notes (Signed)
Referral made 

## 2020-10-13 ENCOUNTER — Other Ambulatory Visit: Payer: Self-pay

## 2020-10-13 ENCOUNTER — Encounter: Payer: Self-pay | Admitting: Thoracic Surgery (Cardiothoracic Vascular Surgery)

## 2020-10-13 ENCOUNTER — Institutional Professional Consult (permissible substitution) (INDEPENDENT_AMBULATORY_CARE_PROVIDER_SITE_OTHER): Payer: Medicare Other | Admitting: Thoracic Surgery (Cardiothoracic Vascular Surgery)

## 2020-10-13 VITALS — BP 128/76 | HR 75 | Resp 20 | Ht 63.0 in | Wt 180.0 lb

## 2020-10-13 DIAGNOSIS — R911 Solitary pulmonary nodule: Secondary | ICD-10-CM | POA: Diagnosis not present

## 2020-10-13 NOTE — Progress Notes (Signed)
PCP is Ginger Organ., MD Referring Provider is Tanda Rockers, MD  Chief Complaint  Patient presents with  . Lung Lesion    Surgical consult, Chest CT 09/28/20, PET Scan 12/11/19    HPI: Taylor Blankenship is sent for consultation regarding a right upper lobe lung nodule.  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  She did smoke cigarettes when she was younger, but was never a heavy smoker and quit altogether over 50 years ago.  In 2019 she presented with a headache.  She was found to have an intracranial bleed.  She was incidentally found to have a 14 mm right upper lobe groundglass opacity.  She has been followed for that with CT since that time.  In May 2021 the nodule was reported to be a little bit larger.  A PET/CT was done the SUV was 1.28.  She recently had another CT of the chest.  The official report was minimally creased in size from May 2021.  Overall she is been feeling well.  She does have some swelling in her legs.  She gets short of breath when walking up inclines but can walk up a flight of stairs.  Overall she remains independent.  She was recently diagnosed with DCIS of the breast and had a lumpectomy.  She refused any additional treatment.  Zubrod Score: At the time of surgery this patient's most appropriate activity status/level should be described as: []     0    Normal activity, no symptoms [x]     1    Restricted in physical strenuous activity but ambulatory, able to do out light work []     2    Ambulatory and capable of self care, unable to do work activities, up and about >50 % of waking hours                              []     3    Only limited self care, in bed greater than 50% of waking hours []     4    Completely disabled, no self care, confined to bed or chair []     5    Moribund  Past Medical History:   Diagnosis Date  . Actinic keratosis   . ASTHMA   . Asthma    no issues now  . Cancer ([place]) 06/2020   left breast DCIS  . Complication of anesthesia    hard to wake up  . Dermatophytosis of nail   . DJD (degenerative joint disease)   . History of shingles   . HYPERTENSION   . OSTEOPOROSIS   . PALPITATIONS, RECURRENT     Past Surgical History:  Procedure Laterality Date  . BREAST EXCISIONAL BIOPSY Left 2004  . BREAST LUMPECTOMY WITH RADIOACTIVE SEED LOCALIZATION Left 07/02/2020   Procedure: LEFT BREAST LUMPECTOMY WITH RADIOACTIVE SEED LOCALIZATION;  Surgeon: Erroll Luna, MD;  Location: [facility];  Service: General;  Laterality: Left;  . COLONOSCOPY    . IR ANGIO INTRA EXTRACRAN SEL COM CAROTID INNOMINATE BILAT MOD SED  11/24/2017  . IR ANGIO VERTEBRAL SEL VERTEBRAL BILAT MOD SED  11/24/2017    Family History  Problem Relation Age of Onset  . Arthritis Other   . Heart disease Other   . Cancer Other  colon  . Diabetes Other   . Breast cancer Maternal Aunt     Social History Social History   Tobacco Use  . Smoking status: Former Smoker    Packs/day: 0.25    Years: 10.00    Pack years: 2.50    Types: Cigarettes    Quit date: 08/01/1968    Years since quitting: 52.2  . Smokeless tobacco: Former Network engineer Use Topics  . Alcohol use: Yes    Comment: social  . Drug use: No    Current Outpatient Medications  Medication Sig Dispense Refill  . calcium carbonate (OSCAL) 1500 (600 Ca) MG TABS tablet Take by mouth daily. As directed    . Cholecalciferol (VITAMIN D) 1000 UNITS capsule Take 1,000 Units by mouth daily.    . hydrochlorothiazide (MICROZIDE) 12.5 MG capsule TAKE 1 CAPSULE BY MOUTH EVERY DAY 100 capsule 4  . Multiple Vitamin (MULTIVITAMIN) tablet Take 1 tablet by mouth daily.    . Omega-3 Fatty Acids (FISH OIL) 1000 MG CAPS Take by mouth.     No current facility-administered medications for this visit.    Allergies  Allergen  Reactions  . Amoxicillin Hives, Itching and Rash    Has patient had a PCN reaction causing immediate rash, facial/tongue/throat swelling, SOB or lightheadedness with hypotension: Yes Has patient had a PCN reaction causing severe rash involving mucus membranes or skin necrosis: No Has patient had a PCN reaction that required hospitalization: No Has patient had a PCN reaction occurring within the last 10 years: No If all of the above answers are "NO", then may proceed with Cephalosporin use.   . Cephalexin Hives, Itching and Rash  . Doxycycline Rash    Review of Systems  Constitutional: Negative for activity change, fatigue and unexpected weight change.  HENT: Positive for hearing loss. Negative for trouble swallowing and voice change.   Respiratory: Positive for shortness of breath (With exertion). Negative for cough and wheezing.   Cardiovascular: Positive for leg swelling. Negative for chest pain and palpitations.  Gastrointestinal: Negative for abdominal distention and abdominal pain.  Genitourinary: Negative for difficulty urinating.  Musculoskeletal: Positive for arthralgias.  Skin: Positive for rash.  Neurological: Negative for syncope and weakness.  Hematological: Negative for adenopathy. Bruises/bleeds easily.  All other systems reviewed and are negative.   BP 128/76   Pulse 75   Resp 20   Ht 5\' 3"  (1.6 m)   Wt 180 lb (81.6 kg)   SpO2 95% Comment: RA  BMI 31.89 kg/m  Physical Exam Vitals reviewed.  Constitutional:      General: She is not in acute distress.    Appearance: Normal appearance.  HENT:     Head: Normocephalic and atraumatic.  Eyes:     General: No scleral icterus.    Extraocular Movements: Extraocular movements intact.  Cardiovascular:     Rate and Rhythm: Normal rate and regular rhythm.     Heart sounds: Murmur (faint systolic murmur) heard.    Pulmonary:     Effort: Pulmonary effort is normal. No respiratory distress.     Breath sounds: Normal  breath sounds. No wheezing or rales.  Abdominal:     General: There is no distension.     Palpations: Abdomen is soft.  Musculoskeletal:     Cervical back: Neck supple.  Lymphadenopathy:     Cervical: No cervical adenopathy.  Skin:    General: Skin is warm and dry.  Neurological:     General: No focal deficit present.  Mental Status: She is alert and oriented to person, place, and time.     Cranial Nerves: No cranial nerve deficit.     Motor: No weakness.    Diagnostic Tests: CT CHEST WITHOUT CONTRAST  TECHNIQUE: Multidetector CT imaging of the chest was performed following the standard protocol without IV contrast.  COMPARISON:  12/02/2019 and 06/25/2018.  FINDINGS: Cardiovascular: Atherosclerotic calcification of the aorta, aortic valve and coronary arteries. Heart size normal. No pericardial effusion.  Mediastinum/Nodes: Low internal jugular, mediastinal and axillary lymph nodes are not enlarged by CT size criteria. Hilar regions are difficult to definitively evaluate without IV contrast. Esophagus is grossly unremarkable.  Lungs/Pleura: Irregular ground-glass nodule in the apical segment right upper lobe measures 1.2 x 2.2 cm (5/32), minimally increased from 1.1 x 1.8 cm on 12/02/2019 and from 1.1 x 1.6 cm on 06/25/2018. No solid component. Lungs are otherwise clear. No pleural fluid. Airway is unremarkable.  Upper Abdomen: Visualized portions the liver, adrenal glands, kidneys, spleen, pancreas, stomach and bowel are grossly unremarkable.  Musculoskeletal: Degenerative changes in the spine. Focal vague sclerosis in a midthoracic vertebral body, unchanged. No worrisome lytic or sclerotic lesions.  IMPRESSION: 1. Right upper lobe ground-glass nodule shows indolent growth from 06/25/2018 and is worrisome for low-grade adenocarcinoma. 2. Aortic atherosclerosis (ICD10-I70.0). Coronary artery calcification.   Electronically Signed   By: Lorin Picket M.D.   On: 09/28/2020 11:10  I personally reviewed the CT images.  To my measurements the nodule has not changed significantly in the past 10 months although it is slightly larger than it was in 2019.  Impression: Taylor Blankenship is an 87 year old woman with a past medical history significant for DCIS of the breast, hypertension, degenerative joint disease, osteoporosis, and 2 superficial melanomas.  She was found to have a right upper lobe groundglass opacity in 2019.  She has been followed since that time.  There has been very slow growth during that timeframe.  She did have a PET/CT almost a year ago and there was minimal activity with SUV of 1.28.  I had a long discussion with Mrs. Sturgess and her daughter.  We reviewed the images.  I did not see any significant change over the past 10 months although it has increased over the past 2 years.  We discussed the differential diagnosis.  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  Infectious and inflammatory nodules are in the differential as well.  We discussed potential options including continued radiographic follow-up, surgical excision, bronchoscopic biopsy, and stereotactic radiation.  She is not at all interested in surgical resection.  We did discuss possibility of doing a bronchoscopic biopsy and then referring for stereotactic radiation.  Another possibility would be discussed with radiation oncology whether they would treat her without a biopsy.  She currently is leaning towards not having any intervention.  We discussed that these things can sometimes change behavior and become more aggressive and there is no way we can guarantee that that will happen.  She and her daughter have a good understanding.  They will discuss things further amongst themselves.  Plan: She and her daughter will discuss her options and decide whether they want to proceed with bronchoscopic biopsy.  If she decides not to pursue any additional  interventions at this time, she will follow up with Dr. Melvyn Novas.  I will be happy to see her back if I can help in any way.   I spent over 30 minutes in review of records, images, and in  consultation with Mrs. Kimmer today.  Melrose Nakayama, MD [facility] [phone]

## 2020-10-19 ENCOUNTER — Telehealth: Payer: Self-pay | Admitting: Internal Medicine

## 2020-10-19 NOTE — Telephone Encounter (Signed)
Spoke with daughter ok per DPR  Pt and daughter requesting visit to discuss surgery referral  Appt for televisit scheduled for 10/20/20

## 2020-10-20 ENCOUNTER — Other Ambulatory Visit: Payer: Self-pay

## 2020-10-20 ENCOUNTER — Ambulatory Visit (INDEPENDENT_AMBULATORY_CARE_PROVIDER_SITE_OTHER): Payer: Medicare Other | Admitting: Internal Medicine

## 2020-10-20 ENCOUNTER — Encounter: Payer: Self-pay | Admitting: Internal Medicine

## 2020-10-20 DIAGNOSIS — R911 Solitary pulmonary nodule: Secondary | ICD-10-CM

## 2020-10-20 NOTE — Progress Notes (Signed)
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 Sherren Mocha for eval of incidental pulmonary nodule. She states she has had a cough for several years- occ prod with min clear sputum.   in her late 44's developed seasonal cough/ wheeze never completely better even on prednisone x several courses so placed on qvar which seem to  Help when uses it but is not consistent.  Used zyrtec for rashes not nasal symptoms  Rides bike at gym x 30 min  but limited from walking treadmill by R knee rec Please remember to go to the lab and x-ray department downstairs in the basement  for your tests - we will call you with the results when they are available. Add:  CT chest reminder for  06/23/18       Virtual Visit via Telephone Note 10/20/2020   I connected with Taylor Blankenship on 10/20/20 at  9:30 AM EDT by telephone and verified that I am speaking with the correct person using two identifiers. Pt is at home and this call made from my office with no other participants    I discussed the limitations, risks, security and privacy concerns of performing an evaluation and management service by telephone and the availability of in person appointments. I also discussed with the patient that there may be a patient responsible charge related to this service. The patient expressed understanding and agreed to proceed.   History of Present Illness: No longer going to gym, walking 2-3 miles per day  Min cough /never productive No wt loss    No obvious day to day or daytime variability or assoc excess/ purulent sputum or mucus plugs or  hemoptysis or cp or chest tightness, subjective wheeze or overt sinus or hb symptoms.   Also denies any obvious fluctuation of symptoms with weather or environmental changes or other aggravating or alleviating factors except as outlined above.   Meds reviewed/ med reconciliation completed     Observations/Objective: Sounds fine, no conversational dyspnea, good voice texture    Assessment and Plan: See problem list for active a/p's   Follow Up Instructions: See avs for instructions unique to this ov which includes revised/ updated med list     I discussed the assessment and treatment plan with the patient. The patient was provided an opportunity to ask questions and all were answered. The patient agreed with the plan and demonstrated an understanding of the instructions.   The patient was advised to call back or seek an in-person evaluation if the symptoms worsen or if the condition fails to improve as anticipated.  I provided 14  minutes of non-face-to-face time during this encounter.   Christinia Gully, MD          Past Medical History:  Diagnosis Date   Actinic keratosis    ASTHMA    Dermatophytosis of nail    DJD (degenerative joint disease)    History of shingles    HYPERTENSION    OSTEOPOROSIS    PALPITATIONS, RECURRENT  Assessment

## 2020-10-20 NOTE — Patient Instructions (Signed)
We will call you in August to arrange CT chest to be done around 03/28/21   Call sooner if new cough, pain on breathing on R side or unexplained weight loss.

## 2020-10-20 NOTE — Assessment & Plan Note (Signed)
See CT chest 12/21/17  = 14 mm GG changes apicoposterior RUL vs 15-16 mm on neck CT 11/24/17 >>>  in computer for recall 06/23/18 by [employer] society guidlelines  - Quant GOLD TB 02/16/2018  Neg - CT chest 06/25/2018 Apical segment right upper lobe ground-glass nodule, stable from 12/20/2017. Low-grade adenocarcinoma remains a diagnostic Possibility.  - CT chest 12/02/2019 1. Slowly enlarging ground-glass nodule in the right upper lobe now measuring 18.5 x 11.5 mm and previously measured 16 x 11.5 mm. The medial component of the lesion now has a more solid appearance measuring 7 mm. Findings suspicious for slow growing adenocarcinoma. PET-CT suggested for further evaluation.>  PET   12/11/2019 > neg for uptake  > f/u 9 months CT (daugher wants 6, pt wants 12 )    Repeat CT 09/28/2020   Right upper lobe ground-glass nodule shows indolent growth from 06/25/2018 and is worrisome for low-grade adenocarcinoma> see [facility] Hockey eval 3/145/22 she declined intervention > f/u in pulmonary clinic advised - CT chest 03/28/21 >>>  Reviewed Dr Hendrickson's notes  > pt and daughter have though thru options and just want to f/u with CT in 6 m from last study  Discussed in detail all the  indications, usual  risks and alternatives  relative to the benefits with patient who agrees to proceed with conservative f/u as outlined    Each maintenance medication was reviewed in detail including most importantly the difference between maintenance and as needed and under what circumstances the prns are to be used.  Please see AVS for specific  Instructions which are unique to this visit and I personally typed out  which were reviewed in detail over the phone with the patient and a copy provided via MyChart

## 2020-11-06 ENCOUNTER — Other Ambulatory Visit: Payer: Self-pay

## 2020-11-06 ENCOUNTER — Ambulatory Visit: Payer: Medicare Other | Attending: Internal Medicine

## 2020-11-06 DIAGNOSIS — Z23 Encounter for immunization: Secondary | ICD-10-CM

## 2020-11-06 NOTE — Progress Notes (Signed)
   Covid-19 Vaccination Clinic  Name:  Taylor Blankenship    MRN: [id number] DOB: 1929/04/16  11/06/2020  Ms. Taylor Blankenship was observed post Covid-19 immunization for 15 minutes without incident. She was provided with Vaccine Information Sheet and instruction to access the V-Safe system.   Ms. Taylor Blankenship was instructed to call 911 with any severe reactions post vaccine: Marland Kitchen Difficulty breathing  . Swelling of face and throat  . A fast heartbeat  . A bad rash all over body  . Dizziness and weakness   Immunizations Administered    Name Date Dose VIS Date Route   PFIZER Comrnaty(Gray TOP) Covid-19 Vaccine 11/06/2020  9:02 AM 0.3 mL 07/09/2020 Intramuscular   Manufacturer: [place]   Lot: [id number]   [place]: [phone]

## 2020-11-12 ENCOUNTER — Other Ambulatory Visit (HOSPITAL_BASED_OUTPATIENT_CLINIC_OR_DEPARTMENT_OTHER): Payer: Self-pay

## 2020-11-12 MED ORDER — COVID-19 MRNA VACCINE (PFIZER) 30 MCG/0.3ML IM SUSP
INTRAMUSCULAR | 0 refills | Status: DC
  Filled 2020-11-12: qty 0.3, 1d supply, fill #0

## 2020-11-13 ENCOUNTER — Other Ambulatory Visit (HOSPITAL_BASED_OUTPATIENT_CLINIC_OR_DEPARTMENT_OTHER): Payer: Self-pay

## 2021-03-24 ENCOUNTER — Telehealth: Payer: Self-pay | Admitting: Internal Medicine

## 2021-03-24 DIAGNOSIS — R911 Solitary pulmonary nodule: Secondary | ICD-10-CM

## 2021-03-24 NOTE — Telephone Encounter (Signed)
Order has been placed. Called and spoke with patient's daughter. Advised her that she will be receiving a call to get CT scheduled.  Nothing further needed at this time.

## 2021-04-10 ENCOUNTER — Ambulatory Visit
Admission: RE | Admit: 2021-04-10 | Discharge: 2021-04-10 | Disposition: A | Discharge status: Home / Self Care | Payer: Medicare Other | Source: Ambulatory Visit | Attending: Internal Medicine | Admitting: Internal Medicine

## 2021-04-10 DIAGNOSIS — R911 Solitary pulmonary nodule: Secondary | ICD-10-CM

## 2021-04-15 ENCOUNTER — Telehealth: Payer: Self-pay

## 2021-04-15 NOTE — Telephone Encounter (Signed)
-----   Message from Garner Nash, DO sent at 04/15/2021  9:35 AM EDT ----- Regarding: appt nodule Please schedule with me for nodule consult.  Can you schedule sometime this week.  Ok to schedule as a double book for 9AM or 1:30pm  Thanks  Garner Nash, DO [facility] 04/15/2021 9:35 AM

## 2021-04-15 NOTE — Telephone Encounter (Signed)
Dr. Valeta Harms did you mean you want her scheduled with you for next week? Like 19, 21, 22?

## 2021-04-15 NOTE — Telephone Encounter (Signed)
Call made to patient, spoke with patient daughter [name]), appt scheduled for 9/22.   Nothing further needed at this time.

## 2021-04-19 ENCOUNTER — Other Ambulatory Visit (HOSPITAL_BASED_OUTPATIENT_CLINIC_OR_DEPARTMENT_OTHER): Payer: Self-pay

## 2021-04-21 ENCOUNTER — Ambulatory Visit: Payer: Medicare Other | Attending: Internal Medicine

## 2021-04-21 ENCOUNTER — Other Ambulatory Visit (HOSPITAL_BASED_OUTPATIENT_CLINIC_OR_DEPARTMENT_OTHER): Payer: Self-pay

## 2021-04-21 DIAGNOSIS — Z23 Encounter for immunization: Secondary | ICD-10-CM

## 2021-04-21 MED ORDER — PFIZER COVID-19 VAC BIVALENT 30 MCG/0.3ML IM SUSP
INTRAMUSCULAR | 0 refills | Status: DC
Start: 2021-04-21 — End: 2022-02-24
  Filled 2021-04-21: qty 0.3, 1d supply, fill #0

## 2021-04-21 NOTE — Progress Notes (Signed)
   Covid-19 Vaccination Clinic  Name:  Taylor Blankenship    MRN: [id number] DOB: 1928/11/19  04/21/2021  [name] was observed post Covid-19 immunization for 15 minutes without incident. She was provided with Vaccine Information Sheet and instruction to access the V-Safe system.   [name] was instructed to call 911 with any severe reactions post vaccine: Difficulty breathing  Swelling of face and throat  A fast heartbeat  A bad rash all over body  Dizziness and weakness

## 2021-04-22 ENCOUNTER — Ambulatory Visit (INDEPENDENT_AMBULATORY_CARE_PROVIDER_SITE_OTHER): Payer: Medicare Other | Admitting: Pulmonary Disease

## 2021-04-22 ENCOUNTER — Other Ambulatory Visit: Payer: Self-pay

## 2021-04-22 ENCOUNTER — Encounter: Payer: Self-pay | Admitting: Pulmonary Disease

## 2021-04-22 VITALS — BP 116/64 | HR 66 | Ht 63.0 in | Wt 175.4 lb

## 2021-04-22 DIAGNOSIS — R911 Solitary pulmonary nodule: Secondary | ICD-10-CM | POA: Diagnosis not present

## 2021-04-22 DIAGNOSIS — R918 Other nonspecific abnormal finding of lung field: Secondary | ICD-10-CM

## 2021-04-22 DIAGNOSIS — Z853 Personal history of malignant neoplasm of breast: Secondary | ICD-10-CM | POA: Diagnosis not present

## 2021-04-22 DIAGNOSIS — Z85820 Personal history of malignant melanoma of skin: Secondary | ICD-10-CM | POA: Diagnosis not present

## 2021-04-22 NOTE — Progress Notes (Signed)
Synopsis: Referred in September 2022 for lung nodule by Ginger Organ., MD  Subjective:   PATIENT ID: Taylor Blankenship GENDER: female DOB: 04-27-1929, MRN: [id number]  Chief Complaint  Patient presents with   Consult    Referred by MW for lung nodule. First discovered in 2019. Denies any symptoms.     This is a 87 year old female, history of breast cancer status post removal, melanoma status post removal, had a CVA, bleeding event brought to the hospital had CT imaging that was completed which revealed an incidental right upper lobe groundglass nodule.  She had subsequent follow-up of this nodule after serial images which has shown a minimal amount of progression over time.  They have been following this conservatively due to her age.  We discussed the images today in the office as an probability of malignancy.  From a respiratory standpoint she has no complaints and able to complete all of her activities of daily living.  She is hard of hearing however.  She is a retired Scientist, research (life sciences).   Past Medical History:  Diagnosis Date   Actinic keratosis    ASTHMA    Asthma    no issues now   Cancer ([place]) 06/2020   left breast DCIS   Complication of anesthesia    hard to wake up   Dermatophytosis of nail    DJD (degenerative joint disease)    History of shingles    HYPERTENSION    OSTEOPOROSIS    PALPITATIONS, RECURRENT      Family History  Problem Relation Age of Onset   Arthritis Other    Heart disease Other    Cancer Other        colon   Diabetes Other    Breast cancer Maternal Aunt      Past Surgical History:  Procedure Laterality Date   BREAST EXCISIONAL BIOPSY Left 2004   BREAST LUMPECTOMY WITH RADIOACTIVE SEED LOCALIZATION Left 07/02/2020   Procedure: LEFT BREAST LUMPECTOMY WITH RADIOACTIVE SEED LOCALIZATION;  Surgeon: Erroll Luna, MD;  Location: [facility];  Service: General;  Laterality: Left;   COLONOSCOPY     IR ANGIO INTRA EXTRACRAN  SEL COM CAROTID INNOMINATE BILAT MOD SED  11/24/2017   IR ANGIO VERTEBRAL SEL VERTEBRAL BILAT MOD SED  11/24/2017    Social History   Socioeconomic History   Marital status: Married    Spouse name: Not on file   Number of children: Not on file   Years of education: Not on file   Highest education level: Not on file  Occupational History   Not on file  Tobacco Use   Smoking status: Former    Packs/day: 0.25    Years: 10.00    Pack years: 2.50    Types: Cigarettes    Quit date: 08/01/1968    Years since quitting: 52.7   Smokeless tobacco: Former  Substance and Sexual Activity   Alcohol use: Yes    Comment: social   Drug use: No   Sexual activity: Not Currently    Birth control/protection: Post-menopausal  Other Topics Concern   Not on file  Social History Narrative   Not on file   Social Determinants of Health   Financial Resource Strain: Not on file  Food Insecurity: Not on file  Transportation Needs: Not on file  Physical Activity: Not on file  Stress: Not on file  Social Connections: Not on file  Intimate Partner Violence: Not on file  Allergies  Allergen Reactions   Amoxicillin Hives, Itching and Rash    Has patient had a PCN reaction causing immediate rash, facial/tongue/throat swelling, SOB or lightheadedness with hypotension: Yes Has patient had a PCN reaction causing severe rash involving mucus membranes or skin necrosis: No Has patient had a PCN reaction that required hospitalization: No Has patient had a PCN reaction occurring within the last 10 years: No If all of the above answers are "NO", then may proceed with Cephalosporin use.    Cephalexin Hives, Itching and Rash   Doxycycline Rash     Outpatient Medications Prior to Visit  Medication Sig Dispense Refill   calcium carbonate (OSCAL) 1500 (600 Ca) MG TABS tablet Take by mouth daily. As directed     Cholecalciferol (VITAMIN D) 1000 UNITS capsule Take 1,000 Units by mouth daily.     COVID-19  mRNA bivalent vaccine, Pfizer, (PFIZER COVID-19 VAC BIVALENT) injection Inject into the muscle. 0.3 mL 0   COVID-19 mRNA vaccine, Pfizer, 30 MCG/0.3ML injection Inject into the muscle. 0.3 mL 0   hydrochlorothiazide (MICROZIDE) 12.5 MG capsule TAKE 1 CAPSULE BY MOUTH EVERY DAY 100 capsule 4   Multiple Vitamin (MULTIVITAMIN) tablet Take 1 tablet by mouth daily.     Omega-3 Fatty Acids (FISH OIL) 1000 MG CAPS Take by mouth.     No facility-administered medications prior to visit.    Review of Systems  Constitutional:  Negative for chills, fever, malaise/fatigue and weight loss.  HENT:  Positive for hearing loss. Negative for sore throat and tinnitus.   Eyes:  Negative for blurred vision and double vision.  Respiratory:  Negative for cough, hemoptysis, sputum production, shortness of breath, wheezing and stridor.   Cardiovascular:  Negative for chest pain, palpitations, orthopnea, leg swelling and PND.  Gastrointestinal:  Negative for abdominal pain, constipation, diarrhea, heartburn, nausea and vomiting.  Genitourinary:  Negative for dysuria, hematuria and urgency.  Musculoskeletal:  Negative for joint pain and myalgias.  Skin:  Negative for itching and rash.  Neurological:  Negative for dizziness, tingling, weakness and headaches.  Endo/Heme/Allergies:  Negative for environmental allergies. Does not bruise/bleed easily.  Psychiatric/Behavioral:  Negative for depression. The patient is not nervous/anxious and does not have insomnia.   All other systems reviewed and are negative.   Objective:  Physical Exam Vitals reviewed.  Constitutional:      General: She is not in acute distress.    Appearance: She is well-developed.  HENT:     Head: Normocephalic and atraumatic.  Eyes:     General: No scleral icterus.    Conjunctiva/sclera: Conjunctivae normal.     Pupils: Pupils are equal, round, and reactive to light.  Neck:     Vascular: No JVD.     Trachea: No tracheal deviation.   Cardiovascular:     Rate and Rhythm: Normal rate and regular rhythm.     Heart sounds: Murmur heard.  Pulmonary:     Effort: Pulmonary effort is normal. No tachypnea, accessory muscle usage or respiratory distress.     Breath sounds: No stridor. No wheezing, rhonchi or rales.  Abdominal:     General: Bowel sounds are normal. There is no distension.     Palpations: Abdomen is soft.     Tenderness: There is no abdominal tenderness.  Musculoskeletal:        General: No tenderness.     Cervical back: Neck supple.     Comments: Thoracic kyphosis  Lymphadenopathy:     Cervical: No cervical adenopathy.  Skin:    General: Skin is warm and dry.     Capillary Refill: Capillary refill takes less than 2 seconds.     Findings: No rash.  Neurological:     Mental Status: She is alert and oriented to person, place, and time.  Psychiatric:        Behavior: Behavior normal.     Vitals:   04/22/21 0905  BP: 116/64  Pulse: 66  SpO2: 97%  Weight: 175 lb 6.4 oz (79.6 kg)  Height: 5\' 3"  (1.6 m)   97% on RA BMI Readings from Last 3 Encounters:  04/22/21 31.07 kg/m  10/13/20 31.89 kg/m  07/15/20 31.53 kg/m   Wt Readings from Last 3 Encounters:  04/22/21 175 lb 6.4 oz (79.6 kg)  10/13/20 180 lb (81.6 kg)  07/15/20 178 lb (80.7 kg)     CBC    Component Value Date/Time   WBC 6.5 06/29/2020 0945   RBC 4.76 06/29/2020 0945   HGB 14.5 06/29/2020 0945   HCT 43.5 06/29/2020 0945   PLT 204 06/29/2020 0945   MCV 91.4 06/29/2020 0945   MCH 30.5 06/29/2020 0945   MCHC 33.3 06/29/2020 0945   RDW 12.8 06/29/2020 0945   LYMPHSABS 2.1 06/29/2020 0945   MONOABS 0.6 06/29/2020 0945   EOSABS 0.4 06/29/2020 0945   BASOSABS 0.1 06/29/2020 0945     Chest Imaging: 04/10/2021 CT scan of the chest: Patient has a mixed subsolid groundglass nodule in the central portion of the right upper lobe the largest cross-section at 2.2 cm. We discussed imaging today in the office and compared to previous  imaging on the potential likelihood of this being a stage I malignancy. The patient's images have been independently reviewed by me.    Pulmonary Functions Testing Results: No flowsheet data found.  FeNO:   Pathology:   Echocardiogram:   Heart Catheterization:     Assessment & Plan:     ICD-10-CM   1. Lung nodule  R91.1 Ambulatory referral to Radiation Oncology    2. Ground glass opacity present on imaging of lung  R91.8     3. History of breast cancer  Z85.3     4. History of melanoma  Z85.820       Discussion:  This is a 87 year old female, history of breast cancer, history of melanoma.  She has a persistent slow-growing groundglass opacity in the right upper lobe this is likely a well differentiated pulmonary adenocarcinoma by imaging.  We discussed the utility of biopsying.  Patient is very concerned about going off to sleep as she has had difficulty waking up from procedures.  This has been been weighing heavily on her mind.  Plan: She would prefer to undergo SBRT treatments with radiation if at all possible to avoid having general anesthesia. I do think that she would tolerate biopsy of the lung if was absolutely necessary. We will send her over to our radiation oncology colleagues to discuss empiric treatment with SBRT. She does have a history of breast cancer which was low-grade as well as removal of melanoma.  But the lesion within the chest I do not think morphologically fits metastatic disease of either of these primaries. We are likely dealing with a primary low-grade lung malignancy. We appreciate radiation oncology's thoughts on empiric treatment. If there is a decision made that biopsy is necessary I am happy to take for tissue sampling if needed. Extensive amount of time today in the office discussing the pros and cons of  next steps.    Current Outpatient Medications:    calcium carbonate (OSCAL) 1500 (600 Ca) MG TABS tablet, Take by mouth daily. As  directed, Disp: , Rfl:    Cholecalciferol (VITAMIN D) 1000 UNITS capsule, Take 1,000 Units by mouth daily., Disp: , Rfl:    COVID-19 mRNA bivalent vaccine, Pfizer, (PFIZER COVID-19 VAC BIVALENT) injection, Inject into the muscle., Disp: 0.3 mL, Rfl: 0   COVID-19 mRNA vaccine, Pfizer, 30 MCG/0.3ML injection, Inject into the muscle., Disp: 0.3 mL, Rfl: 0   hydrochlorothiazide (MICROZIDE) 12.5 MG capsule, TAKE 1 CAPSULE BY MOUTH EVERY DAY, Disp: 100 capsule, Rfl: 4   Multiple Vitamin (MULTIVITAMIN) tablet, Take 1 tablet by mouth daily., Disp: , Rfl:    Omega-3 Fatty Acids (FISH OIL) 1000 MG CAPS, Take by mouth., Disp: , Rfl:   I spent 63 minutes dedicated to the care of this patient on the date of this encounter to include pre-visit review of records, face-to-face time with the patient discussing conditions above, post visit ordering of testing, clinical documentation with the electronic health record, making appropriate referrals as documented, and communicating necessary findings to members of the patients care team.   Garner Nash, DO [facility] 04/22/2021 10:09 AM

## 2021-04-22 NOTE — Patient Instructions (Signed)
Thank you for visiting Dr. Valeta Harms at [facility]. Today we recommend the following:  Orders Placed This Encounter  Procedures   Ambulatory referral to Radiation Oncology   Return if symptoms worsen or fail to improve.    Please do your part to reduce the spread of COVID-19.

## 2021-04-26 ENCOUNTER — Ambulatory Visit
Admission: RE | Admit: 2021-04-26 | Discharge: 2021-04-26 | Disposition: A | Discharge status: Home / Self Care | Payer: Medicare Other | Source: Ambulatory Visit | Attending: Radiation Oncology | Admitting: Radiation Oncology

## 2021-04-26 ENCOUNTER — Other Ambulatory Visit: Payer: Self-pay

## 2021-04-26 ENCOUNTER — Encounter: Payer: Self-pay | Admitting: Radiation Oncology

## 2021-04-26 VITALS — BP 150/55 | HR 69 | Temp 97.2°F | Resp 18 | Wt 175.0 lb

## 2021-04-26 DIAGNOSIS — C3411 Malignant neoplasm of upper lobe, right bronchus or lung: Secondary | ICD-10-CM

## 2021-04-26 DIAGNOSIS — C50112 Malignant neoplasm of central portion of left female breast: Secondary | ICD-10-CM

## 2021-04-26 DIAGNOSIS — Z170 Estrogen receptor positive status [ER+]: Secondary | ICD-10-CM

## 2021-04-26 NOTE — Progress Notes (Addendum)
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 Malignant neoplasm of central portion of left breast in female, estrogen receptor positive ([place])  C50.112    Z17.0       HISTORY OF PRESENT ILLNESS: Taylor Blankenship is a 87 y.o. female seen at the request of Dr. Valeta Blankenship for a persistent enlarging subsolid RUL pulmonary nodule concerning for NSCLC. 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 She declined adjuvant radiation and antiestrogen therapy. She however has been followed for a RUL lung nodule that was first noted measuring 16 mm in 2019 on CT neck during a workup for a headache. Since, she's had routine imaging and in 2021 this measured 18 mm in greatest dimension by CT. A PET scan on 12/19/20 measured this at 14 mm with an SUV of 1.28, and blood pool was an SUV of 2.96. Repeat CT on 09/28/20 measured the area at 22 mm. Recent CT on 04/10/21 showed persistence of this measuring 22 mm again with mixed solid and ground class changes.     She's met with cardiothoracic surgery as well and it was felt that her risks of surgery would outweigh the benefits. She's seen today to discuss treatment with definitive stereotactic body radiotherapy (SBRT).  PREVIOUS RADIATION THERAPY: No  PAST MEDICAL HISTORY:  Past Medical History:  Diagnosis Date   Actinic keratosis    ASTHMA    Asthma    no issues now   Breast cancer ([place]) 07/2020   Cancer ([place]) 06/2020   left breast DCIS   Complication of anesthesia    hard  to wake up   Dermatophytosis of nail    DJD (degenerative joint disease)    History of shingles    HYPERTENSION    OSTEOPOROSIS    PALPITATIONS, RECURRENT       PAST SURGICAL HISTORY: Past Surgical History:  Procedure Laterality Date   BREAST EXCISIONAL BIOPSY Left 2004   BREAST LUMPECTOMY WITH RADIOACTIVE SEED LOCALIZATION Left 07/02/2020   Procedure: LEFT BREAST LUMPECTOMY WITH RADIOACTIVE SEED LOCALIZATION;  Surgeon: Taylor Luna, MD;  Location: [place];  Service: General;  Laterality: Left;   COLONOSCOPY     IR ANGIO INTRA EXTRACRAN SEL COM CAROTID INNOMINATE BILAT MOD SED  11/24/2017   IR ANGIO VERTEBRAL SEL VERTEBRAL BILAT MOD SED  11/24/2017    FAMILY HISTORY:  Family History  Problem Relation Age of Onset   Arthritis Other    Heart disease Other    Cancer Other        colon   Diabetes Other    Breast cancer Maternal Aunt     SOCIAL HISTORY:  Social History   Socioeconomic History   Marital status: Married    Spouse name: Not on file   Number of children: Not on file   Years of education: Not on file   Highest education level: Not on file  Occupational History   Not on file  Tobacco Use   Smoking status:  Former    Packs/day: 0.25    Years: 10.00    Pack years: 2.50    Types: Cigarettes    Quit date: 08/01/1968    Years since quitting: 52.7   Smokeless tobacco: Former  Substance and Sexual Activity   Alcohol use: Yes    Comment: social   Drug use: No   Sexual activity: Not Currently    Birth control/protection: Post-menopausal  Other Topics Concern   Not on file  Social History Narrative   Not on file   Social Determinants of Health   Financial Resource Strain: Not on file  Food Insecurity: Not on file  Transportation Needs: Not on file  Physical Activity: Not on file  Stress: Not on file  Social Connections: Not on file  Intimate Partner Violence: Not on file  The patient is retired from nursing. She is accompanied by her  daughter Taylor Blankenship.   ALLERGIES: Amoxicillin, Cephalexin, and Doxycycline  MEDICATIONS:  Current Outpatient Medications  Medication Sig Dispense Refill   calcium carbonate (OSCAL) 1500 (600 Ca) MG TABS tablet Take by mouth daily. As directed     Cholecalciferol (VITAMIN D) 1000 UNITS capsule Take 1,000 Units by mouth daily.     COVID-19 mRNA bivalent vaccine, Pfizer, (PFIZER COVID-19 VAC BIVALENT) injection Inject into the muscle. 0.3 mL 0   COVID-19 mRNA vaccine, Pfizer, 30 MCG/0.3ML injection Inject into the muscle. 0.3 mL 0   hydrochlorothiazide (MICROZIDE) 12.5 MG capsule TAKE 1 CAPSULE BY MOUTH EVERY DAY 100 capsule 4   Multiple Vitamin (MULTIVITAMIN) tablet Take 1 tablet by mouth daily.     Omega-3 Fatty Acids (FISH OIL) 1000 MG CAPS Take by mouth.     triamcinolone cream (KENALOG) 0.1 % Apply 1 application topically 2 (two) times daily.     No current facility-administered medications for this encounter.    REVIEW OF SYSTEMS:  On review of systems, the patient reports that she is doing well. She denies any cough, chest pain, shortness of breath at rest, but occasionally notes shortness of breath with exertion. She continues to walk regularly for exercise. She denies any pain. No other complaints are verbalized.     PHYSICAL EXAM:  Wt Readings from Last 3 Encounters:  04/26/21 175 lb (79.4 kg)  04/22/21 175 lb 6.4 oz (79.6 kg)  10/13/20 180 lb (81.6 kg)   Temp Readings from Last 3 Encounters:  04/26/21 (!) 97.2 F (36.2 C)  07/02/20 97.8 F (36.6 C)  04/23/18 97.7 F (36.5 C) (Oral)   BP Readings from Last 3 Encounters:  04/26/21 (!) 150/55  04/22/21 116/64  10/13/20 128/76   Pulse Readings from Last 3 Encounters:  04/26/21 69  04/22/21 66  10/13/20 75   Pain Assessment Pain Score: 0-No pain/10 In general this is a well appearing caucasian in no acute distress. She's alert and oriented x4 and appropriate throughout the examination. Cardiopulmonary assessment is  negative for acute distress and she exhibits normal effort.     KPS = 100  100 - Normal; no complaints; no evidence of disease. 90   - Able to carry on normal activity; minor signs or symptoms of disease. 80   - Normal activity with effort; some signs or symptoms of disease. 54   - Cares for self; unable to carry on normal activity or to do active work. 60   - Requires occasional assistance, but is able to care for most of his personal needs. 50   - Requires considerable assistance and frequent  medical care. 19   - Disabled; requires special care and assistance. 43   - Severely disabled; hospital admission is indicated although death not imminent. 45   - Very sick; hospital admission necessary; active supportive treatment necessary. 10   - Moribund; fatal processes progressing rapidly. 0     - Dead  Karnofsky DA, Abelmann [place], Craver LS and Burchenal [facility] [phone]) The use of the nitrogen mustards in the palliative treatment of carcinoma: with particular reference to bronchogenic carcinoma Cancer 1 634-56  LABORATORY DATA:  Lab Results  Component Value Date   WBC 6.5 06/29/2020   HGB 14.5 06/29/2020   HCT 43.5 06/29/2020   MCV 91.4 06/29/2020   PLT 204 06/29/2020   Lab Results  Component Value Date   NA 140 06/29/2020   K 3.9 06/29/2020   CL 105 06/29/2020   CO2 24 06/29/2020   Lab Results  Component Value Date   ALT 30 06/29/2020   AST 39 06/29/2020   ALKPHOS 65 06/29/2020   BILITOT 1.1 06/29/2020     RADIOGRAPHY: CT Chest Wo Contrast  Result Date: 04/12/2021 CLINICAL DATA:  Follow-up right upper lobe pulmonary nodule, concerning for indolent adenocarcinoma EXAM: CT CHEST WITHOUT CONTRAST TECHNIQUE: Multidetector CT imaging of the chest was performed following the standard protocol without IV contrast. COMPARISON:  09/28/2020 FINDINGS: Cardiovascular: Aortic atherosclerosis. Normal heart size. Scattered left coronary artery calcifications. No pericardial effusion.  Mediastinum/Nodes: No enlarged mediastinal, hilar, or axillary lymph nodes. Thyroid gland, trachea, and esophagus demonstrate no significant findings. Lungs/Pleura: No significant interval change in a mixed solid and ground-glass nodule of the central right upper lobe measuring 2.2 x 1.1 cm (series 5, image 34). Internal solid-appearing component measures approximately 0.9 cm (series 5, image 32). No pleural effusion or pneumothorax. Upper Abdomen: No acute abnormality. Musculoskeletal: No chest wall mass or suspicious bone lesions identified. Surgical clips in the left breast. IMPRESSION: 1. No significant interval change in a mixed solid and ground-glass nodule of the central right upper lobe measuring 2.2 x 1.1 cm. Internal solid-appearing component measures approximately 0.9 cm. This nodule remains highly concerning for minimally invasive adenocarcinoma. 2.  Coronary artery disease. Aortic Atherosclerosis (ICD10-I70.0). Electronically Signed   By: Eddie Candle M.D.   On: 04/12/2021 12:12      IMPRESSION/PLAN: 1. 87 y.o. woman with a nodule concerning for a  putative Stage IA3, cT1cN0M0, NSCLC of the RUL. We discussed the patient's imaging findings, prior CTs and last year's PET scan as well. Her imaging suggests a slowly growing subsolid well differentiated adenocarcinoma or bronchoalveolar carcinoma - early stage lung cancer. She is aware that to definitively diagnose her presumed malignant lung cancer, she would need a biopsy. However given the persistence and slow changes over time, we feel that these pathognomic findings confirm the diagnosis.  However, we would suggest this be characterized by PET scan to rule out metastatic adenopathy or distant disease, while reassessing the metabolism of the primary tumor site. She is aware of the risks of proceeding with therapy without tissue diagnosis. However, considering the risks of anesthesia and recent trouble she has had with anesthesia. We would offer a  definitive course of stereotactic body radiotherapy (SBRT) if repeat PET does show avidity in the lesion.  We discussed the risks, benefits, short, and long term effects of radiotherapy, as well as the curative intent, and the patient is interested in proceeding. We anticipate 3-5 fractions weeks of radiotherapy to the RUL if we were to proceed with treatment.  She is in agreement with our plans for repeat PET scan, and subsequently will discuss next steps of SBRT if her PET scan shows hypermetabolism in the RUL nodule.   2. 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 The patient continues to follow with Dr. Brantley Stage and will see Dr. Lindi Adie as needed as she decided to forgo antiestrogen therapy.   We personally spent a total of 75 minutes in this encounter including chart review, reviewing radiological studies, meeting face-to-face with the patient, entering orders and completing documentation.     Carola Rhine, PAC  and   Tyler Pita, MD [facility] Direct Dial: [phone]  Fax: [phone] [email or web address]  Skype  [email or web address]

## 2021-04-26 NOTE — Progress Notes (Signed)
Thoracic Location of Tumor / Histology: RUL Lung  On recent imaging the nodule in her right lung has been under surveillance since 2019 when it was noted to be 16 mm.  Nodule is now noted to be 22 mm on imaging on 09/28/2020  Signs/Symptoms Weight changes, if any: Pretty stable Respiratory complaints, if any: Reports occasional SOB with increased activities.  She is still able to walk daily.  Hemoptysis, if any: Denies coughing and hemoptysis. Pain issues, if any:  no  SAFETY ISSUES: Prior radiation? Declined XRT Pacemaker/ICD? No  Possible current pregnancy? Postmenopausal Is the patient on methotrexate? No  Current Complaints / other details:   History of Left Breast Cancer, declined XRT and antiestrogen therapy, Left Lumpectomy.

## 2021-04-27 DIAGNOSIS — C3411 Malignant neoplasm of upper lobe, right bronchus or lung: Secondary | ICD-10-CM | POA: Insufficient documentation

## 2021-04-29 ENCOUNTER — Telehealth: Payer: Self-pay | Admitting: *Deleted

## 2021-04-29 NOTE — Telephone Encounter (Signed)
Called patient's daughterKatharine Blankenship to inform of Pet Scan for 05-13-21- arrival time- 11:30 am , pt. to be NPO- 6 hrs. prior to test, patient can have water only, spoke with patient's daughterKatharine Blankenship and she verified understanding this scan

## 2021-05-13 ENCOUNTER — Other Ambulatory Visit: Payer: Self-pay

## 2021-05-13 ENCOUNTER — Ambulatory Visit (HOSPITAL_COMMUNITY)
Admission: RE | Admit: 2021-05-13 | Discharge: 2021-05-13 | Disposition: A | Discharge status: Home / Self Care | Payer: Medicare Other | Source: Ambulatory Visit | Attending: Radiation Oncology | Admitting: Radiation Oncology

## 2021-05-13 DIAGNOSIS — K5730 Diverticulosis of large intestine without perforation or abscess without bleeding: Secondary | ICD-10-CM | POA: Insufficient documentation

## 2021-05-13 DIAGNOSIS — C50112 Malignant neoplasm of central portion of left female breast: Secondary | ICD-10-CM | POA: Diagnosis not present

## 2021-05-13 DIAGNOSIS — R911 Solitary pulmonary nodule: Secondary | ICD-10-CM | POA: Diagnosis not present

## 2021-05-13 DIAGNOSIS — Z170 Estrogen receptor positive status [ER+]: Secondary | ICD-10-CM | POA: Insufficient documentation

## 2021-05-13 DIAGNOSIS — I700 Atherosclerosis of aorta: Secondary | ICD-10-CM | POA: Insufficient documentation

## 2021-05-13 LAB — GLUCOSE, CAPILLARY: Glucose-Capillary: 89 mg/dL (ref 70–99)

## 2021-05-13 MED ORDER — FLUDEOXYGLUCOSE F - 18 (FDG) INJECTION
8.7000 | Freq: Once | INTRAVENOUS | Status: AC | PRN
  Administered 2021-05-13: 8.7 via INTRAVENOUS

## 2021-05-18 ENCOUNTER — Telehealth: Payer: Self-pay | Admitting: Radiation Oncology

## 2021-05-18 NOTE — Telephone Encounter (Signed)
I called and spoke with the patient's daughter to update her on Dr. Johny Shears review of the PET scan, she did have a by [employer] vaccine given approximately 2-1/2 weeks ago which corresponds to the finding in the left chest wall retropectoral node.  Dr. Tammi Klippel is still in favor of treating the nodule in the right upper lobe with SBRT.  New orders will be placed for simulation and they will receive a phone call to coordinate this.

## 2021-06-02 ENCOUNTER — Ambulatory Visit: Payer: Medicare Other | Admitting: Radiation Oncology

## 2021-06-07 ENCOUNTER — Ambulatory Visit
Admission: RE | Admit: 2021-06-07 | Discharge: 2021-06-07 | Disposition: A | Discharge status: Home / Self Care | Payer: Medicare Other | Source: Ambulatory Visit | Attending: Radiation Oncology | Admitting: Radiation Oncology

## 2021-06-07 DIAGNOSIS — C50112 Malignant neoplasm of central portion of left female breast: Secondary | ICD-10-CM | POA: Insufficient documentation

## 2021-06-07 DIAGNOSIS — Z170 Estrogen receptor positive status [ER+]: Secondary | ICD-10-CM | POA: Insufficient documentation

## 2021-06-07 DIAGNOSIS — C3411 Malignant neoplasm of upper lobe, right bronchus or lung: Secondary | ICD-10-CM

## 2021-06-07 NOTE — Progress Notes (Signed)
  Radiation Oncology         [phone] ________________________________  Name: Taylor Blankenship MRN: [id number]  Date: 06/07/2021  DOB: 08/01/1929  STEREOTACTIC BODY RADIOTHERAPY SIMULATION AND TREATMENT PLANNING NOTE    ICD-10-CM   1. 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  Identity was confirmed.  All relevant records and images related to the planned course of therapy were reviewed.  The patient freely provided informed written consent to proceed with treatment after reviewing the details related to the planned course of therapy. The consent form was witnessed and verified by the simulation staff.  Then, the patient was set-up in a stable reproducible  supine position for radiation therapy.  A BodyFix immobilization pillow was fabricated for reproducible positioning.  Then I personally applied the abdominal compression paddle to limit respiratory excursion.  4D respiratoy motion management CT images were obtained.  Surface markings were placed.  The CT images were loaded into the planning software.  Then, using Cine, MIP, and standard views, the internal target volume (ITV) and planning target volumes (PTV) were delinieated, and avoidance structures were contoured.  Treatment planning then occurred.  The radiation prescription was entered and confirmed.  A total of two complex treatment devices were fabricated in the form of the BodyFix immobilization pillow and a neck accuform cushion.  I have requested : 3D Simulation  I have requested a DVH of the following structures: Heart, Lungs, Esophagus, Chest Wall, Brachial Plexus, Major Blood Vessels, and targets.  SPECIAL TREATMENT PROCEDURE:  The planned course of therapy using radiation constitutes a special  treatment procedure. Special care is required in the management of this patient for the following reasons. This treatment constitutes a Special Treatment Procedure for the following reason: [ High dose per fraction requiring special monitoring for increased toxicities of treatment including daily imaging..  The special nature of the planned course of radiotherapy will require increased physician supervision and oversight to ensure patient's safety with optimal treatment outcomes.  This requires extended time and effort.    RESPIRATORY MOTION MANAGEMENT SIMULATION:  In order to account for effect of respiratory motion on target structures and other organs in the planning and delivery of radiotherapy, this patient underwent respiratory motion management simulation.  To accomplish this, when the patient was brought to the CT simulation planning suite, 4D respiratoy motion management CT images were obtained.  The CT images were loaded into the planning software.  Then, using a variety of tools including Cine, MIP, and standard views, the target volume and planning target volumes (PTV) were delineated.  Avoidance structures were contoured.  Treatment planning then occurred.  Dose volume histograms were generated and reviewed for each of the requested structure.  The resulting plan was carefully reviewed and approved today.  PLAN:  The patient will receive 54 Gy in 3 fractions.  ________________________________  Sheral Apley Tammi Klippel, M.D.

## 2021-06-08 DIAGNOSIS — C50112 Malignant neoplasm of central portion of left female breast: Secondary | ICD-10-CM | POA: Diagnosis not present

## 2021-06-14 ENCOUNTER — Ambulatory Visit: Payer: Medicare Other

## 2021-06-15 ENCOUNTER — Ambulatory Visit: Payer: Medicare Other

## 2021-06-16 ENCOUNTER — Ambulatory Visit
Admission: RE | Admit: 2021-06-16 | Discharge: 2021-06-16 | Disposition: A | Discharge status: Home / Self Care | Payer: Medicare Other | Source: Ambulatory Visit | Attending: Radiation Oncology | Admitting: Radiation Oncology

## 2021-06-16 ENCOUNTER — Other Ambulatory Visit: Payer: Self-pay

## 2021-06-16 DIAGNOSIS — C50112 Malignant neoplasm of central portion of left female breast: Secondary | ICD-10-CM | POA: Diagnosis not present

## 2021-06-18 ENCOUNTER — Other Ambulatory Visit: Payer: Self-pay

## 2021-06-18 ENCOUNTER — Ambulatory Visit
Admission: RE | Admit: 2021-06-18 | Discharge: 2021-06-18 | Disposition: A | Discharge status: Home / Self Care | Payer: Medicare Other | Source: Ambulatory Visit | Attending: Radiation Oncology | Admitting: Radiation Oncology

## 2021-06-18 DIAGNOSIS — C50112 Malignant neoplasm of central portion of left female breast: Secondary | ICD-10-CM | POA: Diagnosis not present

## 2021-06-21 ENCOUNTER — Encounter: Payer: Self-pay | Admitting: Urology

## 2021-06-21 ENCOUNTER — Ambulatory Visit: Payer: Medicare Other

## 2021-06-22 ENCOUNTER — Encounter: Payer: Self-pay | Admitting: Urology

## 2021-06-22 ENCOUNTER — Ambulatory Visit
Admission: RE | Admit: 2021-06-22 | Discharge: 2021-06-22 | Disposition: A | Discharge status: Home / Self Care | Payer: Medicare Other | Source: Ambulatory Visit | Attending: Radiation Oncology | Admitting: Radiation Oncology

## 2021-06-22 ENCOUNTER — Other Ambulatory Visit: Payer: Self-pay

## 2021-06-22 DIAGNOSIS — C50112 Malignant neoplasm of central portion of left female breast: Secondary | ICD-10-CM | POA: Diagnosis not present

## 2021-06-22 DIAGNOSIS — C3411 Malignant neoplasm of upper lobe, right bronchus or lung: Secondary | ICD-10-CM

## 2021-06-23 ENCOUNTER — Ambulatory Visit: Payer: Medicare Other

## 2021-07-28 ENCOUNTER — Encounter: Payer: Self-pay | Admitting: Urology

## 2021-07-28 NOTE — Progress Notes (Signed)
Spoke w/ patient's daughter Calisa Luckenbaugh, who is cleared to speak on patient's behalf and identified using 2 identifiers. She reports patient is experiencing some shortness of breath and fatigue. No other symptoms reported at this time.   Meaningful use complete.  Willy Eddy notified of patient's Taylor Blankenship's 8:30am-07/29/21 telephone appointment, w/ Freeman Caldron and verbalized understanding.  Patient preferred contact # [phone]

## 2021-07-29 ENCOUNTER — Ambulatory Visit
Admission: RE | Admit: 2021-07-29 | Discharge: 2021-07-29 | Disposition: A | Discharge status: Home / Self Care | Payer: Medicare Other | Source: Ambulatory Visit | Attending: Urology | Admitting: Urology

## 2021-07-29 DIAGNOSIS — C3411 Malignant neoplasm of upper lobe, right bronchus or lung: Secondary | ICD-10-CM

## 2021-07-29 NOTE — Progress Notes (Signed)
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  Volumetric arc fields were employed to deliver 6 MV X-rays.  Image guidance was performed with per fraction cone beam CT prior to treatment under personal MD supervision.  Immobilization was achieved using BodyFix Pillow.  Narrative: The patient tolerated radiation treatment relatively well and did not experience any ill effects aside from fatigue and persistent shortness of breath with exertion.  Plan: The patient has completed radiation treatment. The patient will return to radiation oncology clinic for routine followup in one month. I advised them to call or return sooner if they have any questions or concerns related to their recovery or treatment. ________________________________  Sheral Apley. Tammi Klippel, M.D.

## 2021-07-29 NOTE — Progress Notes (Signed)
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  The patient was notified in advance and gave permission to proceed with this visit format.  She tolerated radiation treatment relatively well and did not experience any ill effects aside from fatigue and persistent shortness of breath with exertion.                              On review of systems, the patient states that she is doing well in general. She continues with shortness of breath on exertion and fatigue but feels like this is gradually improving and otherwise feels well. She specifically denies productive cough, hemoptysis, chest pain or increased shortness of breath and has not had fever, chills or night sweats. She has a good appetite and is maintaining her weight. Overall, she is pleased with her progress to date.  ALLERGIES:  is allergic to amoxicillin, cephalexin, and doxycycline.  Meds: Current Outpatient Medications  Medication Sig Dispense Refill   calcium carbonate (OSCAL) 1500 (600 Ca) MG TABS tablet Take by mouth daily. As directed     Cholecalciferol (VITAMIN D) 1000 UNITS capsule Take 1,000 Units by mouth daily.     COVID-19 mRNA bivalent vaccine, Pfizer, (PFIZER COVID-19 VAC BIVALENT) injection Inject into the muscle. 0.3 mL 0   COVID-19 mRNA vaccine,  Pfizer, 30 MCG/0.3ML injection Inject into the muscle. 0.3 mL 0   hydrochlorothiazide (MICROZIDE) 12.5 MG capsule TAKE 1 CAPSULE BY MOUTH EVERY DAY 100 capsule 4   Multiple Vitamin (MULTIVITAMIN) tablet Take 1 tablet by mouth daily.     Omega-3 Fatty Acids (FISH OIL) 1000 MG CAPS Take by mouth.     triamcinolone cream (KENALOG) 0.1 % Apply 1 application topically 2 (two) times daily.     No current facility-administered medications for this encounter.    Physical Findings:  vitals were not taken for this visit.  Pain Assessment Pain Score: 0-No pain/10 Unable to assess due to telephone follow up visit format.  Lab Findings: Lab Results  Component Value Date   WBC 6.5 06/29/2020   HGB 14.5 06/29/2020   HCT 43.5 06/29/2020   MCV 91.4 06/29/2020   PLT 204 06/29/2020     Radiographic Findings: No results found.  Impression/Plan: 39. 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 She appears to have the effects of her recent SBRT and is currently without complaints.  We discussed the plan to obtain a follow-up CT chest in the next 1 to 2 weeks to assess her treatment response and I will plan to call her, via her daughter [name], with those results as soon as they are available.  Pending  that scan is stable, we will plan to proceed with serial CT chest scans every 3 to 6 months to continue to monitor for any evidence of disease progression or recurrence.  She and her daughter appear to have a good understanding of these recommendations and are comfortable and in agreement.  They know that they are welcome to call at anytime in the interim with any questions or concerns.    Nicholos Johns, PA-C

## 2021-08-10 ENCOUNTER — Other Ambulatory Visit: Payer: Self-pay

## 2021-08-10 ENCOUNTER — Ambulatory Visit (HOSPITAL_COMMUNITY)
Admission: RE | Admit: 2021-08-10 | Discharge: 2021-08-10 | Disposition: A | Discharge status: Home / Self Care | Payer: Medicare Other | Source: Ambulatory Visit | Attending: Urology | Admitting: Urology

## 2021-08-10 DIAGNOSIS — C3411 Malignant neoplasm of upper lobe, right bronchus or lung: Secondary | ICD-10-CM

## 2021-08-12 ENCOUNTER — Encounter: Payer: Self-pay | Admitting: Urology

## 2021-08-12 NOTE — Progress Notes (Signed)
Spoke w/ patient's daughter "Lorian Yaun" and verified her identity. She reports patient has a cough w/ mild shortness of breath, that is improving. No other symptoms reported at this time.  Meaningful use complete. Postmenopausal- No chances of pregnancy.  Willy Eddy notified of mother "Eshal Keidel's 1:00pm-08/13/21 telephone appointment w/ Freeman Caldron PA-C. I left my extension [phone] in case they need to call. Patient's daughter verbalized understanding of all information given.  Patient preferred contact # [phone]

## 2021-08-13 ENCOUNTER — Ambulatory Visit: Payer: Self-pay | Admitting: Urology

## 2021-08-13 ENCOUNTER — Ambulatory Visit
Admission: RE | Admit: 2021-08-13 | Discharge: 2021-08-13 | Disposition: A | Discharge status: Home / Self Care | Payer: Medicare Other | Source: Ambulatory Visit | Attending: Urology | Admitting: Urology

## 2021-08-13 DIAGNOSIS — C3411 Malignant neoplasm of upper lobe, right bronchus or lung: Secondary | ICD-10-CM

## 2021-08-13 NOTE — Progress Notes (Signed)
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  The patient was notified in advance and gave permission to proceed with this visit format.  She tolerated radiation treatment well without any ill effects aside from fatigue and persistent shortness of breath with exertion.  She had a recent posttreatment CT chest scan on 08/10/2021 which shows a stable appearance of the recently treated right upper lobe lung nodule with surrounding radiation changes and no evidence of metastatic disease.  A hypermetabolic left axillary/subpectoral lymph node, seen on prior PET imaging has decreased in size, now measuring 4 mm as compared to 8 mm previously.                          On review of systems, the patient states that she has continued doing well in general. She continues with shortness of breath on exertion and fatigue but feels like this is gradually improving and otherwise feels well. She specifically denies productive cough, hemoptysis, chest pain or increased shortness of breath and has not had fever, chills or night sweats.  She has had a dry, hacking cough over the past 3 weeks but this is gradually improving.  She has a good appetite and is maintaining her weight. Overall, she is  pleased with her progress to date.  ALLERGIES:  is allergic to amoxicillin, cephalexin, and doxycycline.  Meds: Current Outpatient Medications  Medication Sig Dispense Refill   calcium carbonate (OSCAL) 1500 (600 Ca) MG TABS tablet Take by mouth daily. As directed     Cholecalciferol (VITAMIN D) 1000 UNITS capsule Take 1,000 Units by mouth daily.     COVID-19 mRNA bivalent vaccine, Pfizer, (PFIZER COVID-19 VAC BIVALENT) injection Inject into the muscle. 0.3 mL 0   COVID-19 mRNA vaccine, Pfizer, 30 MCG/0.3ML injection Inject into the muscle. 0.3 mL 0   hydrochlorothiazide (MICROZIDE) 12.5 MG capsule TAKE 1 CAPSULE BY MOUTH EVERY DAY 100 capsule 4   Multiple Vitamin (MULTIVITAMIN) tablet Take 1 tablet by mouth daily.     Omega-3 Fatty Acids (FISH OIL) 1000 MG CAPS Take by mouth.     triamcinolone cream (KENALOG) 0.1 % Apply 1 application topically 2 (two) times daily. (Patient not taking: Reported on 08/12/2021)     No current facility-administered medications for this encounter.    Physical Findings:  vitals were not taken for this visit.  Pain Assessment Pain Score: 0-No pain/10 Unable to assess due to telephone follow up visit format.  Lab Findings: Lab Results  Component Value Date   WBC 6.5 06/29/2020   HGB 14.5 06/29/2020   HCT 43.5 06/29/2020   MCV 91.4 06/29/2020   PLT 204 06/29/2020     Radiographic Findings:  CT Chest Wo Contrast  Addendum Date: 08/10/2021   ADDENDUM REPORT: 08/10/2021 11:39 ADDENDUM: Not mentioned in the impression is decreased size of a left axillary/subpectoral lymph node which was hypermetabolic on PET-CT 3 months earlier. This could reflect treatment response or a resolving reactive node (especially since the patient was reported to have COVID vaccination in the left arm 2 weeks prior to the PET-CT). Electronically Signed   By: Richardean Sale M.D.   On: 08/10/2021 11:39   Result Date: 08/10/2021 CLINICAL DATA:  Non-small cell lung cancer. Assess  treatment response. Previous SB RT for right upper lobe non-small cell lung cancer. History of left breast cancer. EXAM: CT CHEST WITHOUT CONTRAST TECHNIQUE: Multidetector CT imaging of the chest was performed following the standard protocol without IV contrast. COMPARISON:  PET-CT 05/13/2021.  Chest CT 04/10/2021 and 09/28/2020. FINDINGS: Cardiovascular: Atherosclerosis of the aorta, great vessels and coronary arteries. The heart size is normal. There is no pericardial effusion. Mediastinum/Nodes: There are no enlarged mediastinal, hilar or axillary lymph nodes.The hypermetabolic left axillary/subpectoral lymph node on the most recent PET-CT has decreased in size, now measuring 4 mm short axis on image 29/2 (previously 8 mm). Hilar assessment is limited by the lack of intravenous contrast, although the hilar contours appear unchanged. The thyroid gland, trachea and esophagus demonstrate no significant findings. Lungs/Pleura: No pleural effusion or pneumothorax. The treated part solid right upper lobe lesion is partly obscured by surrounding radiation changes, although similar in size to previous study, measuring approximately 2.1 x 1.2 cm on image 35/5. Surrounding parenchymal opacities are somewhat nodular, but likely related to radiation therapy or inflammation. Most focal component measures 6 mm on image 29/5. No suspicious pulmonary nodularity elsewhere. Mild centrilobular emphysema. Upper abdomen: The visualized upper abdomen appears stable without significant findings. Musculoskeletal/Chest wall: There is no chest wall mass or suspicious osseous finding. Stable postsurgical changes in the left breast. Stable small sclerotic lesion in the T8 vertebral body from 12/20/2017, consistent with a benign finding. Mild thoracic spondylosis. IMPRESSION: 1. Interval right upper lobe SBRT with associated somewhat nodular opacities surrounding the dominant part solid right upper lobe lesion, attributed to the radiation  therapy or inflammation. The nodule itself appears grossly unchanged. Continued follow-up recommended. 2. No evidence of metastatic disease. 3. Aortic Atherosclerosis (ICD10-I70.0) and Emphysema (ICD10-J43.9). Electronically Signed: By: Richardean Sale M.D. On: 08/10/2021 11:12    Impression/Plan: 1. 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 She appears to have recovered well from the effects of her recent SBRT.  She has had a dry, hacking cough for the last 3 weeks but this is gradually improving. Her recent posttreatment CT chest scan shows a stable appearance of the treated nodule and no evidence of disease progression or recurrence.  We discussed the plan to proceed with serial CT chest scans every 3 to 6 months to continue to monitor for any evidence of disease progression or recurrence and I will plan to call her by telephone to review the results following each scan.  She and her daughter appear to have a good understanding of these recommendations and are comfortable and in agreement.  They know that they are welcome to call at anytime in the interim with any questions or concerns, particularly if her cough begins to worsen in any way.    Nicholos Johns, PA-C

## 2021-08-18 ENCOUNTER — Telehealth: Payer: Self-pay | Admitting: *Deleted

## 2021-08-18 NOTE — Telephone Encounter (Signed)
RETURNED PATIENT'S MOM'S PHONE CALL, SPOKE WITH PATIENT'S MOM- SANDRA Kilcrease

## 2021-09-14 ENCOUNTER — Encounter (HOSPITAL_COMMUNITY): Payer: Self-pay

## 2021-11-09 ENCOUNTER — Encounter (HOSPITAL_COMMUNITY): Payer: Self-pay

## 2021-11-09 ENCOUNTER — Ambulatory Visit (HOSPITAL_COMMUNITY)
Admission: RE | Admit: 2021-11-09 | Discharge: 2021-11-09 | Disposition: A | Discharge status: Home / Self Care | Payer: Medicare Other | Source: Ambulatory Visit | Attending: Urology | Admitting: Urology

## 2021-11-09 DIAGNOSIS — C3411 Malignant neoplasm of upper lobe, right bronchus or lung: Secondary | ICD-10-CM | POA: Diagnosis present

## 2021-11-16 ENCOUNTER — Encounter: Payer: Self-pay | Admitting: Urology

## 2021-11-16 NOTE — Progress Notes (Signed)
Telephone appointment. I spoke w/ patient's daughter "Taylor Blankenship", verified her identity, and began nursing interview. [name] reports that Ms. Taylor Blankenship is experiencing some persistent shortness of breath, wheezing, and fatigue. No other issues reported at this time. ? ?Meaningful use complete. ? ?Reminded patient's daughter of 11:00am-11/17/21 telephone appointment w/ Ashlyn Bruning PA-C. I left my extension [phone] in case patient needs anything. Understanding of information verbalized. ? ?Patient contact [phone] ?

## 2021-11-17 ENCOUNTER — Ambulatory Visit
Admission: RE | Admit: 2021-11-17 | Discharge: 2021-11-17 | Disposition: A | Discharge status: Home / Self Care | Payer: Medicare Other | Source: Ambulatory Visit | Attending: Urology | Admitting: Urology

## 2021-11-17 DIAGNOSIS — C3411 Malignant neoplasm of upper lobe, right bronchus or lung: Secondary | ICD-10-CM

## 2021-11-17 NOTE — Progress Notes (Signed)
?Radiation Oncology         [phone] ?________________________________ ? ?Name: Taylor Blankenship MRN: [id number]  ?Date: 11/17/2021  DOB: 28-Jul-1929 ? ?Post Treatment Note ? ?CC: Ginger Organ., MD  Garner Nash, DO ? 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 ?Interval Since Last Radiation:  5 months  ?06/16/21 - 06/22/21:   The target in the RUL lung was treated to 54 Gy in 3 fractions of 18 Gy ? ?Narrative: I spoke with the patient to conduct her routine scheduled follow up visit via telephone to review the results of her recent posttreatment CT Chest scan to spare the patient unnecessary potential exposure in the healthcare setting during the current COVID-19 pandemic.  The patient was notified in advance and gave permission to proceed with this visit format. ? ?She tolerated radiation treatment well without any ill effects aside from fatigue and persistent shortness of breath with exertion.  Her initial posttreatment CT chest scan on 08/10/2021 showed a stable appearance of the recently treated right upper lobe lung nodule with surrounding radiation changes and no evidence of metastatic disease.  The hypermetabolic left axillary/subpectoral lymph node, seen on prior PET imaging had decreased in size, measuring 4 mm as compared to 8 mm previously.  On her most recent CT Chest scan, there is evidence of post-treatment radiation pneumonitis with the recently treated right upper lobe nodule no longer visualized. No pleural effusion and no new or enlarging pulmonary nodules. The previously described left subpectoral lymph is stable in size, ?measuring 4 mm and there are no pathologically enlarged lymph nodes seen in the chest.                 ? ?On review of systems, the patient states that she has continued doing well in general. She continues with shortness of breath on exertion and fatigue that has not  worsened since the time of treatment and otherwise feels well. She specifically denies productive cough, hemoptysis, chest pain or increased shortness of breath and has not had fever, chills or night sweats.  She has continued with a dry, hacking cough, mainly at night, but reports that this is tolerable and does not interrupt her sleep.  She has continued with a good appetite and is maintaining her weight. Overall, she remains pleased with her progress to date. ? ?ALLERGIES:  is allergic to amoxicillin, cephalexin, and doxycycline. ? ?Meds: ?Current Outpatient Medications  ?Medication Sig Dispense Refill  ? calcium carbonate (OSCAL) 1500 (600 Ca) MG TABS tablet Take by mouth daily. As directed    ? Cholecalciferol (VITAMIN D) 1000 UNITS capsule Take 1,000 Units by mouth daily.    ? COVID-19 mRNA bivalent vaccine, Pfizer, (PFIZER COVID-19 VAC BIVALENT) injection Inject into the muscle. 0.3 mL 0  ? COVID-19 mRNA vaccine, Pfizer, 30 MCG/0.3ML injection Inject into the muscle. 0.3 mL 0  ? hydrochlorothiazide (MICROZIDE) 12.5 MG capsule TAKE 1 CAPSULE BY MOUTH EVERY DAY 100 capsule 4  ? Multiple Vitamin (MULTIVITAMIN) tablet Take 1 tablet by mouth daily.    ? Omega-3 Fatty Acids (FISH OIL) 1000 MG CAPS Take by mouth.    ? triamcinolone cream (KENALOG) 0.1 % Apply 1 application topically 2 (two) times daily. (Patient not taking: Reported on 08/12/2021)    ? ?No current facility-administered medications for this encounter.  ? ? ?Physical Findings: ? vitals were not taken for  this visit.  ?Pain Assessment ?Pain Score: 0-No pain/10 ?Unable to assess due to telephone follow up visit format. ? ?Lab Findings: ?Lab Results  ?Component Value Date  ? WBC 6.5 06/29/2020  ? HGB 14.5 06/29/2020  ? HCT 43.5 06/29/2020  ? MCV 91.4 06/29/2020  ? PLT 204 06/29/2020  ? ? ? ?Radiographic Findings: ?CT Chest Wo Contrast ? ?Result Date: 11/09/2021 ?CLINICAL DATA:  Follow-up right upper lobe non-small cell lung cancer; * Tracking Code: BO *  EXAM: CT CHEST WITHOUT CONTRAST TECHNIQUE: Multidetector CT imaging of the chest was performed following the standard protocol without IV contrast. RADIATION DOSE REDUCTION: This exam was performed according to the departmental dose-optimization program which includes automated exposure control, adjustment of the mA and/or kV according to patient size and/or use of iterative reconstruction technique. COMPARISON:  Chest CT dated August 10, 2021 FINDINGS: Cardiovascular: Normal heart size. No pericardial effusion. Mitral annular calcifications. Mild coronary artery calcifications. Atherosclerotic disease of the thoracic aorta. Mediastinum/Nodes: Esophagus and thyroid are unremarkable. Previously described left subpectoral lymph is stable in size, measuring 4 mm. No pathologically enlarged lymph nodes seen in the chest. Lungs/Pleura: Central airways are patent. Increased right upper lobe ground-glass and consolidative opacities with previously described right upper lobe nodule not visualized. No pleural effusion. No new or enlarging pulmonary nodules. Upper Abdomen: No acute abnormality. Musculoskeletal: No chest wall mass or suspicious bone lesions identified. IMPRESSION: 1. Increased right upper lobe ground-glass and consolidative opacities with previously described right upper lobe nodule not visualized, findings are likely due to post radiation pneumonitis. Attention on follow-up recommended. 2. No evidence of metastatic disease. 3. Aortic Atherosclerosis (ICD10-I70.0). Electronically Signed   By: Yetta Glassman M.D.   On: 11/09/2021 15:46   ? ?Impression/Plan: ?19. 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 ?She appears to be recovering well from the effects of her recent SBRT.  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 We discussed this  today and the recommended treatment with a steroid taper, but she declines treatment, feeling that her symptoms are stable to slightly improved and she has not had a good experience with steroids in the past. I also offered a prescription for Tessalon Perles to help with the dry cough at night but she also refused this, again, feeling that the cough is tolerable and gradually improving, not worsening. She and her daughter, [name], understand that should her sxs worsen in any way or she develop fevers/chills/night sweats, they should call immediately for consideration of treatment of the radiation pneumonitis. Otherwise, we will plan to repeat a CT Chest scan in 3 months to assess progress/resolution of the pneumonitis and pending that scan is stable-improved, we will then proceed with serial CT chest scans every 6 months to continue to monitor for any evidence of disease progression or recurrence and I will plan to call her by telephone to review the results following each scan.  She and her daughter appear to have a good understanding of these recommendations and are comfortable and in agreement.  They know that they are welcome to call at anytime in the interim with any questions or concerns, particularly if her cough begins to worsen in any way. ? ?Given current concerns for patient exposure during the COVID-19 pandemic, this encounter was conducted via telephone. The patient was notified in advance and was offered  a [place] meeting to allow for face to face communication but unfortunately reported that she did not have the appropriate resources/technology to support such a visit and instead preferred to proceed with telephone consult. ?The patient has given verbal consent for this type of encounter. The time spent during this encounter was 20 minutes, including chart review, reviewing radiological studies, telephone discussion with the patient, entering orders and completing documentation. ? ?The attendants for  this meeting include Zaccheus Edmister PA-C, patient, Karmah Potocki and her daughter, [name]. ?During the encounter, Dhanya Bogle PA-C, was located at [facility]

## 2021-11-19 ENCOUNTER — Ambulatory Visit: Payer: Medicare Other

## 2021-11-20 IMAGING — MG MM BREAST LOCALIZATION CLIP
4 series · 4 of 12 positions shown · non-contrast
Comparison: Previous exam(s).

CLINICAL DATA: 89-year-old female for radioactive seed localization
of LEFT breast cancer prior to lumpectomy.

EXAM:
ULTRASOUND GUIDED RADIOACTIVE SEED LOCALIZATION OF THE LEFT BREAST
DIAGNOSTIC LEFT MAMMOGRAM POST ULTRASOUND-GUIDED RADIOACTIVE SEED
PLACEMENT

[L CC synth-2D]
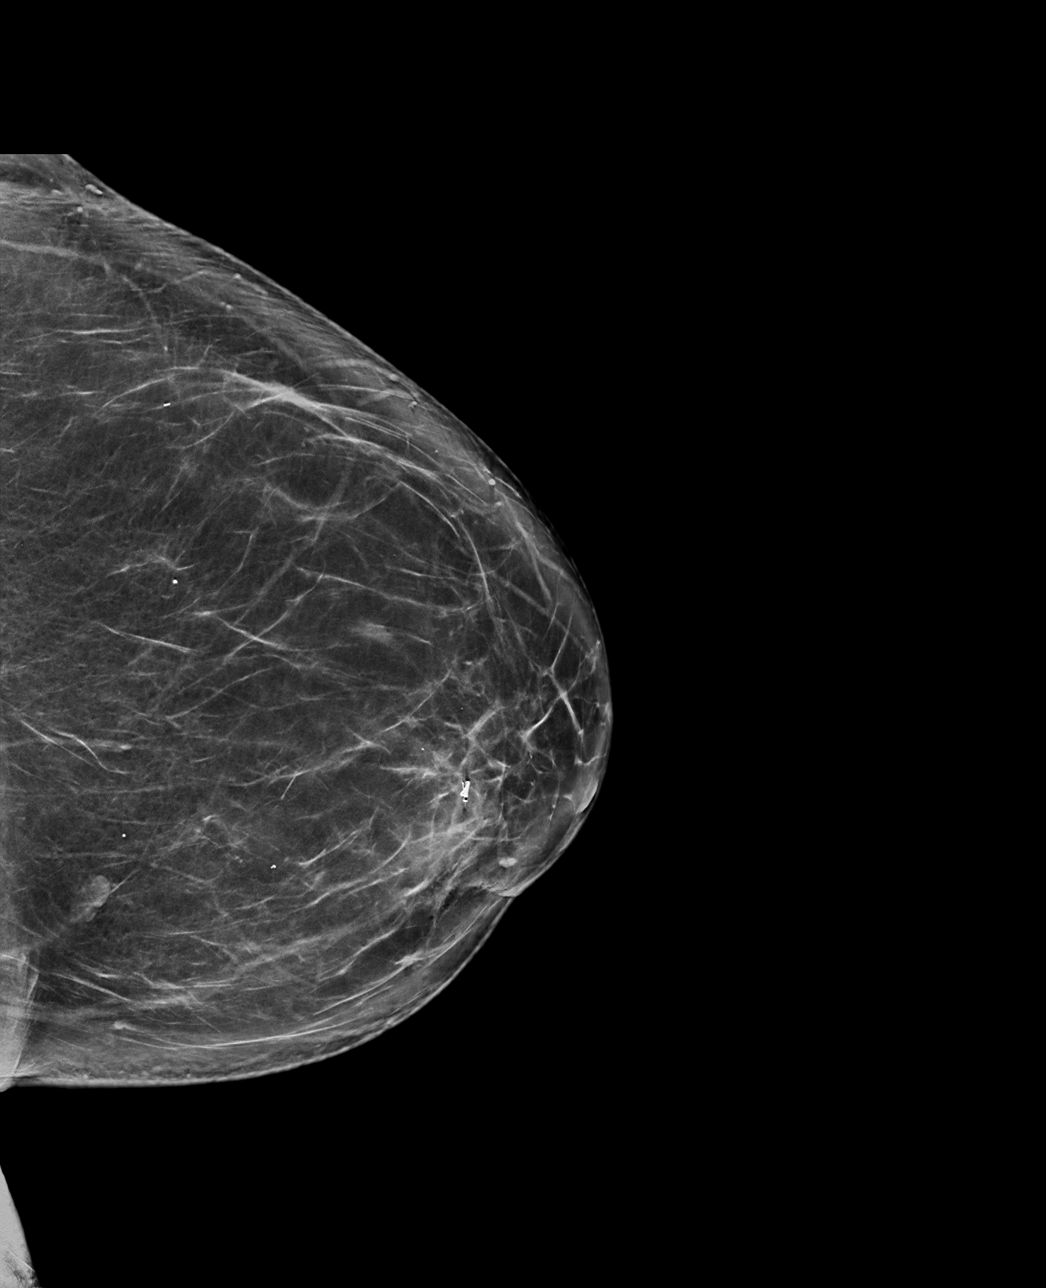

[L ML synth-2D]
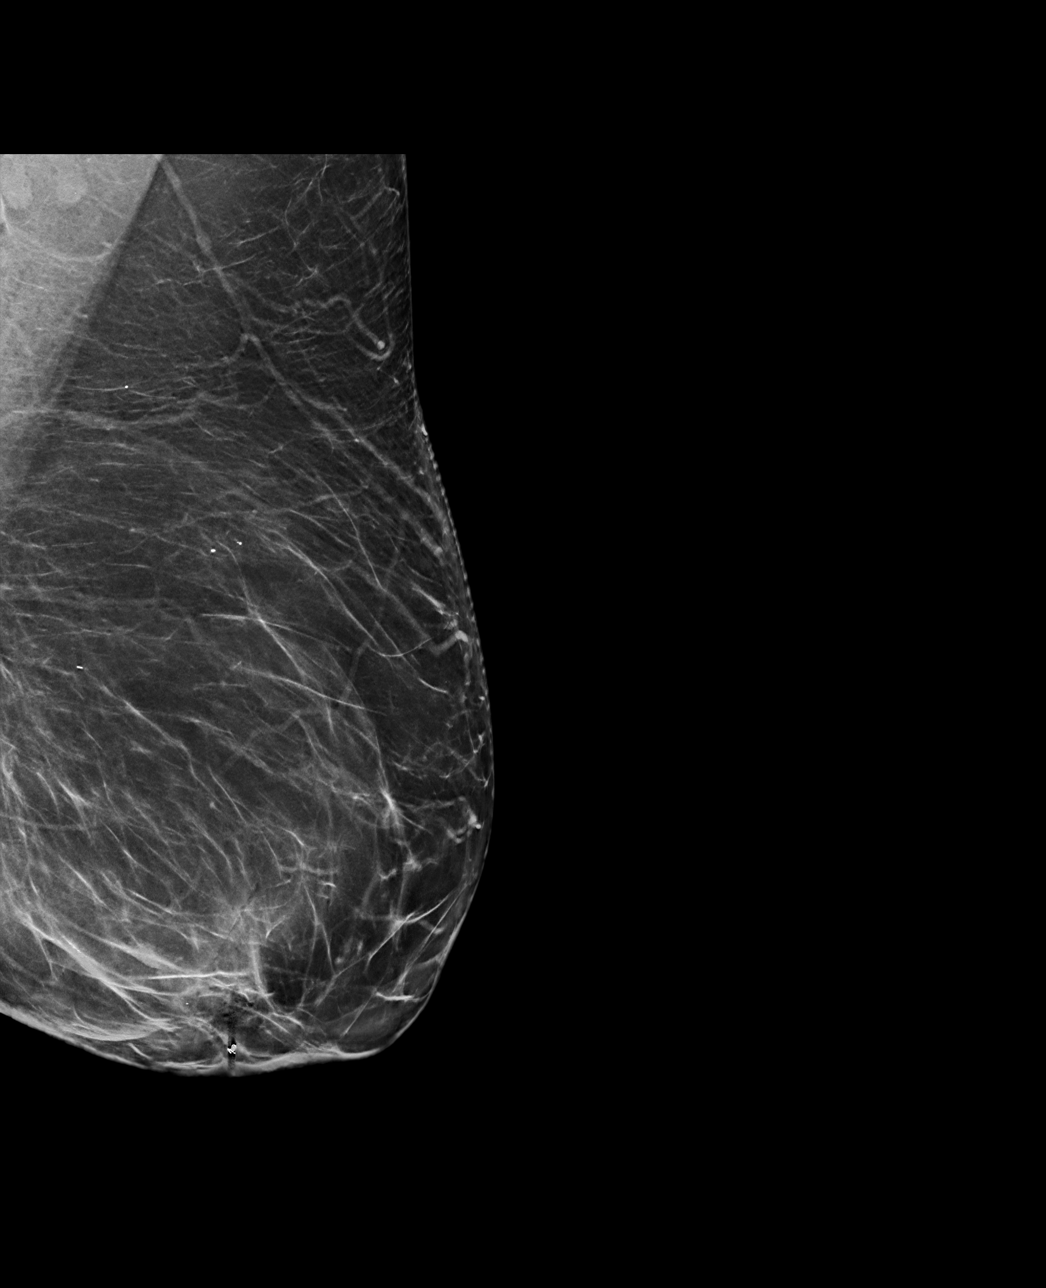

[L CC tomo · tomo slice 44/87.0]
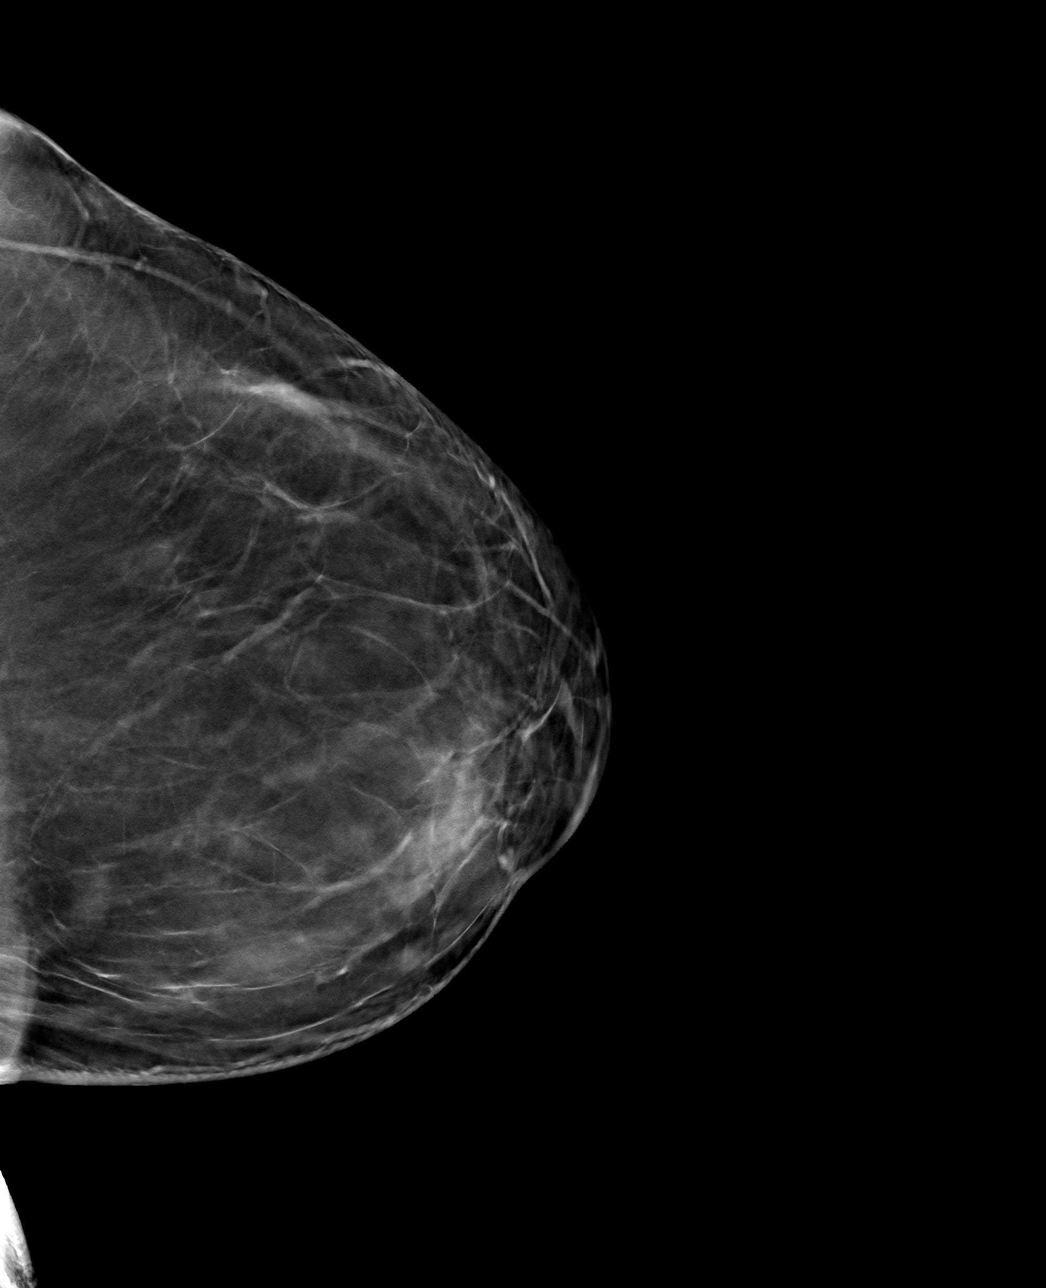

[L ML tomo · tomo slice 46/91.0]
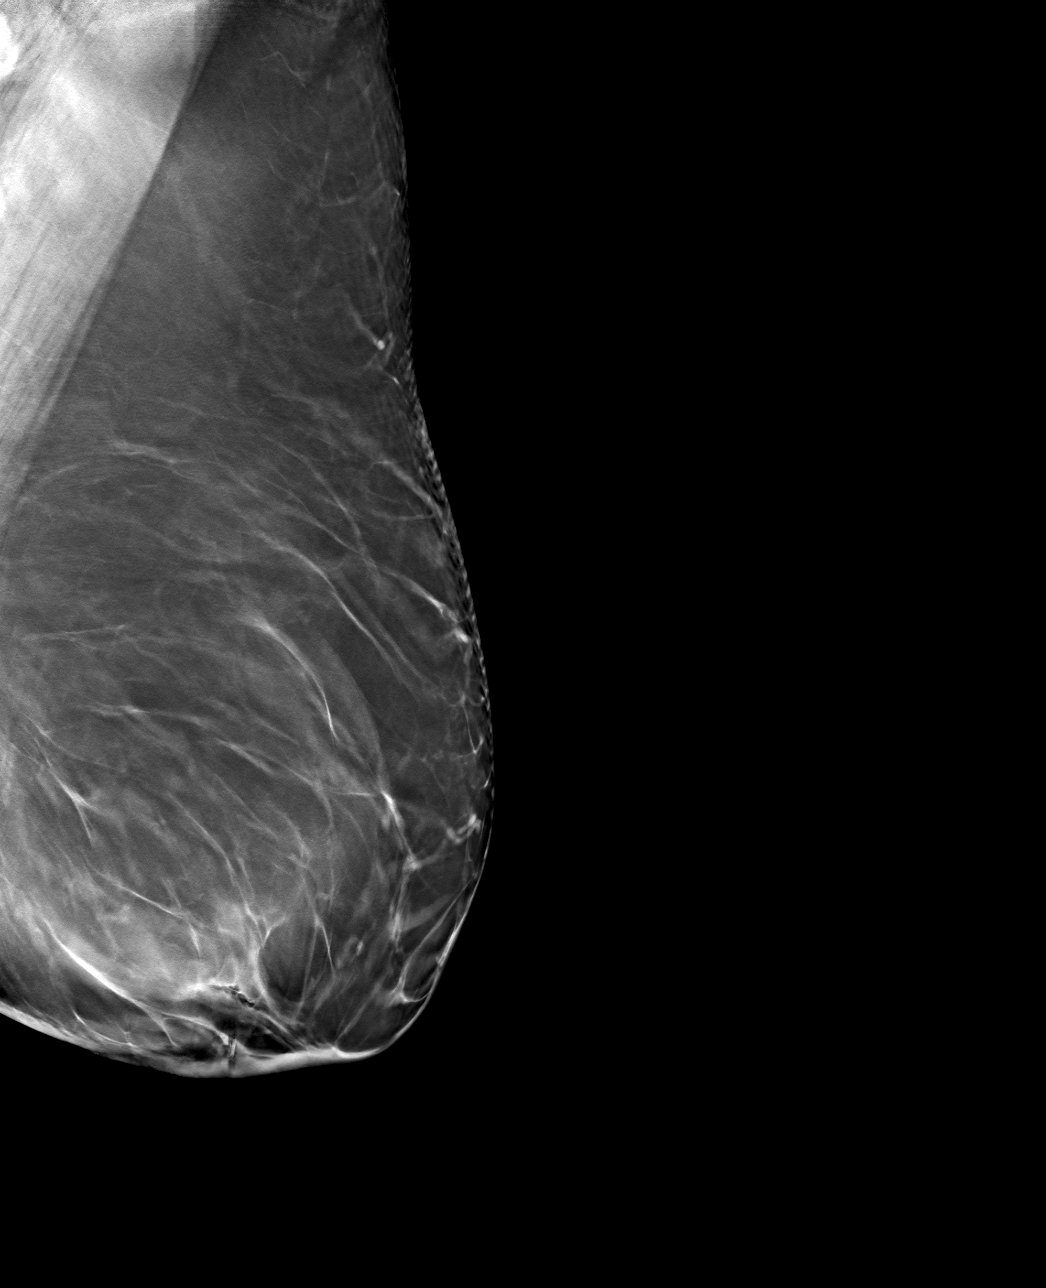

[4 of 12 positions shown; findings below may reference images not displayed]

FINDINGS: Patient presents for radioactive seed localization prior to LEFT
lumpectomy. I met with the patient and we discussed the procedure of
seed localization including benefits and alternatives. We discussed
the high likelihood of a successful procedure. We discussed the
risks of the procedure including infection, bleeding, tissue injury
and further surgery. We discussed the low dose of radioactivity
involved in the procedure. Informed, written consent was given.

The usual time-out protocol was performed immediately prior to the
procedure.

Using ultrasound guidance, sterile technique, 1% lidocaine and an
X-8T4 radioactive seed, the mass containing biopsy clip at the 6
o'clock position of the RETROAREOLAR LEFT breast was localized using
a MEDIAL approach.

Follow-up survey of the patient confirms presence of the radioactive
seed.

Order number of X-8T4 seed:  [id number].

Total activity:  0.260 millicuries.  Reference Date: 06/10/2020

Mammographic images were obtained following ultrasound-guided
radioactive seed placement. The mammogram images confirm the seed in
the expected location and were marked for Dr. Tufteland.

The patient tolerated the procedure well and was released from the
[REDACTED]. She was given instructions regarding seed removal.
IMPRESSION: Radioactive seed localization of the LEFT breast. Appropriate
location of the radioactive seed.

No apparent complications.

Final Assessment: Post Procedure Mammograms for Seed Placement

## 2021-11-20 IMAGING — US US PLC BREAST LOC DEV 1ST LESION INC US GUIDE*L*
1 series · 3 of 3 positions shown · non-contrast
Comparison: Previous exam(s).

CLINICAL DATA: 89-year-old female for radioactive seed localization
of LEFT breast cancer prior to lumpectomy.

EXAM:
ULTRASOUND GUIDED RADIOACTIVE SEED LOCALIZATION OF THE LEFT BREAST
DIAGNOSTIC LEFT MAMMOGRAM POST ULTRASOUND-GUIDED RADIOACTIVE SEED
PLACEMENT

[Series 1: us plc breast loc dev 1st lesion inc us guide*left · 0.05mm/px · 3 of 3 slices shown]
[im 1/3]
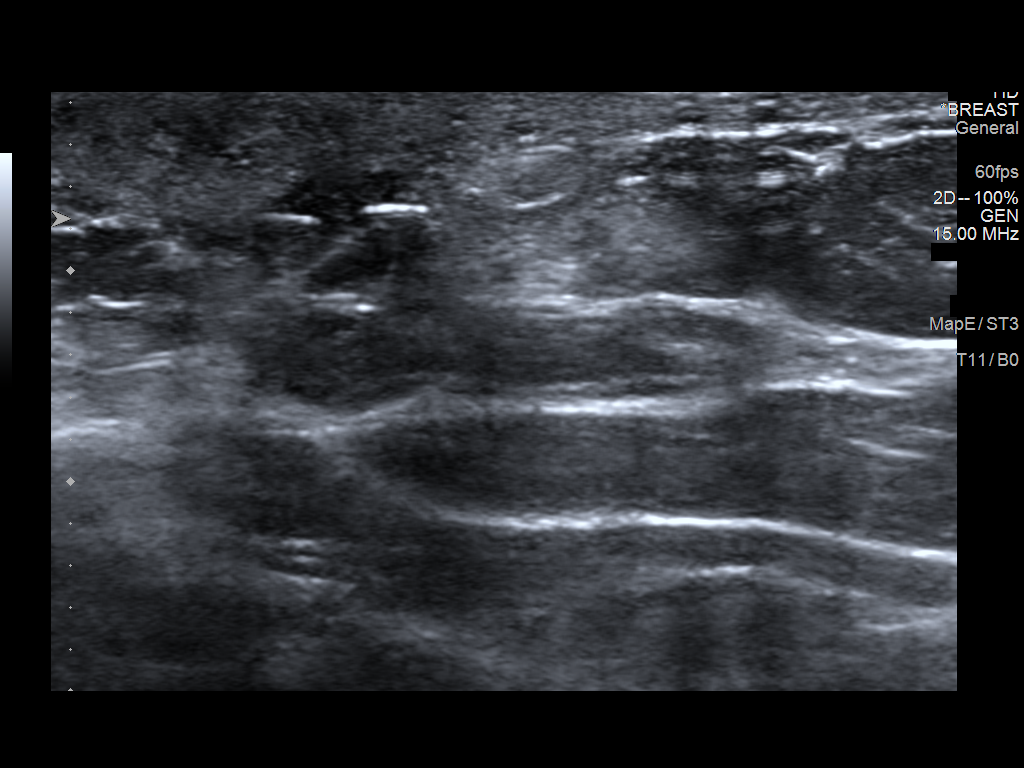
[im 2/3]
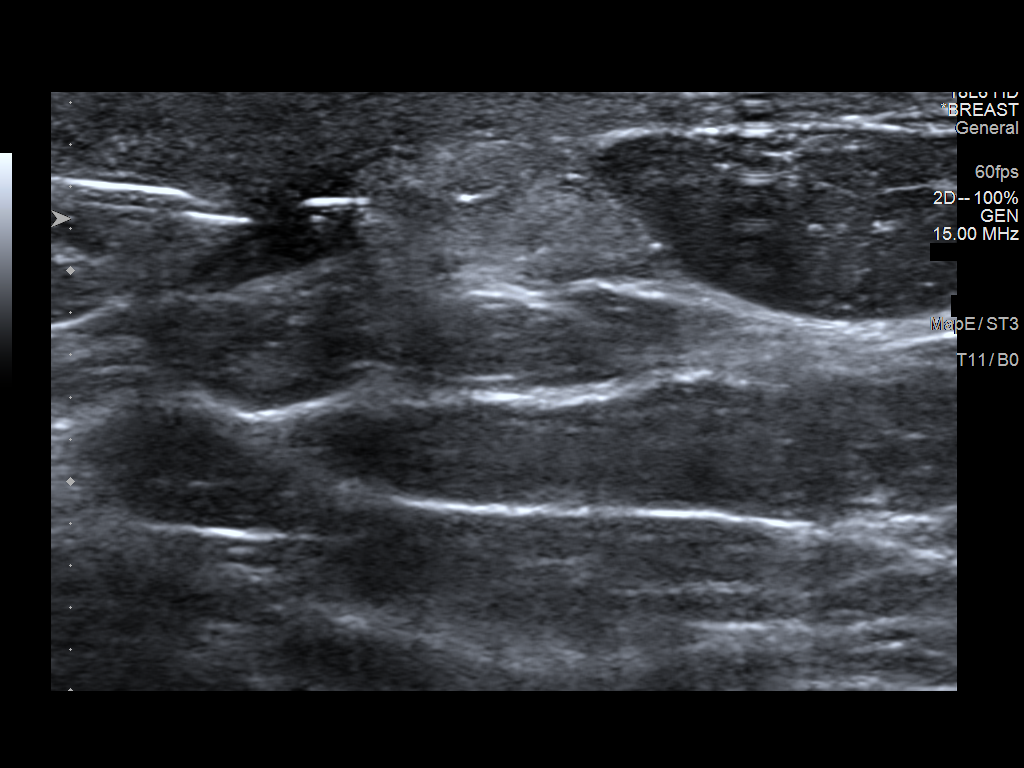
[im 3/3]
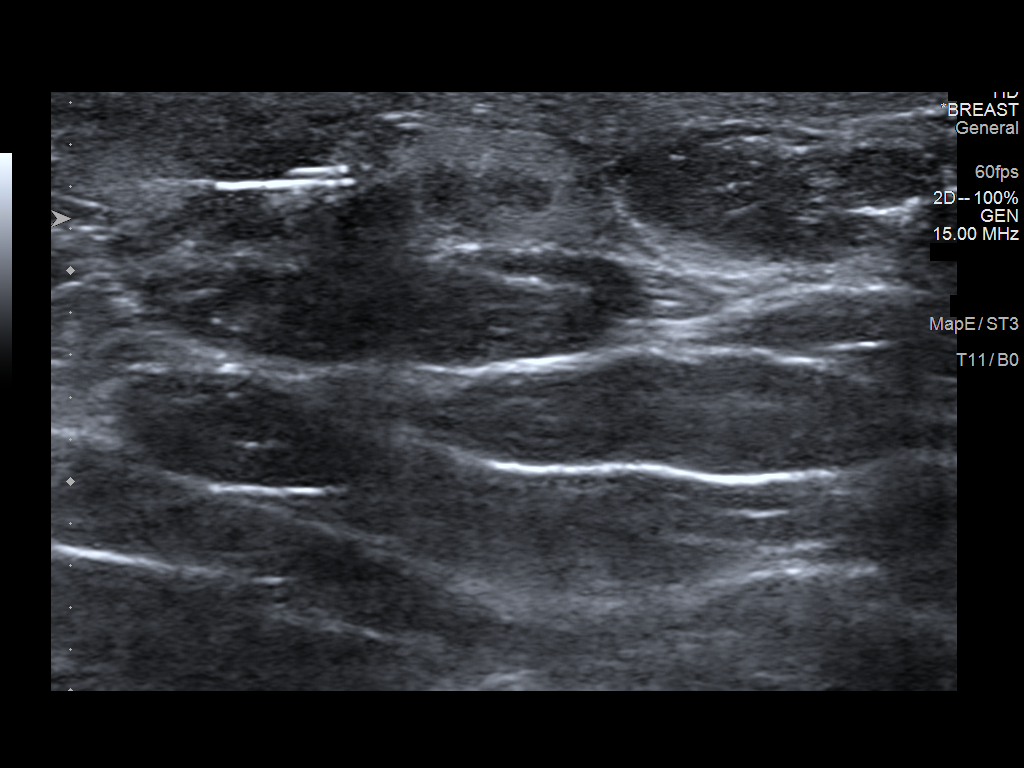

[3 of 3 positions shown; findings below may reference images not displayed]

FINDINGS: Patient presents for radioactive seed localization prior to LEFT
lumpectomy. I met with the patient and we discussed the procedure of
seed localization including benefits and alternatives. We discussed
the high likelihood of a successful procedure. We discussed the
risks of the procedure including infection, bleeding, tissue injury
and further surgery. We discussed the low dose of radioactivity
involved in the procedure. Informed, written consent was given.

The usual time-out protocol was performed immediately prior to the
procedure.

Using ultrasound guidance, sterile technique, 1% lidocaine and an
X-8T4 radioactive seed, the mass containing biopsy clip at the 6
o'clock position of the RETROAREOLAR LEFT breast was localized using
a MEDIAL approach.

Follow-up survey of the patient confirms presence of the radioactive
seed.

Order number of X-8T4 seed:  [id number].

Total activity:  0.260 millicuries.  Reference Date: 06/10/2020

Mammographic images were obtained following ultrasound-guided
radioactive seed placement. The mammogram images confirm the seed in
the expected location and were marked for Dr. Tufteland.

The patient tolerated the procedure well and was released from the
[REDACTED]. She was given instructions regarding seed removal.
IMPRESSION: Radioactive seed localization of the LEFT breast. Appropriate
location of the radioactive seed.

No apparent complications.

Final Assessment: Post Procedure Mammograms for Seed Placement

## 2021-11-22 IMAGING — MG MM BREAST SURGICAL SPECIMEN
1 series · 2 of 2 positions shown · non-contrast
Comparison: Previous exam(s).

CLINICAL DATA: Left lumpectomy for recently diagnosed ductal
carcinoma in situ.

EXAM:
SPECIMEN RADIOGRAPH OF THE LEFT BREAST

[Series 2: L · left · 0.07mm/px · 2 of 2 slices shown]
[im 1/2]
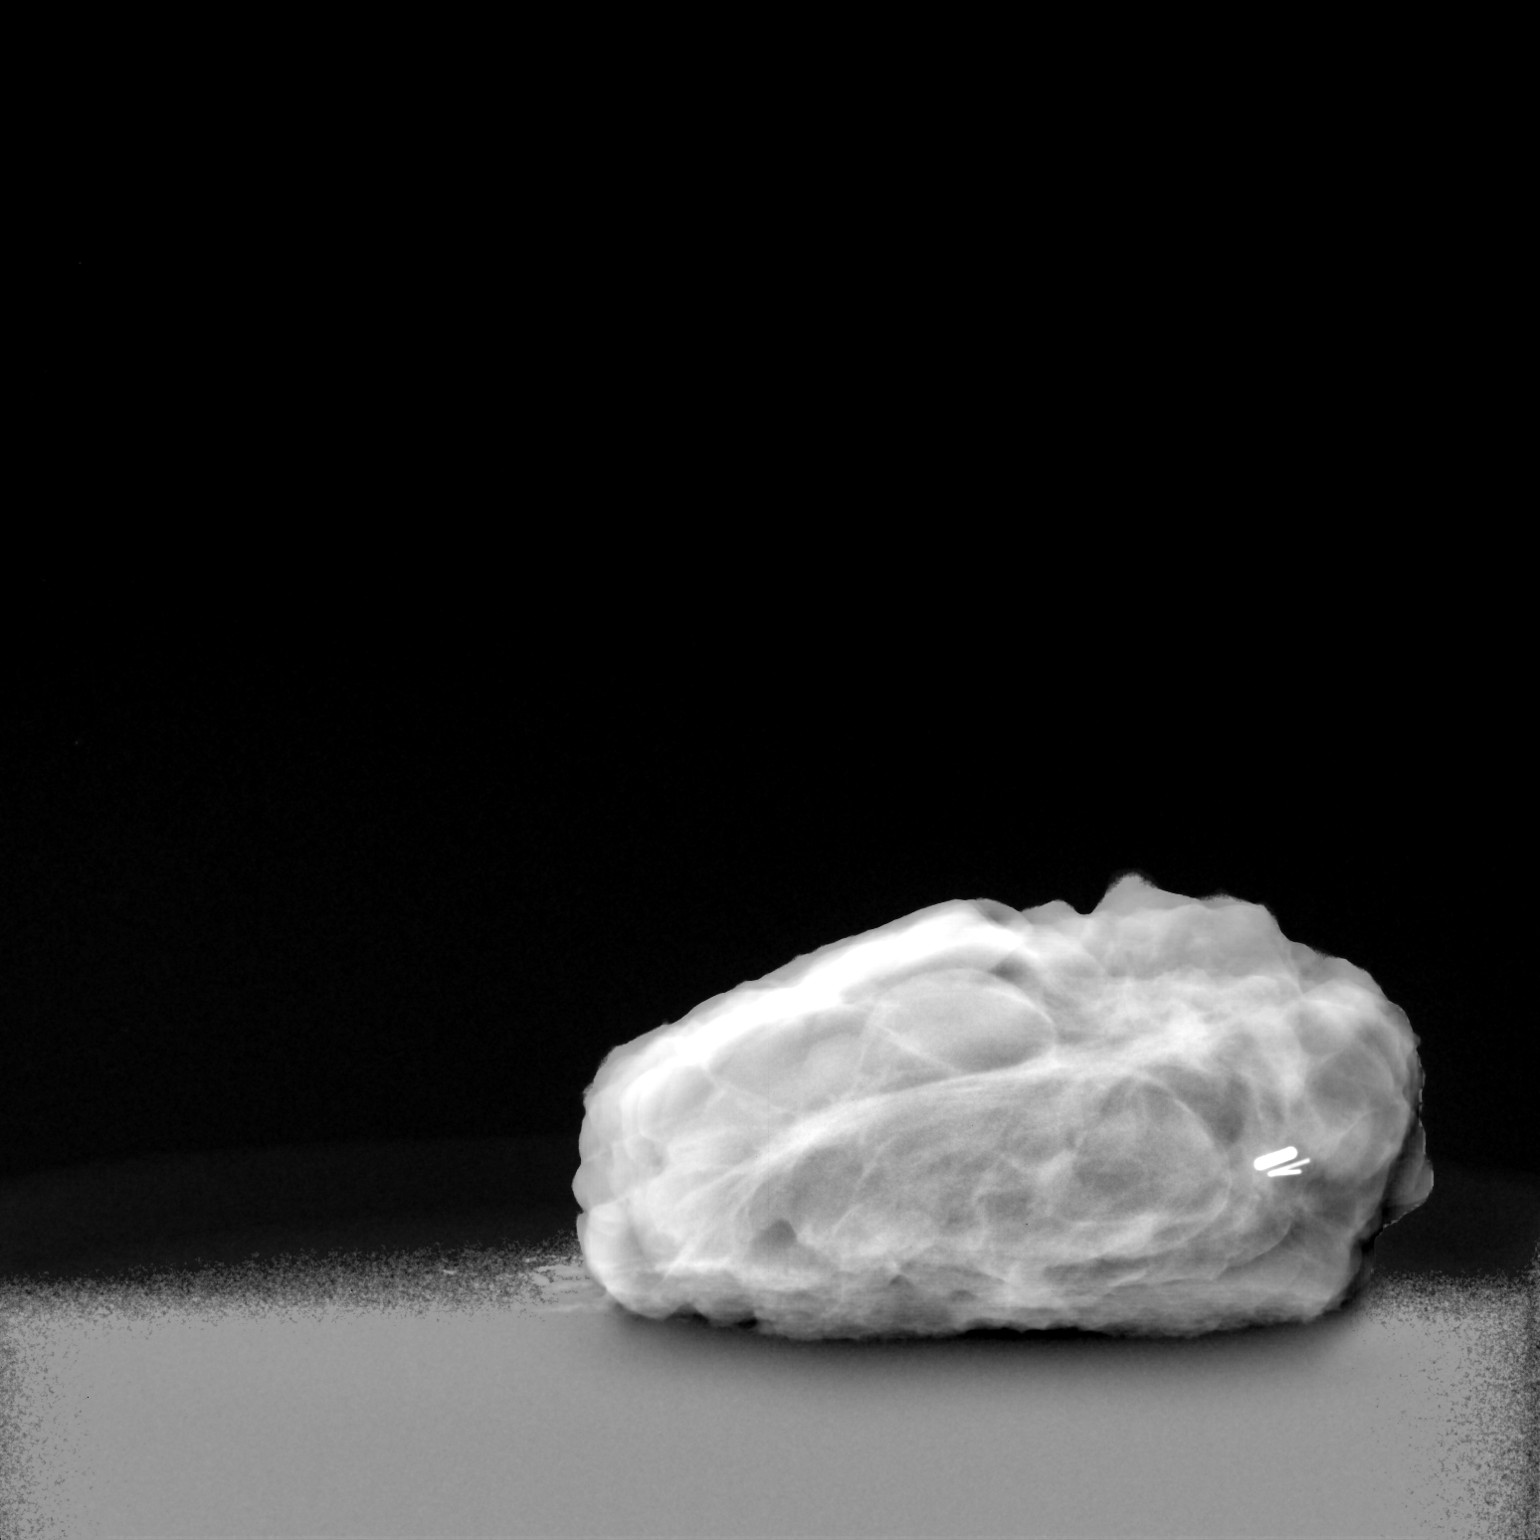
[im 2/2]
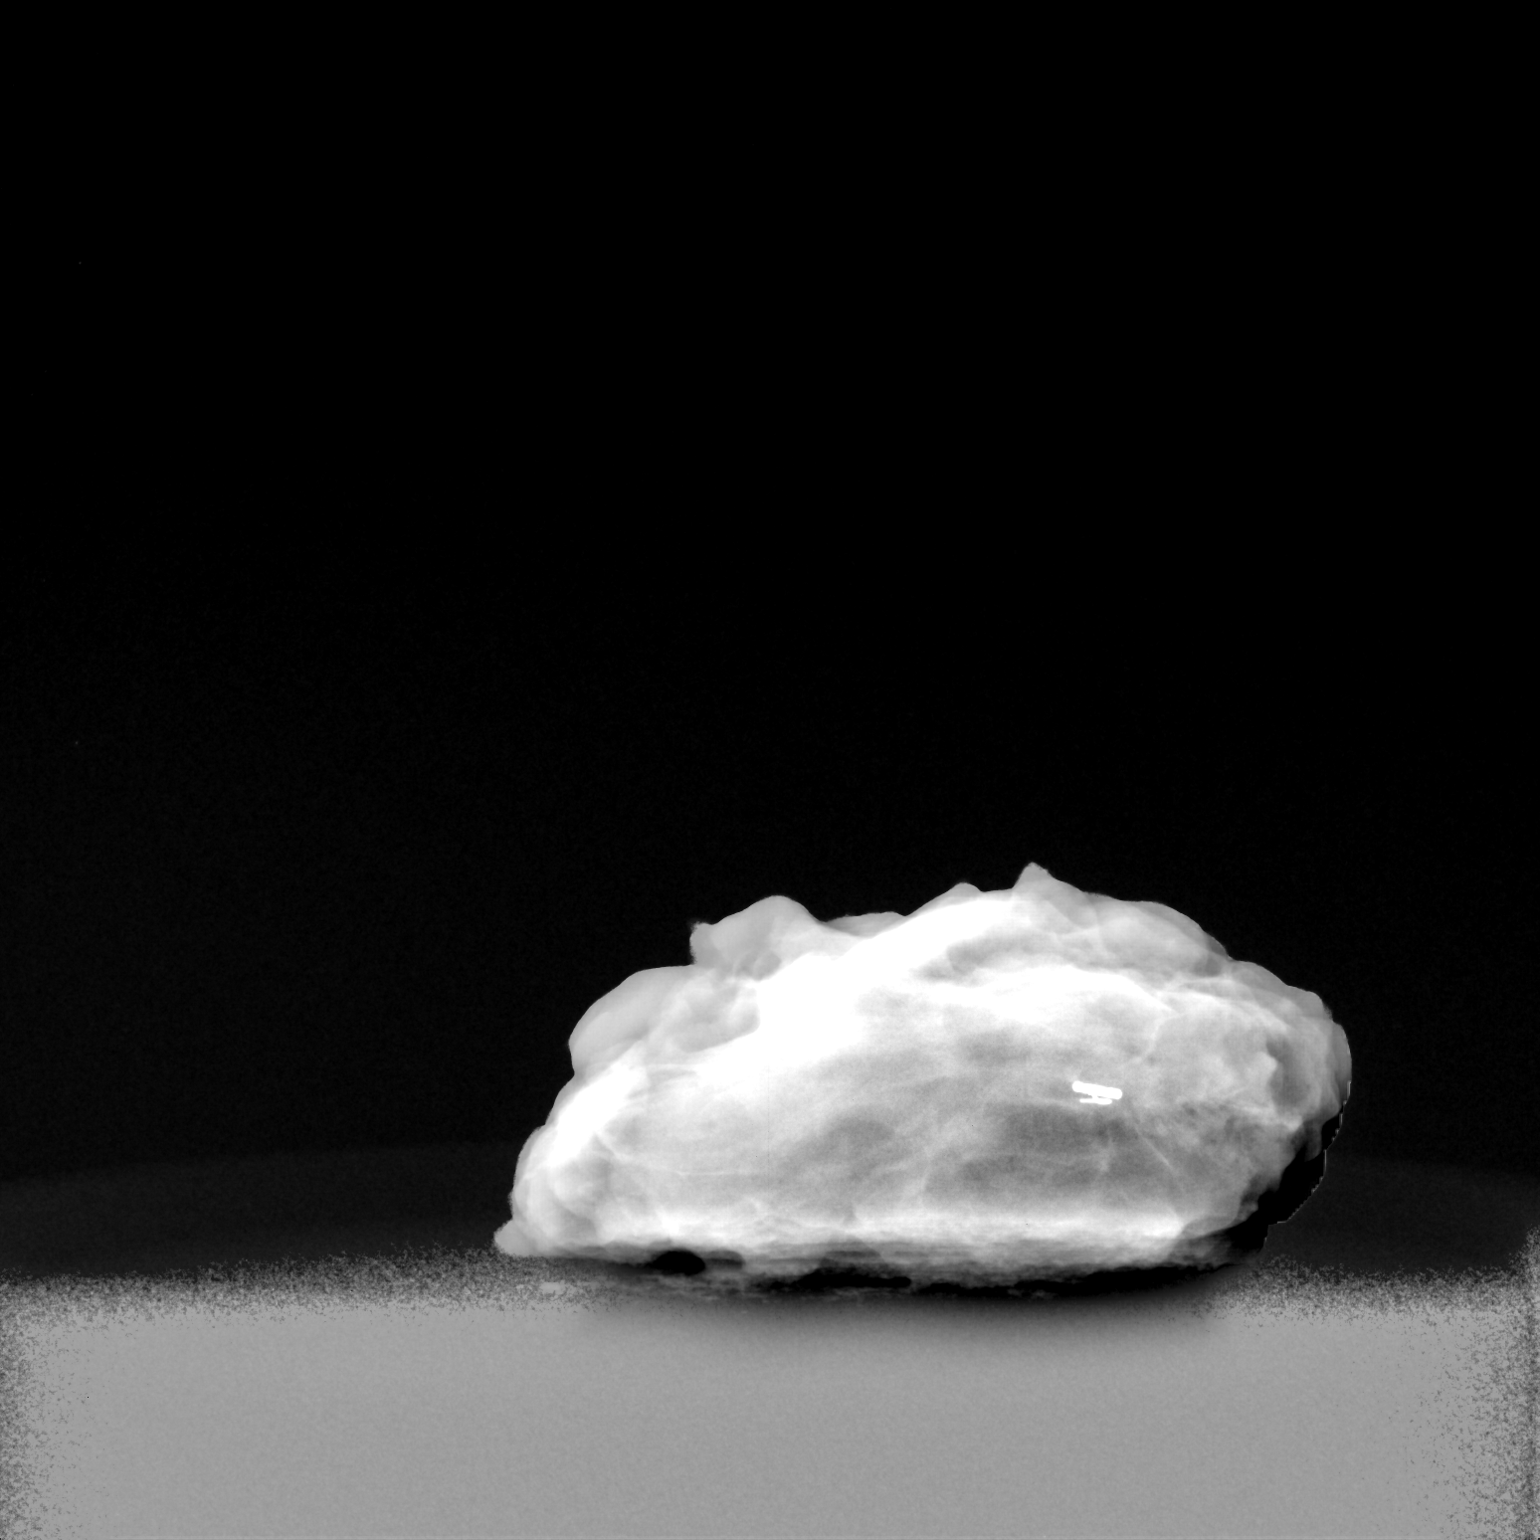

[2 of 2 positions shown; findings below may reference images not displayed]

FINDINGS: Status post excision of the left breast. The radioactive seed and
ribbon shaped biopsy marker clip are present, completely intact.
IMPRESSION: Specimen radiograph of the left breast.

## 2021-12-30 NOTE — Progress Notes (Unsigned)
[place] Phone: [phone] Subjective:   Taylor Blankenship, am serving as a scribe for Dr. Hulan Saas.   I'm seeing this patient by the request  of:  Ginger Organ., MD  CC: Right lower leg pain  [id number]  Taylor Blankenship is a 88 y.o. female coming in with complaint of R lower leg pain. Pain worse with walking and can wake patient up at night. Patient states that she has pain in lower leg over tib anterior and into calf. Had been walking 3 miles a day but had to cut down mileage due to pain. Patient has pain when sleeping. R hip can also be painful over GT. Tried Tylenol and Salonpas. Patient notes skin irritation that has been there for years over calf. Does have pain that will radiate from that area.       Past Medical History:  Diagnosis Date   Actinic keratosis    ASTHMA    Asthma    Blankenship issues now   Breast cancer ([place]) 07/2020   Cancer ([place]) 06/2020   left breast DCIS   Complication of anesthesia    hard to wake up   Dermatophytosis of nail    DJD (degenerative joint disease)    History of shingles    HYPERTENSION    OSTEOPOROSIS    PALPITATIONS, RECURRENT    Past Surgical History:  Procedure Laterality Date   BREAST EXCISIONAL BIOPSY Left 2004   BREAST LUMPECTOMY WITH RADIOACTIVE SEED LOCALIZATION Left 07/02/2020   Procedure: LEFT BREAST LUMPECTOMY WITH RADIOACTIVE SEED LOCALIZATION;  Surgeon: Erroll Luna, MD;  Location: [facility];  Service: General;  Laterality: Left;   COLONOSCOPY     IR ANGIO INTRA EXTRACRAN SEL COM CAROTID INNOMINATE BILAT MOD SED  11/24/2017   IR ANGIO VERTEBRAL SEL VERTEBRAL BILAT MOD SED  11/24/2017   Social History   Socioeconomic History   Marital status: Married    Spouse name: Not on file   Number of children: Not on file   Years of education: Not on file   Highest education level: Not on file  Occupational History   Not on file   Tobacco Use   Smoking status: Former    Packs/day: 0.25    Years: 10.00    Pack years: 2.50    Types: Cigarettes    Quit date: 08/01/1968    Years since quitting: 53.4   Smokeless tobacco: Former  Substance and Sexual Activity   Alcohol use: Yes    Comment: social   Drug use: Blankenship   Sexual activity: Not Currently    Birth control/protection: Post-menopausal  Other Topics Concern   Not on file  Social History Narrative   Not on file   Social Determinants of Health   Financial Resource Strain: Not on file  Food Insecurity: Not on file  Transportation Needs: Not on file  Physical Activity: Not on file  Stress: Not on file  Social Connections: Not on file   Allergies  Allergen Reactions   Amoxicillin Hives, Itching and Rash    Has patient had a PCN reaction causing immediate rash, facial/tongue/throat swelling, SOB or lightheadedness with hypotension: Yes Has patient had a PCN reaction causing severe rash involving mucus membranes or skin necrosis: Blankenship Has patient had a PCN reaction that required hospitalization: Blankenship Has patient had a PCN reaction occurring within the last 10 years: Blankenship If all of the above answers  are "Blankenship", then may proceed with Cephalosporin use.    Cephalexin Hives, Itching and Rash   Doxycycline Rash   Family History  Problem Relation Age of Onset   Arthritis Other    Heart disease Other    Cancer Other        colon   Diabetes Other    Breast cancer Maternal Aunt      Current Outpatient Medications (Cardiovascular):    hydrochlorothiazide (MICROZIDE) 12.5 MG capsule, TAKE 1 CAPSULE BY MOUTH EVERY DAY     Current Outpatient Medications (Other):    calcium carbonate (OSCAL) 1500 (600 Ca) MG TABS tablet, Take by mouth daily. As directed   Cholecalciferol (VITAMIN D) 1000 UNITS capsule, Take 1,000 Units by mouth daily.   COVID-19 mRNA bivalent vaccine, Pfizer, (PFIZER COVID-19 VAC BIVALENT) injection, Inject into the muscle.   COVID-19 mRNA  vaccine, Pfizer, 30 MCG/0.3ML injection, Inject into the muscle.   Multiple Vitamin (MULTIVITAMIN) tablet, Take 1 tablet by mouth daily.   Omega-3 Fatty Acids (FISH OIL) 1000 MG CAPS, Take by mouth.   triamcinolone cream (KENALOG) 0.1 %, Apply 1 application. topically 2 (two) times daily.   Reviewed prior external information including notes and imaging from  primary care provider As well as notes that were available from care everywhere and other healthcare systems.  Past medical history, social, surgical and family history all reviewed in electronic medical record.  Blankenship pertanent information unless stated regarding to the chief complaint.   Review of Systems:  Blankenship headache, visual changes, nausea, vomiting, diarrhea, constipation, dizziness, abdominal pain, skin rash, fevers, chills, night sweats, weight loss, swollen lymph nodes, body aches,  chest pain, shortness of breath, mood changes. POSITIVE muscle aches, joint swelling  Objective  Blood pressure 122/84, pulse 89, height 5\' 3"  (1.6 m), weight 192 lb (87.1 kg), SpO2 98 %.   General: Blankenship apparent distress alert and oriented x3 mood and affect normal, dressed appropriately.  HEENT: Pupils equal, extraocular movements intact  Respiratory: Patient's speak in full sentences and does not appear short of breath  Cardiovascular: 2+ lower extremity edema on the right side and 1+ on the left, tender especially in the popliteal area on the right side more narrowing with medial, Blankenship erythema but patient does have hemosiderin deposits bilaterally Gait severely antalgic favoring the right leg Right knee does have some instability with valgus and varus force.  Trace effusion noted.  Tender to palpation severely in the popliteal area  Limited muscular skeletal ultrasound was performed and interpreted by Hulan Saas, M  Limited ultrasound shows the patient does have a trace effusion noted with moderate to severe patellofemoral narrowing of the  patellofemoral joint.  Patient also has severe narrowing of the lateral joint space.  Degenerative meniscal tearing noted.  Patient also unfortunately has severe tenderness in voluntary withdrawal when going to the popliteal area and unable to close down the popliteal vein completely.    Impression and Recommendations:     The above documentation has been reviewed and is accurate and complete Lyndal Pulley, DO

## 2021-12-31 ENCOUNTER — Ambulatory Visit (INDEPENDENT_AMBULATORY_CARE_PROVIDER_SITE_OTHER): Payer: Medicare Other | Admitting: Family Medicine

## 2021-12-31 ENCOUNTER — Ambulatory Visit: Payer: Self-pay

## 2021-12-31 VITALS — BP 122/84 | HR 89 | Ht 63.0 in | Wt 192.0 lb

## 2021-12-31 DIAGNOSIS — M1711 Unilateral primary osteoarthritis, right knee: Secondary | ICD-10-CM

## 2021-12-31 DIAGNOSIS — M79604 Pain in right leg: Secondary | ICD-10-CM | POA: Diagnosis not present

## 2021-12-31 DIAGNOSIS — I872 Venous insufficiency (chronic) (peripheral): Secondary | ICD-10-CM | POA: Diagnosis not present

## 2021-12-31 DIAGNOSIS — M79605 Pain in left leg: Secondary | ICD-10-CM | POA: Diagnosis not present

## 2021-12-31 NOTE — Patient Instructions (Addendum)
Doppler R lower leg: [facility]  11:00am on Monday, June 5th Arrive 15 min early  Wear brace with a lot of walking  Ice Voltaren Continue vitamins See me again in 6 weeks

## 2021-12-31 NOTE — Assessment & Plan Note (Addendum)
Patient does have significant lateral compartment osteoarthritic changes.  We discussed with patient about icing regimen.  We discussed that I do think patient needs a lateral unloader brace.  Does have a mild abnormal thigh to calf ratio but I am hoping the over-the-counter would be okay.  Patient was fitted today.  We discussed physical therapy which patient declined.  Due to patient having pain in the popliteal area and on my ultrasound had difficulty with true compression of the popliteal vein we will get a Doppler to further evaluate.  Patient is at high risk secondary to history of cancer.  Discussed with patient secondary to the amount of pain in the right lower leg and swelling will consider the possibility of a baby aspirin.  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  Discussed this with patient as well as mother about the risk benefits.  They will consider it and consider just an 81 mg of baby aspirin until patient has the ultrasound on Monday.  Patient as well as daughter know that worsening symptoms such as chest pain, shortness of breath to seek medical attention immediately

## 2022-01-03 ENCOUNTER — Ambulatory Visit (HOSPITAL_COMMUNITY)
Admission: RE | Admit: 2022-01-03 | Discharge: 2022-01-03 | Disposition: A | Discharge status: Home / Self Care | Payer: Medicare Other | Source: Ambulatory Visit | Attending: Family Medicine | Admitting: Family Medicine

## 2022-01-03 DIAGNOSIS — M79604 Pain in right leg: Secondary | ICD-10-CM | POA: Diagnosis present

## 2022-01-03 NOTE — Progress Notes (Signed)
Right lower extremity venous duplex has been completed. Preliminary results can be found in CV Proc through chart review.  Results were given to Almyra Free at Dr. Thompson Caul office.  01/03/22 11:01 AM Carlos Levering RVT

## 2022-01-20 ENCOUNTER — Ambulatory Visit: Payer: Medicare Other | Admitting: Family Medicine

## 2022-01-31 ENCOUNTER — Telehealth: Payer: Self-pay | Admitting: *Deleted

## 2022-01-31 NOTE — Telephone Encounter (Signed)
CALLED PATIENT TO INFORM OF CT FOR 02-16-22- ARRIVAL TIME- 11:30 AM @ [facility], NO RESTRICTIONS TO TEST, PATIENT TO RECEIVE RESULTS FROM [facility]SHLY[facility] BRUNING ON 02/17/22 VIA TELEPHONE WITH RESULTS @ 2 PM, SPOKE WITH PATIENT'S DAUGHTER- SANDRA AND SHE IS AWARE OF THESE APPTS.

## 2022-02-10 NOTE — Progress Notes (Signed)
[facility] [place] Phone: [phone] Subjective:   Taylor Blankenship, am serving as a scribe for Dr. Hulan Saas.   I'm seeing this patient by the requeI, Taylor Blankenship, am serving as a scribe for Dr. Alroy Dust Shanetta Nicolls.st  of:  Ginger Organ., MD  CC: Right knee and leg pain  WNI:[id number]  12/31/2021 Patient does have significant lateral compartment osteoarthritic changes.  We discussed with patient about icing regimen.  We discussed that I do think patient needs a lateral unloader brace.  Does have a mild abnormal thigh to calf ratio but I am hoping the over-the-counter would be okay.  Patient was fitted today.  We discussed physical therapy which patient declined.  Due to patient having pain in the popliteal area and on my ultrasound had difficulty with true compression of the popliteal vein we will get a Doppler to further evaluate.  Patient is at high risk secondary to history of cancer.  Discussed with patient secondary to the amount of pain in the right lower leg and swelling will consider the possibility of a baby aspirin.  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  Discussed this with patient as well as mother about the risk benefits.  They will consider it and consider just an 81 mg of baby aspirin until patient has the ultrasound on Monday.  Patient as well as daughter know that worsening symptoms such as chest pain, shortness of breath to seek medical attention immediately  Update 02/11/2022 Taylor Blankenship is a 88 y.o. female coming in with complaint of R knee and leg pain.  Patient at last exam was found to have arthritic changes.  Dopplers that were done negative.  Patient declined physical therapy but did do over-the-counter bracing.  Patient states that her pain is less than last visit. Wearing brace with activity. Wakes up with pain in distal tibia at times but not every night. Patient  also notes swelling that has not changed since last visit. Swelling occurring in R leg but Blankenship worse than before.      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   Complication of anesthesia    hard to wake up   Dermatophytosis of nail    DJD (degenerative joint disease)    History of shingles    HYPERTENSION    OSTEOPOROSIS    PALPITATIONS, RECURRENT    Past Surgical History:  Procedure Laterality Date   BREAST EXCISIONAL BIOPSY Left 2004   BREAST LUMPECTOMY WITH RADIOACTIVE SEED LOCALIZATION Left 07/02/2020   Procedure: LEFT BREAST LUMPECTOMY WITH RADIOACTIVE SEED LOCALIZATION;  Surgeon: Erroll Luna, MD;  Location: [facility];  Service: General;  Laterality: Left;   COLONOSCOPY     IR ANGIO INTRA EXTRACRAN SEL COM CAROTID INNOMINATE BILAT MOD SED  11/24/2017   IR ANGIO VERTEBRAL SEL VERTEBRAL BILAT MOD SED  11/24/2017   Social History   Socioeconomic History   Marital status: Married    Spouse name: Not on file   Number of children: Not on file   Years of education: Not on file   Highest education level: Not on file  Occupational History   Not on file  Tobacco Use   Smoking status: Former    Packs/day:  0.25    Years: 10.00    Total pack years: 2.50    Types: Cigarettes    Quit date: 08/01/1968    Years since quitting: 53.5   Smokeless tobacco: Former  Substance and Sexual Activity   Alcohol use: Yes    Comment: social   Drug use: Blankenship   Sexual activity: Not Currently    Birth control/protection: Post-menopausal  Other Topics Concern   Not on file  Social History Narrative   Not on file   Social Determinants of Health   Financial Resource Strain: Not on file  Food Insecurity: Not on file  Transportation Needs: Not on file  Physical Activity: Not on file  Stress: Not on file  Social Connections: Not on file    Allergies  Allergen Reactions   Amoxicillin Hives, Itching and Rash    Has patient had a PCN reaction causing immediate rash, facial/tongue/throat swelling, SOB or lightheadedness with hypotension: Yes Has patient had a PCN reaction causing severe rash involving mucus membranes or skin necrosis: Blankenship Has patient had a PCN reaction that required hospitalization: Blankenship Has patient had a PCN reaction occurring within the last 10 years: Blankenship If all of the above answers are "Blankenship", then may proceed with Cephalosporin use.    Cephalexin Hives, Itching and Rash   Doxycycline Rash   Family History  Problem Relation Age of Onset   Arthritis Other    Heart disease Other    Cancer Other        colon   Diabetes Other    Breast cancer Maternal Aunt      Current Outpatient Medications (Cardiovascular):    hydrochlorothiazide (MICROZIDE) 12.5 MG capsule, TAKE 1 CAPSULE BY MOUTH EVERY DAY     Current Outpatient Medications (Other):    calcium carbonate (OSCAL) 1500 (600 Ca) MG TABS tablet, Take by mouth daily. As directed   Cholecalciferol (VITAMIN D) 1000 UNITS capsule, Take 1,000 Units by mouth daily.   COVID-19 mRNA bivalent vaccine, Pfizer, (PFIZER COVID-19 VAC BIVALENT) injection, Inject into the muscle.   COVID-19 mRNA vaccine, Pfizer, 30 MCG/0.3ML injection, Inject into the muscle.   Multiple Vitamin (MULTIVITAMIN) tablet, Take 1 tablet by mouth daily.   Omega-3 Fatty Acids (FISH OIL) 1000 MG CAPS, Take by mouth.   triamcinolone cream (KENALOG) 0.1 %, Apply 1 application. topically 2 (two) times daily.   Reviewed prior external information including notes and imaging from  primary care provider As well as notes that were available from care everywhere and other healthcare systems.  Reviewing patient's chart patient is scheduled [facility] long to follow-up on her breast cancer.  Past medical history, social, surgical and family history all reviewed in electronic medical  record.  Blankenship pertanent information unless stated regarding to the chief complaint.   Review of Systems:  Blankenship headache, visual changes, nausea, vomiting, diarrhea, constipation, dizziness, abdominal pain, skin rash, fevers, chills, night sweats, weight loss, swollen lymph nodes, chest pain, shortness of breath, mood changes. POSITIVE muscle aches, body aches, joint swelling  Objective  Blood pressure 110/74, pulse 69, height 5\' 3"  (1.6 m), weight 194 lb (88 kg).   General: Blankenship apparent distress alert and oriented x3 mood and affect normal, dressed appropriately.  HEENT: Pupils equal, extraocular movements intact  Respiratory: Patient's speak in full sentences and does not appear short of breath  Cardiovascular: 1+  lower extremity edema, non tender, Blankenship erythema  Right knee does have some instability noted.  Valgus force  causes instability.  Tender to palpation over the medial joint space.    Impression and Recommendations:     The above documentation has been reviewed and is accurate and complete Lyndal Pulley, DO

## 2022-02-11 ENCOUNTER — Ambulatory Visit (INDEPENDENT_AMBULATORY_CARE_PROVIDER_SITE_OTHER): Payer: Medicare Other | Admitting: Family Medicine

## 2022-02-11 VITALS — BP 110/74 | HR 69 | Ht 63.0 in | Wt 194.0 lb

## 2022-02-11 DIAGNOSIS — M25472 Effusion, left ankle: Secondary | ICD-10-CM

## 2022-02-11 DIAGNOSIS — M1711 Unilateral primary osteoarthritis, right knee: Secondary | ICD-10-CM | POA: Diagnosis not present

## 2022-02-11 DIAGNOSIS — M25471 Effusion, right ankle: Secondary | ICD-10-CM

## 2022-02-11 DIAGNOSIS — I872 Venous insufficiency (chronic) (peripheral): Secondary | ICD-10-CM

## 2022-02-11 NOTE — Assessment & Plan Note (Signed)
More likely than not is contributing.  This is likely the lower extremity swelling the patient is having.  We will get ABI to make sure nothing else is contributing.  Follow-up again in 3 months if abnormal study though will send to vascular

## 2022-02-11 NOTE — Patient Instructions (Addendum)
Happy Birthday!  [facility]  ([facility] [employer] in [facility]) [address], [address] [place], [place] [address] [phone]  Brace for stability Stay active See me in 3-4 months

## 2022-02-11 NOTE — Assessment & Plan Note (Signed)
Patient has arthritic changes.  Discussed with patient that I could consider the possibility of injections which patient declined at this time.  We discussed viscosupplementation and even the possibility of PRP.  Patient wants to continue with conservative therapy and will follow-up again in 3 months.  Patient's daughter is concerned that some of this could be vascular in nature and will get ABIs to further evaluate.

## 2022-02-16 ENCOUNTER — Ambulatory Visit (HOSPITAL_COMMUNITY)
Admission: RE | Admit: 2022-02-16 | Discharge: 2022-02-16 | Disposition: A | Discharge status: Home / Self Care | Payer: Medicare Other | Source: Ambulatory Visit | Attending: Urology | Admitting: Urology

## 2022-02-16 ENCOUNTER — Other Ambulatory Visit: Payer: Self-pay | Admitting: Family Medicine

## 2022-02-16 ENCOUNTER — Encounter: Payer: Self-pay | Admitting: Urology

## 2022-02-16 DIAGNOSIS — C3411 Malignant neoplasm of upper lobe, right bronchus or lung: Secondary | ICD-10-CM | POA: Diagnosis present

## 2022-02-16 DIAGNOSIS — M25471 Effusion, right ankle: Secondary | ICD-10-CM

## 2022-02-16 DIAGNOSIS — G8929 Other chronic pain: Secondary | ICD-10-CM

## 2022-02-16 NOTE — Progress Notes (Signed)
Telephone appointment. I spoke w/ patient's daughter Ms. Willy Eddy, verified her identity and began nursing interview. No issues reported at this time.  Meaningful use complete.  Reminded Ms. [name] of patient's 2:00pm-02/17/22 telephone appointment w/ Freeman Caldron PA-C. I left my extension [phone] in case patient needs anything. Daughter verbalized understanding.  Patient contact [phone]

## 2022-02-17 ENCOUNTER — Telehealth: Payer: Self-pay | Admitting: *Deleted

## 2022-02-17 ENCOUNTER — Ambulatory Visit
Admission: RE | Admit: 2022-02-17 | Discharge: 2022-02-17 | Disposition: A | Discharge status: Home / Self Care | Payer: Medicare Other | Source: Ambulatory Visit | Attending: Urology | Admitting: Urology

## 2022-02-17 DIAGNOSIS — C3411 Malignant neoplasm of upper lobe, right bronchus or lung: Secondary | ICD-10-CM

## 2022-02-17 DIAGNOSIS — J701 Chronic and other pulmonary manifestations due to radiation: Secondary | ICD-10-CM | POA: Insufficient documentation

## 2022-02-17 NOTE — Telephone Encounter (Signed)
Called patient's daughterKatharine Look to inform of fu with Dr. Valeta Harms on 02-24-22 @ 10:45 am, spoke with patient's daughter- [name] and she is aware of this appt.

## 2022-02-22 ENCOUNTER — Ambulatory Visit (HOSPITAL_COMMUNITY)
Admission: RE | Admit: 2022-02-22 | Discharge: 2022-02-22 | Disposition: A | Discharge status: Home / Self Care | Payer: Medicare Other | Source: Ambulatory Visit | Attending: Cardiovascular Disease | Admitting: Cardiovascular Disease

## 2022-02-22 DIAGNOSIS — M25561 Pain in right knee: Secondary | ICD-10-CM | POA: Insufficient documentation

## 2022-02-22 DIAGNOSIS — M25472 Effusion, left ankle: Secondary | ICD-10-CM | POA: Insufficient documentation

## 2022-02-22 DIAGNOSIS — M79661 Pain in right lower leg: Secondary | ICD-10-CM | POA: Diagnosis not present

## 2022-02-22 DIAGNOSIS — G8929 Other chronic pain: Secondary | ICD-10-CM | POA: Insufficient documentation

## 2022-02-22 DIAGNOSIS — M25471 Effusion, right ankle: Secondary | ICD-10-CM | POA: Diagnosis not present

## 2022-02-22 NOTE — Progress Notes (Signed)
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  The patient was notified in advance and gave permission to proceed with this visit format.  She tolerated radiation treatment well without any ill effects aside from fatigue and persistent shortness of breath with exertion.  Her initial posttreatment CT chest scan on 08/10/2021 showed a stable appearance of the treated right upper lobe lung nodule with surrounding radiation changes and no evidence of metastatic disease.  The hypermetabolic left axillary/subpectoral lymph node, seen on prior PET imaging had decreased in size, measuring 4 mm as compared to 8 mm previously.  Her follow up CT Chest scan from 11/09/21 showed evidence of post-treatment radiation pneumonitis with the recently treated right upper lobe nodule no longer visualized. There were no new or enlarging pulmonary nodules and the previously described left subpectoral lymph remained stable in size, measuring 4 mm with no pathologically enlarged lymph nodes seen in the chest.  She follow up today to review the results of her most recent CT Chest from 02/16/22 which overall, shows stability of her treated disease  with no evidence of recurrent or residual disease within  the chest but does show progressive consolidation and volume loss within the right apex, consistent with progressive post-radiation fibrosis. We reviewed these results today.            On review of systems, the patient states that she has continued doing well in general. She continues with baseline shortness of breath on exertion and fatigue that has not worsened since the time of treatment but has continued with a dry, hacking cough, mainly at night, that seems to be getting a little worse as opposed to resolving. She specifically denies productive cough, hemoptysis, chest pain or increased shortness of breath and has not had fever, chills or night sweats. She has continued with a good appetite and is maintaining her weight. She is concerned with the progressive pulmonary fibrosis on recent CT Chest but, overall, remains pleased with her progress to date.  ALLERGIES:  is allergic to amoxicillin, cephalexin, and doxycycline.  Meds: Current Outpatient Medications  Medication Sig Dispense Refill   calcium carbonate (OSCAL) 1500 (600 Ca) MG TABS tablet Take by mouth daily. As directed     Cholecalciferol (VITAMIN D) 1000 UNITS capsule Take 1,000 Units by mouth daily.     COVID-19 mRNA bivalent vaccine, Pfizer, (PFIZER COVID-19 VAC BIVALENT) injection Inject into the muscle. 0.3 mL 0   COVID-19 mRNA vaccine, Pfizer, 30 MCG/0.3ML injection Inject into the muscle. 0.3 mL 0   hydrochlorothiazide (MICROZIDE) 12.5 MG capsule TAKE 1 CAPSULE BY MOUTH EVERY DAY 100 capsule 4   Multiple Vitamin (MULTIVITAMIN) tablet Take 1 tablet by mouth daily.     Omega-3 Fatty  Acids (FISH OIL) 1000 MG CAPS Take by mouth.     triamcinolone cream (KENALOG) 0.1 % Apply 1 application. topically 2 (two) times daily.     No current facility-administered medications for this encounter.    Physical Findings:  vitals were not taken for this visit.  Pain Assessment Pain Score: 0-No pain/10 Unable to assess due to telephone  follow up visit format.  Lab Findings: Lab Results  Component Value Date   WBC 6.5 06/29/2020   HGB 14.5 06/29/2020   HCT 43.5 06/29/2020   MCV 91.4 06/29/2020   PLT 204 06/29/2020     Radiographic Findings: CT Chest Wo Contrast  Result Date: 02/16/2022 CLINICAL DATA:  Non-small cell lung cancer (NSCLC), non-metastatic, assess treatment response s/p SBRT in 06/2021 EXAM: CT CHEST WITHOUT CONTRAST TECHNIQUE: Multidetector CT imaging of the chest was performed following the standard protocol without IV contrast. RADIATION DOSE REDUCTION: This exam was performed according to the departmental dose-optimization program which includes automated exposure control, adjustment of the mA and/or kV according to patient size and/or use of iterative reconstruction technique. COMPARISON:  None Available. FINDINGS: Cardiovascular: No significant coronary artery calcification. Global cardiac size within normal limits. No pericardial effusion. Central pulmonary arteries are of normal caliber. Mild atherosclerotic calcification within the thoracic aorta. No aortic aneurysm. Mediastinum/Nodes: Visualized thyroid is unremarkable. No pathologic thoracic adenopathy. Stable normal-sized left subpectoral lymph node since remote prior examination of 04/10/2021. Esophagus unremarkable. Lungs/Pleura: There is progressive consolidation and volume loss within the right apex most in keeping with progressive post radiation fibrosis. No new focal pulmonary nodule or infiltrate. No pneumothorax or pleural effusion. Central airways are widely patent. Upper Abdomen: No acute abnormality. Musculoskeletal: Stable sclerotic focus within the T8 vertebral body, possibly representing a small bone island. No suspicious lytic or blastic bone lesion identified. No acute bone abnormality. Surgical clips noted within the left breast. IMPRESSION: Progressive consolidation and volume loss within the right apex most in keeping with progressive post  radiation fibrosis. No evidence of recurrent or residual disease within the chest. Aortic Atherosclerosis (ICD10-I70.0). Electronically Signed   By: Fidela Salisbury M.D.   On: 02/16/2022 19:27    Impression/Plan: 1. 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 She appears to be recovering well from the effects of her recent SBRT.  She continues with a dry, hacking cough and her recent posttreatment CT chest scan does show evidence of progressive pulmonary fibrosis. We discussed this today and she prefers to have a follow up visit with Dr. Valeta Harms, to discuss this further regarding anything she can do to prevent continued progression and/or management of radiation induced-PF. We discussed that Dr. Tammi Klippel and I are happy to continue to follow her with serial CT chest scans every 6 months to continue to monitor for any evidence of disease progression or recurrence unless she decides to continue her follow up care with Dr. Valeta Harms which would be perfectly acceptable as well.  I will share this discussion with Dr. Valeta Harms and once she has had a chance to meet with him, we will decide on how she prefers to continue with follow up so that we can coordinate her care appropriately with duplication of effort. She and her daughter appear to have a good understanding of these recommendations and are comfortable and in agreement.  They know that they are welcome to call at anytime in the interim with any questions or concerns  in the interim.  Given current concerns for patient exposure during the COVID-19 pandemic, this encounter was conducted via telephone. The patient was notified in advance and has given verbal consent for this type of encounter. The time spent during this encounter was 20 minutes, including chart review, reviewing radiological studies, telephone discussion with the patient, entering orders and completing documentation.  The  attendants for this meeting include Rockie Vawter PA-C, patient, Marcene Laskowski and her daughter, [name]. During the encounter, Takeira Yanes PA-C, was located at [facility] Radiation Oncology Department.  Patient, Ruthella Kirchman and her daughter, [name], were located at home.     Nicholos Johns, PA-C

## 2022-02-24 ENCOUNTER — Ambulatory Visit (INDEPENDENT_AMBULATORY_CARE_PROVIDER_SITE_OTHER): Payer: Medicare Other | Admitting: Pulmonary Disease

## 2022-02-24 ENCOUNTER — Other Ambulatory Visit: Payer: Self-pay | Admitting: Urology

## 2022-02-24 ENCOUNTER — Encounter: Payer: Self-pay | Admitting: Pulmonary Disease

## 2022-02-24 VITALS — BP 114/64 | HR 67 | Temp 97.9°F | Ht 63.0 in | Wt 183.0 lb

## 2022-02-24 DIAGNOSIS — C3411 Malignant neoplasm of upper lobe, right bronchus or lung: Secondary | ICD-10-CM

## 2022-02-24 DIAGNOSIS — J701 Chronic and other pulmonary manifestations due to radiation: Secondary | ICD-10-CM

## 2022-02-24 NOTE — Progress Notes (Deleted)
Synopsis: Referred in *** for *** by Ginger Organ., MD  Subjective:   PATIENT ID: Taylor Blankenship GENDER: female DOB: [birth year], MRN: [id number]  No chief complaint on file.   HPI  Claira E. Mckercher was referred to our clinic for radiation-induced pulmonary fibrosis. She has a history of R upper lobe adenocarcinoma and L breast cancer. Last SBRT treatment was in November 2022.  Her current respiratory symptoms are ***.  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   Complication of anesthesia    hard to wake up   Dermatophytosis of nail    DJD (degenerative joint disease)    History of shingles    HYPERTENSION    OSTEOPOROSIS    PALPITATIONS, RECURRENT      Family History  Problem Relation Age of Onset   Arthritis Other    Heart disease Other    Cancer Other        colon   Diabetes Other    Breast cancer Maternal Aunt      Past Surgical History:  Procedure Laterality Date   BREAST EXCISIONAL BIOPSY Left 2004   BREAST LUMPECTOMY WITH RADIOACTIVE SEED LOCALIZATION Left 07/02/2020   Procedure: LEFT BREAST LUMPECTOMY WITH RADIOACTIVE SEED LOCALIZATION;  Surgeon: Erroll Luna, MD;  Location: [facility];  Service: General;  Laterality: Left;   COLONOSCOPY     IR ANGIO INTRA EXTRACRAN SEL COM CAROTID INNOMINATE BILAT MOD SED  11/24/2017   IR ANGIO VERTEBRAL SEL VERTEBRAL BILAT MOD SED  11/24/2017    Social History   Socioeconomic History   Marital status: Married    Spouse name: Not on file   Number of children: Not on file   Years of education: Not on file   Highest education level: Not on file  Occupational History   Not on file  Tobacco Use   Smoking status: Former    Packs/day: 0.25    Years: 10.00    Total pack years: 2.50    Types: Cigarettes    Quit date: 08/01/1968    Years since quitting: 53.6    Smokeless tobacco: Former  Substance and Sexual Activity   Alcohol use: Yes    Comment: social   Drug use: No   Sexual activity: Not Currently    Birth control/protection: Post-menopausal  Other Topics Concern   Not on file  Social History Narrative   Not on file   Social Determinants of Health   Financial Resource Strain: Not on file  Food Insecurity: Not on file  Transportation Needs: Not on file  Physical Activity: Not on file  Stress: Not on file  Social Connections: Not on file  Intimate Partner Violence: Not on file     Allergies  Allergen Reactions   Amoxicillin Hives, Itching and Rash    Has patient had a PCN reaction causing immediate rash, facial/tongue/throat swelling, SOB or lightheadedness with hypotension: Yes Has patient had a PCN reaction causing severe rash involving mucus membranes or skin necrosis: No Has patient had a PCN reaction that required hospitalization: No Has patient had a PCN reaction occurring within the last 10 years: No If all of the above answers are "NO", then may proceed with Cephalosporin use.    Cephalexin Hives, Itching and Rash   Doxycycline Rash  Outpatient Medications Prior to Visit  Medication Sig Dispense Refill   calcium carbonate (OSCAL) 1500 (600 Ca) MG TABS tablet Take by mouth daily. As directed     Cholecalciferol (VITAMIN D) 1000 UNITS capsule Take 1,000 Units by mouth daily.     COVID-19 mRNA bivalent vaccine, Pfizer, (PFIZER COVID-19 VAC BIVALENT) injection Inject into the muscle. 0.3 mL 0   COVID-19 mRNA vaccine, Pfizer, 30 MCG/0.3ML injection Inject into the muscle. 0.3 mL 0   hydrochlorothiazide (MICROZIDE) 12.5 MG capsule TAKE 1 CAPSULE BY MOUTH EVERY DAY 100 capsule 4   Multiple Vitamin (MULTIVITAMIN) tablet Take 1 tablet by mouth daily.     Omega-3 Fatty Acids (FISH OIL) 1000 MG CAPS Take by mouth.     triamcinolone cream (KENALOG) 0.1 % Apply 1 application. topically 2 (two) times daily.     No  facility-administered medications prior to visit.    ROS   Objective:  Physical Exam   There were no vitals filed for this visit.   on *** LPM *** RA BMI Readings from Last 3 Encounters:  02/11/22 34.37 kg/m  12/31/21 34.01 kg/m  04/26/21 31.00 kg/m   Wt Readings from Last 3 Encounters:  02/11/22 194 lb (88 kg)  12/31/21 192 lb (87.1 kg)  04/26/21 175 lb (79.4 kg)     CBC    Component Value Date/Time   WBC 6.5 06/29/2020 0945   RBC 4.76 06/29/2020 0945   HGB 14.5 06/29/2020 0945   HCT 43.5 06/29/2020 0945   PLT 204 06/29/2020 0945   MCV 91.4 06/29/2020 0945   MCH 30.5 06/29/2020 0945   MCHC 33.3 06/29/2020 0945   RDW 12.8 06/29/2020 0945   LYMPHSABS 2.1 06/29/2020 0945   MONOABS 0.6 06/29/2020 0945   EOSABS 0.4 06/29/2020 0945   BASOSABS 0.1 06/29/2020 0945     Chest Imaging: CT chest 01/2022 Progressive consolidation and volume loss within the right apex most in keeping with progressive post radiation fibrosis. No evidence of recurrent or residual disease within the chest.  CT chest 10/2021 Increased right upper lobe ground-glass and consolidative opacities with previously described right upper lobe nodule not visualized, findings are likely due to post radiation pneumonitis. No evidence of metastatic disease.  Pulmonary Functions Testing Results:     No data to display           Echocardiogram: 11/2015 - Left ventricle: The cavity size was normal. Systolic function was vigorous. The estimated ejection fraction was in the range of 65% to 70%. Wall motion was normal; there were no regional wall motion abnormalities. Doppler parameters are consistent with abnormal left ventricular relaxation (grade 1 diastolic dysfunction).  - Aortic valve: Trileaflet; mildly thickened, mildly calcified leaflets. There was mild regurgitation.  - Mitral valve: Moderately calcified annulus.  - Left atrium: The atrium was mildly dilated.  - Pulmonary arteries: Systolic  pressure was mildly increased. PA peak pressure: 35 mm Hg.     Assessment & Plan:   No diagnosis found.  Discussion: ***   Current Outpatient Medications:    calcium carbonate (OSCAL) 1500 (600 Ca) MG TABS tablet, Take by mouth daily. As directed, Disp: , Rfl:    Cholecalciferol (VITAMIN D) 1000 UNITS capsule, Take 1,000 Units by mouth daily., Disp: , Rfl:    COVID-19 mRNA bivalent vaccine, Pfizer, (PFIZER COVID-19 VAC BIVALENT) injection, Inject into the muscle., Disp: 0.3 mL, Rfl: 0   COVID-19 mRNA vaccine, Pfizer, 30 MCG/0.3ML injection, Inject into the muscle., Disp: 0.3 mL, Rfl: 0  hydrochlorothiazide (MICROZIDE) 12.5 MG capsule, TAKE 1 CAPSULE BY MOUTH EVERY DAY, Disp: 100 capsule, Rfl: 4   Multiple Vitamin (MULTIVITAMIN) tablet, Take 1 tablet by mouth daily., Disp: , Rfl:    Omega-3 Fatty Acids (FISH OIL) 1000 MG CAPS, Take by mouth., Disp: , Rfl:    triamcinolone cream (KENALOG) 0.1 %, Apply 1 application. topically 2 (two) times daily., Disp: , Rfl:   I spent *** minutes dedicated to the care of this patient on the date of this encounter to include pre-visit review of records, face-to-face time with the patient discussing conditions above, post visit ordering of testing, clinical documentation with the electronic health record, making appropriate referrals as documented, and communicating necessary findings to members of the patients care team.   Farrel Gordon, DO [facility] 02/24/2022 7:54 AM

## 2022-02-24 NOTE — Patient Instructions (Signed)
Thank you for visiting Dr. Chesley Valls at [facility]. Today we recommend the following:  Return if symptoms worsen or fail to improve.    Please do your part to reduce the spread of COVID-19.  

## 2022-02-24 NOTE — Progress Notes (Signed)
PCCM attending:  This is a 88 year old female Initially was seen in September 2022 with a history of breast cancer status post removal, history of melanoma status post removal, CVA.  She had a incidental right upper lobe groundglass lesion concerning for a primary malignancy and she was referred to radiation oncology for empiric SBRT.  She had CT imaging concerning for radiation-induced fibrosis of the right upper lobe.  She has had some increased cough.  It seems like she is still able to tolerate all of her activities of daily living even though she "feels more tired" when asked to quantify this they were relatively unable to do so.  The patient seems upset about having scarring in her lungs related to the radiation.  I explained to her and showed images of which how it has progressed over the past 3 months and that it is normal finding to receive after getting radiation treatments.  She is worried that this will progress and change her life in the future.    BP 114/64 (BP Location: Left Arm, Cuff Size: Normal)   Pulse 67   Temp 97.9 F (36.6 C) (Oral)   Ht 5\' 3"  (1.6 m)   Wt 183 lb (83 kg)   SpO2 98%   BMI 32.42 kg/m   General: Elderly female, no acute distress HEENT: NCAT, tracking appropriately Heart: Regular rhythm S1-S2 Lungs: Clear to auscultation bilaterally Lower extremities: Edema  CT imaging: Evidence of right upper lobe consolidation and fibrosis from radiation treatments. The patient's images have been independently reviewed by me.    Assessment: Right upper lobe groundglass opacity concerning for a primary malignancy decision was made by patient to undergo SBRT treatments empirically. Now has radiation-induced changes in the right upper lobe. - Overall has very little side effects related to this.  She occasionally has cough which may or may not even be related to the right upper lobe changes she is able to complete all of her ADLs.  She does feel "more tired" but unable to  quantify this.  Plan: Gave patient reassurance that there was no additional treatment needed for her right upper lobe findings. She can have serial images to look for recurrence of disease that have already been scheduled by radiation oncology. She can follow-up with [name] in the future as needed.  I agree with documentation from Farrel Gordon, DO PGY 2.  Garner Nash, DO [facility] 02/24/2022 11:50 AM

## 2022-02-24 NOTE — Progress Notes (Signed)
Synopsis: Referred in September 2022 for lung nodule by Taylor Blankenship., MD  Subjective:   PATIENT ID: Taylor Blankenship GENDER: female DOB: [birth year], MRN: [id number]  Chief Complaint  Patient presents with   Follow-up    She has increased radiation exposure,  her last radiation was in 11/22 or 12/22. She has noticed increase in shortness of breath with exertion.     OV 04/22/2021: This is a 88 year old female, history of breast cancer status post removal, melanoma status post removal, had a CVA, bleeding event brought to the hospital had CT imaging that was completed which revealed an incidental right upper lobe groundglass nodule.  She had subsequent follow-up of this nodule after serial images which has shown a minimal amount of progression over time.  They have been following this conservatively due to her age.  We discussed the images today in the office as an probability of malignancy.  From a respiratory standpoint she has no complaints and able to complete all of her activities of daily living.  She is hard of hearing however.  She is a retired Scientist, research (life sciences).  OV 02/24/2022: Patient returns to clinic today due to concern for radiation-induced pulmonary fibrosis. She has noticed an increased dry, hacking cough since radiation therapy. She has not had any fever, chills, weight loss. She is able to tolerate the stationary bike at the gym several times per week and is not hindered by her breathing. She has had decrease in exercise tolerance however notes that she has been having trouble with her R leg which is contributing to this moreso than respiratory symptoms. She remains active and able to care for herself.    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   Complication of anesthesia    hard to wake up   Dermatophytosis of nail    DJD  (degenerative joint disease)    History of shingles    HYPERTENSION    OSTEOPOROSIS    PALPITATIONS, RECURRENT      Family History  Problem Relation Age of Onset   Arthritis Other    Heart disease Other    Cancer Other        colon   Diabetes Other    Breast cancer Maternal Aunt      Past Surgical History:  Procedure Laterality Date   BREAST EXCISIONAL BIOPSY Left 2004   BREAST LUMPECTOMY WITH RADIOACTIVE SEED LOCALIZATION Left 07/02/2020   Procedure: LEFT BREAST LUMPECTOMY WITH RADIOACTIVE SEED LOCALIZATION;  Surgeon: Erroll Luna, MD;  Location: [facility];  Service: General;  Laterality: Left;   COLONOSCOPY     IR ANGIO INTRA EXTRACRAN SEL COM CAROTID INNOMINATE BILAT MOD SED  11/24/2017   IR ANGIO VERTEBRAL SEL VERTEBRAL BILAT MOD SED  11/24/2017    Social History   Socioeconomic History   Marital status: Married    Spouse name: Not on file   Number of children: Not on file   Years of education: Not on file   Highest education level: Not on file  Occupational History   Not on file  Tobacco Use   Smoking status: Former    Packs/day: 0.25    Years: 10.00    Total pack years: 2.50    Types: Cigarettes    Quit date: 08/01/1968  Years since quitting: 53.6   Smokeless tobacco: Former  Substance and Sexual Activity   Alcohol use: Yes    Comment: social   Drug use: No   Sexual activity: Not Currently    Birth control/protection: Post-menopausal  Other Topics Concern   Not on file  Social History Narrative   Not on file   Social Determinants of Health   Financial Resource Strain: Not on file  Food Insecurity: Not on file  Transportation Needs: Not on file  Physical Activity: Not on file  Stress: Not on file  Social Connections: Not on file  Intimate Partner Violence: Not on file     Allergies  Allergen Reactions   Amoxicillin Hives, Itching and Rash    Has patient had a PCN reaction causing immediate rash, facial/tongue/throat swelling,  SOB or lightheadedness with hypotension: Yes Has patient had a PCN reaction causing severe rash involving mucus membranes or skin necrosis: No Has patient had a PCN reaction that required hospitalization: No Has patient had a PCN reaction occurring within the last 10 years: No If all of the above answers are "NO", then may proceed with Cephalosporin use.    Cephalexin Hives, Itching and Rash   Doxycycline Rash     Outpatient Medications Prior to Visit  Medication Sig Dispense Refill   calcium carbonate (OSCAL) 1500 (600 Ca) MG TABS tablet Take by mouth daily. As directed     Cholecalciferol (VITAMIN D) 1000 UNITS capsule Take 1,000 Units by mouth daily.     hydrochlorothiazide (MICROZIDE) 12.5 MG capsule TAKE 1 CAPSULE BY MOUTH EVERY DAY 100 capsule 4   Multiple Vitamin (MULTIVITAMIN) tablet Take 1 tablet by mouth daily.     Omega-3 Fatty Acids (FISH OIL) 1000 MG CAPS Take by mouth.     triamcinolone cream (KENALOG) 0.1 % Apply 1 application. topically 2 (two) times daily.     COVID-19 mRNA vaccine, Pfizer, 30 MCG/0.3ML injection Inject into the muscle. 0.3 mL 0   COVID-19 mRNA bivalent vaccine, Pfizer, (PFIZER COVID-19 VAC BIVALENT) injection Inject into the muscle. (Patient not taking: Reported on 02/24/2022) 0.3 mL 0   No facility-administered medications prior to visit.    Review of Systems  Constitutional:  Negative for chills, fever, malaise/fatigue and weight loss.  Respiratory:  Positive for cough, sputum production and wheezing.   Cardiovascular:  Positive for leg swelling (RLE). Negative for chest pain, palpitations, orthopnea and PND.  Musculoskeletal:  Negative for myalgias.  Neurological:  Negative for weakness.  All other systems reviewed and are negative.    Objective:  Constitutional:Elderly female in no acute distress. Cardio:Regular rate and rhythm. No murmurs, rubs, or gallops. Pulm:Clear to auscultation bilaterally. Normal work of breathing on room air. MSK:1+  pitting edema to distal RLE. Skin:Warm and dry.  Neuro:Awake, alert, and oriented. No focal deficit noted. Psych:Pleasant mood and affect.  Vitals:   02/24/22 1049  BP: 114/64  Pulse: 67  Temp: 97.9 F (36.6 C)  TempSrc: Oral  SpO2: 98%  Weight: 183 lb (83 kg)  Height: 5\' 3"  (1.6 m)   98% on RA BMI Readings from Last 3 Encounters:  02/24/22 32.42 kg/m  02/11/22 34.37 kg/m  12/31/21 34.01 kg/m   Wt Readings from Last 3 Encounters:  02/24/22 183 lb (83 kg)  02/11/22 194 lb (88 kg)  12/31/21 192 lb (87.1 kg)     CBC    Component Value Date/Time   WBC 6.5 06/29/2020 0945   RBC 4.76 06/29/2020 0945   HGB  14.5 06/29/2020 0945   HCT 43.5 06/29/2020 0945   PLT 204 06/29/2020 0945   MCV 91.4 06/29/2020 0945   MCH 30.5 06/29/2020 0945   MCHC 33.3 06/29/2020 0945   RDW 12.8 06/29/2020 0945   LYMPHSABS 2.1 06/29/2020 0945   MONOABS 0.6 06/29/2020 0945   EOSABS 0.4 06/29/2020 0945   BASOSABS 0.1 06/29/2020 0945     Chest Imaging: CT chest 02/16/2022 Progressive consolidation and volume loss within the right apex most in keeping with progressive post radiation fibrosis. No evidence of recurrent or residual disease within the chest.  CT chest 04/10/2021 Patient has a mixed subsolid groundglass nodule in the central portion of the right upper lobe the largest cross-section at 2.2 cm.   Pulmonary Functions Testing Results:     No data to display          Echocardiogram:  11/2015 - Left ventricle: The cavity size was normal. Systolic function was vigorous. The estimated ejection fraction was in the range of 65% to 70%. Wall motion was normal; there were no regional wall motion abnormalities. Doppler parameters are consistent with abnormal left ventricular relaxation (grade 1 diastolic dysfunction).  - Aortic valve: Trileaflet; mildly thickened, mildly calcified leaflets. There was mild regurgitation.  - Mitral valve: Moderately calcified annulus.  - Left atrium:  The atrium was mildly dilated.  - Pulmonary arteries: Systolic pressure was mildly increased. PA peak pressure: 35 mm Hg.     Assessment & Plan:     ICD-10-CM   1. Radiation-induced pulmonary fibrosis (HCC)  J70.1        Discussion: Patient has concerns today due to recent CT chest report that noted increased size of fibrotic changes in her R upper lobe after radiation therapy. She is largely asymptomatic from this, reporting just a dry hacking cough. Patient was counseled extensively on her presentation including comparing scans from April and this month, discussing how fibrosis occurs and can change, and how there are no definitive therapies at this time. Her concern is becoming debilitated from side effects of radiation therapy. Prior to completion of the appointment she was in good understanding of the changes on imaging and had no further questions. -Return to clinic as needed for progressive respiratory symptoms    Current Outpatient Medications:    calcium carbonate (OSCAL) 1500 (600 Ca) MG TABS tablet, Take by mouth daily. As directed, Disp: , Rfl:    Cholecalciferol (VITAMIN D) 1000 UNITS capsule, Take 1,000 Units by mouth daily., Disp: , Rfl:    hydrochlorothiazide (MICROZIDE) 12.5 MG capsule, TAKE 1 CAPSULE BY MOUTH EVERY DAY, Disp: 100 capsule, Rfl: 4   Multiple Vitamin (MULTIVITAMIN) tablet, Take 1 tablet by mouth daily., Disp: , Rfl:    Omega-3 Fatty Acids (FISH OIL) 1000 MG CAPS, Take by mouth., Disp: , Rfl:    triamcinolone cream (KENALOG) 0.1 %, Apply 1 application. topically 2 (two) times daily., Disp: , Rfl:   I spent 40 minutes dedicated to the care of this patient on the date of this encounter to include pre-visit review of records, face-to-face time with the patient discussing conditions above, post visit ordering of testing, clinical documentation with the electronic health record, making appropriate referrals as documented, and communicating necessary findings to  members of the patients care team.   Farrel Gordon, DO [facility] 02/24/2022 11:22 AM

## 2022-03-09 ENCOUNTER — Ambulatory Visit: Payer: Medicare Other | Admitting: Pulmonary Disease

## 2022-05-19 NOTE — Progress Notes (Signed)
[facility] [place] Phone: [phone] Subjective:   Rito Ehrlich, am serving as a scribe for Dr. Hulan Saas. I'm seeing this patient by the request  of:  Ginger Organ., MD  CC: Ankle pain knee pain on the right side.  ACZ:[id number]  02/11/2022 More likely than not is contributing.  This is likely the lower extremity swelling the patient is having.  We will get ABI to make sure nothing else is contributing.  Follow-up again in 3 months if abnormal study though will send to vascular  Patient has arthritic changes.  Discussed with patient that I could consider the possibility of injections which patient declined at this time.  We discussed viscosupplementation and even the possibility of PRP.  Patient wants to continue with conservative therapy and will follow-up again in 3 months.  Patient's daughter is concerned that some of this could be vascular in nature and will get ABIs to further evaluate.  Update 05/20/2022 Taylor Blankenship is a 88 y.o. female coming in with complaint of ankle and R knee pain. Patient states that she is feeling like her ankle and her knee are doing alright the only thing that she still has trouble with is when sitting too long she has some trouble get up, but once she gets up she is okay.  States that not having anything significant that is stopping her from activity.  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   Complication of anesthesia    hard to wake up   Dermatophytosis of nail    DJD (degenerative joint disease)    History of shingles    HYPERTENSION    OSTEOPOROSIS    PALPITATIONS, RECURRENT    Past Surgical History:  Procedure Laterality Date   BREAST EXCISIONAL BIOPSY Left 2004   BREAST LUMPECTOMY  WITH RADIOACTIVE SEED LOCALIZATION Left 07/02/2020   Procedure: LEFT BREAST LUMPECTOMY WITH RADIOACTIVE SEED LOCALIZATION;  Surgeon: Erroll Luna, MD;  Location: [facility];  Service: General;  Laterality: Left;   COLONOSCOPY     IR ANGIO INTRA EXTRACRAN SEL COM CAROTID INNOMINATE BILAT MOD SED  11/24/2017   IR ANGIO VERTEBRAL SEL VERTEBRAL BILAT MOD SED  11/24/2017   Social History   Socioeconomic History   Marital status: Married    Spouse name: Not on file   Number of children: Not on file   Years of education: Not on file   Highest education level: Not on file  Occupational History   Not on file  Tobacco Use   Smoking status: Former    Packs/day: 0.25    Years: 10.00    Total pack years: 2.50    Types: Cigarettes    Quit date: 08/01/1968    Years since quitting: 53.8   Smokeless tobacco: Former  Substance and Sexual Activity   Alcohol use: Yes    Comment: social   Drug use: No   Sexual activity: Not Currently    Birth control/protection: Post-menopausal  Other Topics Concern   Not on file  Social History Narrative   Not on file   Social Determinants of Health   Financial Resource Strain: Not on file  Food Insecurity: Not  on file  Transportation Needs: Not on file  Physical Activity: Not on file  Stress: Not on file  Social Connections: Not on file   Allergies  Allergen Reactions   Amoxicillin Hives, Itching and Rash    Has patient had a PCN reaction causing immediate rash, facial/tongue/throat swelling, SOB or lightheadedness with hypotension: Yes Has patient had a PCN reaction causing severe rash involving mucus membranes or skin necrosis: No Has patient had a PCN reaction that required hospitalization: No Has patient had a PCN reaction occurring within the last 10 years: No If all of the above answers are "NO", then may proceed with Cephalosporin use.    Cephalexin Hives, Itching and Rash   Doxycycline Rash   Family History  Problem  Relation Age of Onset   Arthritis Other    Heart disease Other    Cancer Other        colon   Diabetes Other    Breast cancer Maternal Aunt      Current Outpatient Medications (Cardiovascular):    hydrochlorothiazide (MICROZIDE) 12.5 MG capsule, TAKE 1 CAPSULE BY MOUTH EVERY DAY     Current Outpatient Medications (Other):    calcium carbonate (OSCAL) 1500 (600 Ca) MG TABS tablet, Take by mouth daily. As directed   Cholecalciferol (VITAMIN D) 1000 UNITS capsule, Take 1,000 Units by mouth daily.   Multiple Vitamin (MULTIVITAMIN) tablet, Take 1 tablet by mouth daily.   Omega-3 Fatty Acids (FISH OIL) 1000 MG CAPS, Take by mouth.   triamcinolone cream (KENALOG) 0.1 %, Apply 1 application. topically 2 (two) times daily.    Objective  Blood pressure 124/72, pulse 95, height 5\' 3"  (1.6 m), weight 183 lb (83 kg), SpO2 96 %.   General: No apparent distress alert and oriented x3 mood and affect normal, dressed appropriately.  HEENT: Pupils equal, extraocular movements intact  Respiratory: Patient's speak in full sentences and does not appear short of breath  Cardiovascular: No lower extremity edema, non tender, no erythema  Mild antalgic gait noted. Patient's right knee has a trace effusion noted.  Flexion to 100 degrees which is improved.  Still instability with valgus and varus force.   Impression and Recommendations:    The above documentation has been reviewed and is accurate and complete Lyndal Pulley, DO

## 2022-05-20 ENCOUNTER — Ambulatory Visit (INDEPENDENT_AMBULATORY_CARE_PROVIDER_SITE_OTHER): Payer: Medicare Other | Admitting: Family Medicine

## 2022-05-20 DIAGNOSIS — M1711 Unilateral primary osteoarthritis, right knee: Secondary | ICD-10-CM

## 2022-05-20 NOTE — Patient Instructions (Addendum)
Good to see you I am so glad you are doing so much better Have Happy Holliday's  See me in 3-4 months or when you need me

## 2022-05-20 NOTE — Assessment & Plan Note (Signed)
Patient is doing very well at this time.  The at last appointment we did send patient for an ABI that were independently visualized by me showing the patient has no difficulty with her blood flow.  We discussed with patient about different treatment options for this knee but at the moment still seems to be doing relatively well we will make no other significant changes.  Can see [name] again in 2 to 3 months if necessary.

## 2022-06-02 ENCOUNTER — Telehealth: Payer: Self-pay | Admitting: *Deleted

## 2022-06-02 NOTE — Telephone Encounter (Signed)
CALLED PATIENT TO INFORM OF CT FOR 06-17-22- ARRIVAL TIME- 12:45 PM @ [facility], NO RESTRICTIONS TO TEST, PATIENT TO RECEIVE RESULTS FROM ASHLYN BRUNING ON 06-22-22 @ 11 AM VIA TELEPHONE, SPOKE WITH PATIENT'S DAUGHTER- SANDRA AND SHE IS AWARE OF THESE APPTS. AND THE INSTRUCTIONS

## 2022-06-17 ENCOUNTER — Ambulatory Visit (HOSPITAL_COMMUNITY)
Admission: RE | Admit: 2022-06-17 | Discharge: 2022-06-17 | Disposition: A | Discharge status: Home / Self Care | Payer: Medicare Other | Source: Ambulatory Visit | Attending: Urology | Admitting: Urology

## 2022-06-17 DIAGNOSIS — J701 Chronic and other pulmonary manifestations due to radiation: Secondary | ICD-10-CM | POA: Insufficient documentation

## 2022-06-17 DIAGNOSIS — C3411 Malignant neoplasm of upper lobe, right bronchus or lung: Secondary | ICD-10-CM | POA: Insufficient documentation

## 2022-06-20 ENCOUNTER — Encounter: Payer: Self-pay | Admitting: Urology

## 2022-06-20 NOTE — Progress Notes (Signed)
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 Patient to receive most recent CT results Per Ashlyn Bruning PA-C. I verified patient's identity and began nursing interview. Patient reports a mild cough. No other issues reported at this time.   Meaningful use complete.   Patient aware of their 11:00am-06/22/22 telephone appointment w/ Ashlyn Bruning PA-C. I left my extension [phone] in case patient needs anything. Patient verbalized understanding. This concludes the nursing interview.   Patient contact [phone]    Leandra Kern, LPN

## 2022-06-22 ENCOUNTER — Ambulatory Visit
Admission: RE | Admit: 2022-06-22 | Discharge: 2022-06-22 | Disposition: A | Discharge status: Home / Self Care | Payer: Medicare Other | Source: Ambulatory Visit | Attending: Urology | Admitting: Urology

## 2022-06-22 DIAGNOSIS — C3411 Malignant neoplasm of upper lobe, right bronchus or lung: Secondary | ICD-10-CM

## 2022-06-22 NOTE — Progress Notes (Signed)
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  The patient was notified in advance and gave permission to proceed with this visit format.  She tolerated radiation treatment well without any ill effects aside from fatigue and persistent shortness of breath with exertion.  Her initial posttreatment CT chest scan on 08/10/2021 showed a stable appearance of the treated right upper lobe lung nodule with surrounding radiation changes and no evidence of metastatic disease.  The hypermetabolic left axillary/subpectoral lymph node, seen on prior PET imaging had decreased in size, measuring 4 mm as compared to 8 mm previously.  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 She and her daughter were quite concerned with the progressive fibrosis so I referred her back to Dr. Valeta Harms in pulmonology for his opinion. He felt that these were expected post-treatment changes only and since she was not have any progressive or activity limiting symptomology, no further medical management was warranted, just continued monitoring. She follows up today to review the results of her most recent CT Chest from 06/17/22 which overall, shows stability of  her treated disease with no evidence of recurrent or residual disease within the chest but does show some slight progressive fibrotic consolidation within the right apex, consistent with progressive post-radiation fibrosis. We reviewed these results today.            On review of systems, the patient states that she has continued doing well in general. She continues with baseline shortness of breath on exertion and fatigue that has not worsened since the time of treatment, She has also continued with a dry, hacking cough, mainly at night, that is not progressively worsening. She specifically denies productive cough, hemoptysis, chest pain or increased shortness of breath and has not had fever, chills or night sweats. She has continued with a good appetite and is maintaining her weight. She remains concerned with the progressive pulmonary fibrosis on follow up CT Chest scans but, overall, remains pleased with her progress to date.  ALLERGIES:  is allergic to amoxicillin, cephalexin, and doxycycline.  Meds: Current Outpatient Medications  Medication Sig Dispense Refill   calcium carbonate (OSCAL) 1500 (600 Ca) MG TABS tablet Take by mouth daily. As directed     Cholecalciferol (VITAMIN D) 1000 UNITS capsule Take 1,000 Units by mouth daily.     hydrochlorothiazide (MICROZIDE) 12.5 MG capsule TAKE 1 CAPSULE BY MOUTH EVERY DAY 100 capsule 4   Multiple Vitamin (MULTIVITAMIN) tablet Take 1 tablet by mouth daily.     Omega-3 Fatty Acids (FISH OIL) 1000 MG CAPS Take by mouth.     triamcinolone cream (KENALOG) 0.1 % Apply 1 application. topically 2 (two) times daily.  No current facility-administered medications for this encounter.    Physical Findings:  vitals were not taken for this visit.  Pain Assessment Pain Score: 0-No pain/10 Unable to assess due to telephone follow up visit format.  Lab Findings: Lab Results  Component Value Date   WBC 6.5 06/29/2020   HGB 14.5 06/29/2020   HCT 43.5  06/29/2020   MCV 91.4 06/29/2020   PLT 204 06/29/2020     Radiographic Findings: CT Chest Wo Contrast  Result Date: 06/19/2022 CLINICAL DATA:  Non small cell lung cancer, assess treatment response, status post SBRT * Tracking Code: BO * EXAM: CT CHEST WITHOUT CONTRAST TECHNIQUE: Multidetector CT imaging of the chest was performed following the standard protocol without IV contrast. RADIATION DOSE REDUCTION: This exam was performed according to the departmental dose-optimization program which includes automated exposure control, adjustment of the mA and/or kV according to patient size and/or use of iterative reconstruction technique. COMPARISON:  02/16/2022 FINDINGS: Cardiovascular: Aortic atherosclerosis normal heart size. No pericardial effusion. Mediastinum/Nodes: No enlarged mediastinal, hilar, or axillary lymph nodes. Thyroid gland, trachea, and esophagus demonstrate no significant findings. Lungs/Pleura: Interval increase in fibrotic consolidation of the right pulmonary apex, measuring 4.1 x 3.0 cm in maximum axial section (series 7, image 26), with resolution of previously seen adjacent heterogeneous and ground-glass airspace opacity no pleural effusion or pneumothorax. Upper Abdomen: No acute abnormality. Musculoskeletal: Status post left lumpectomy (series 2, image 70). No acute osseous findings. IMPRESSION: 1. Interval increase in fibrotic consolidation of the right pulmonary apex, with resolution of previously seen adjacent heterogeneous and ground-glass airspace opacity. Findings remain consistent with developing radiation fibrosis following SBRT. 2. No evidence of recurrent or metastatic disease in the chest. 3. Status post left lumpectomy. Aortic Atherosclerosis (ICD10-I70.0). Electronically Signed   By: Delanna Ahmadi M.D.   On: 06/19/2022 14:11    Impression/Plan: 1. 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 She appears to be recovering well from the effects of her recent SBRT.  She continues with a dry, hacking cough that is not progressively worsening and her recent posttreatment CT chest scan shows only minimal progressive pulmonary fibrosis. Therefore, we will continue to monitor with serial CT Chest scans every 6 months for the next 4 years and pending her disease remains stable, we can then move to annual, low dose CT Chest scans thereafter. She and her daughter appear to have a good understanding of these recommendations and are comfortable and in agreement.  They know that they are welcome to call at anytime in the interim with any questions or concerns in the interim.  Given current concerns for patient exposure during the COVID-19 pandemic, this encounter was conducted via telephone. The patient was notified in advance and has given verbal consent for this type of encounter. The time spent during this encounter was 20 minutes, including chart review, reviewing radiological studies, telephone discussion with the patient, entering orders and completing documentation.  The attendants for this meeting include Taylor Blankenship, patient, Taylor Blankenship and her daughter, Taylor Blankenship. During the encounter, Taylor Blankenship, was located at [facility] Radiation Oncology Department.  Patient, Taylor Blankenship and her daughter, Taylor Blankenship, were located at home.     Taylor Johns, Blankenship

## 2022-08-15 NOTE — Progress Notes (Deleted)
[name] [address] [place] [address] Phone: [phone] Subjective:    I'm seeing this patient by the request  of:  [name]., MD  CC:   [id number]  05/20/2022 Patient is doing very well at this time.  The at last appointment we did send patient for an ABI that were independently visualized by me showing the patient has no difficulty with her blood flow.  We discussed with patient about different treatment options for this knee but at the moment still seems to be doing relatively well we will make no other significant changes.  Can see [name] again in 2 to 3 months if necessary.   Update 08/18/2022 Taylor Blankenship is a 89 y.o. female coming in with complaint of R knee pain. 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   Complication of anesthesia    hard to wake up   Dermatophytosis of nail    DJD (degenerative joint disease)    History of shingles    HYPERTENSION    OSTEOPOROSIS    PALPITATIONS, RECURRENT    Past Surgical History:  Procedure Laterality Date   BREAST EXCISIONAL BIOPSY Left 2004   BREAST LUMPECTOMY WITH RADIOACTIVE SEED LOCALIZATION Left 07/02/2020   Procedure: LEFT BREAST LUMPECTOMY WITH RADIOACTIVE SEED LOCALIZATION;  Surgeon: Harriette Bouillon, MD;  Location: [facility];  Service: General;  Laterality: Left;   COLONOSCOPY     IR ANGIO INTRA EXTRACRAN SEL COM CAROTID INNOMINATE BILAT MOD SED  11/24/2017   IR ANGIO VERTEBRAL SEL VERTEBRAL BILAT MOD SED  11/24/2017   Social History   Socioeconomic History   Marital status: Married    Spouse name: Not on file   Number of children: Not on file   Years of education: Not on file   Highest education level: Not on file  Occupational History   Not on file  Tobacco Use   Smoking status: Former     Packs/day: 0.25    Years: 10.00    Total pack years: 2.50    Types: Cigarettes    Quit date: 08/01/1968    Years since quitting: 54.0   Smokeless tobacco: Former  Substance and Sexual Activity   Alcohol use: Yes    Comment: social   Drug use: No   Sexual activity: Not Currently    Birth control/protection: Post-menopausal  Other Topics Concern   Not on file  Social History Narrative   Not on file   Social Determinants of Health   Financial Resource Strain: Not on file  Food Insecurity: Not on file  Transportation Needs: Not on file  Physical Activity: Not on file  Stress: Not on file  Social Connections: Not on file   Allergies  Allergen Reactions   Amoxicillin Hives, Itching and Rash    Has patient had a PCN reaction causing immediate rash, facial/tongue/throat swelling, SOB or lightheadedness with hypotension: Yes Has patient had a PCN reaction causing severe rash involving mucus membranes or skin necrosis: No Has patient had a PCN reaction that required hospitalization: No Has patient had a PCN reaction occurring within the last 10 years: No If all of the above answers are "NO", then may proceed with Cephalosporin use.  Cephalexin Hives, Itching and Rash   Doxycycline Rash   Family History  Problem Relation Age of Onset   Arthritis Other    Heart disease Other    Cancer Other        colon   Diabetes Other    Breast cancer Maternal Aunt      Current Outpatient Medications (Cardiovascular):    hydrochlorothiazide (MICROZIDE) 12.5 MG capsule, TAKE 1 CAPSULE BY MOUTH EVERY DAY     Current Outpatient Medications (Other):    calcium carbonate (OSCAL) 1500 (600 Ca) MG TABS tablet, Take by mouth daily. As directed   Cholecalciferol (VITAMIN D) 1000 UNITS capsule, Take 1,000 Units by mouth daily.   Multiple Vitamin (MULTIVITAMIN) tablet, Take 1 tablet by mouth daily.   Omega-3 Fatty Acids (FISH OIL) 1000 MG CAPS, Take by mouth.   triamcinolone cream (KENALOG)  0.1 %, Apply 1 application. topically 2 (two) times daily.   Reviewed prior external information including notes and imaging from  primary care provider As well as notes that were available from care everywhere and other healthcare systems.  Past medical history, social, surgical and family history all reviewed in electronic medical record.  No pertanent information unless stated regarding to the chief complaint.   Review of Systems:  No headache, visual changes, nausea, vomiting, diarrhea, constipation, dizziness, abdominal pain, skin rash, fevers, chills, night sweats, weight loss, swollen lymph nodes, body aches, joint swelling, chest pain, shortness of breath, mood changes. POSITIVE muscle aches  Objective  There were no vitals taken for this visit.   General: No apparent distress alert and oriented x3 mood and affect normal, dressed appropriately.  HEENT: Pupils equal, extraocular movements intact  Respiratory: Patient's speak in full sentences and does not appear short of breath  Cardiovascular: No lower extremity edema, non tender, no erythema      Impression and Recommendations:

## 2022-08-18 ENCOUNTER — Ambulatory Visit: Payer: Medicare Other | Admitting: Family Medicine

## 2022-10-19 ENCOUNTER — Ambulatory Visit: Payer: Medicare Other | Admitting: Family Medicine

## 2022-12-15 ENCOUNTER — Telehealth: Payer: Self-pay | Admitting: *Deleted

## 2022-12-15 NOTE — Telephone Encounter (Signed)
CALLED PATIENT TO INFORM OF CT FOR 01-03-23- ARRIVAL TIME- 9:15 AM @ [facility], NO RESTRICTIONS TO TEST, PATIENT TO RECEIVE RESULTS FROM ASHLYN BRUNING ON 01-04-23 @ 9:30 AM, SPOKE WITH PATIENT'S DAUGHTER- SANDRA AND SHE IS AWARE OF THESE APPTS. AND THE INSTRUCTIONS

## 2022-12-21 ENCOUNTER — Ambulatory Visit: Payer: Medicare Other | Admitting: Family Medicine

## 2022-12-22 ENCOUNTER — Ambulatory Visit: Payer: Self-pay | Admitting: Urology

## 2023-01-02 ENCOUNTER — Other Ambulatory Visit (HOSPITAL_COMMUNITY): Payer: Medicare Other

## 2023-01-03 ENCOUNTER — Ambulatory Visit (HOSPITAL_COMMUNITY)
Admission: RE | Admit: 2023-01-03 | Discharge: 2023-01-03 | Disposition: A | Discharge status: Home / Self Care | Payer: Medicare Other | Source: Ambulatory Visit | Attending: Urology | Admitting: Urology

## 2023-01-03 ENCOUNTER — Encounter (HOSPITAL_COMMUNITY): Payer: Self-pay

## 2023-01-03 DIAGNOSIS — C3411 Malignant neoplasm of upper lobe, right bronchus or lung: Secondary | ICD-10-CM

## 2023-01-04 ENCOUNTER — Encounter: Payer: Self-pay | Admitting: Urology

## 2023-01-04 ENCOUNTER — Ambulatory Visit
Admission: RE | Admit: 2023-01-04 | Discharge: 2023-01-04 | Disposition: A | Discharge status: Home / Self Care | Payer: Medicare Other | Source: Ambulatory Visit | Attending: Urology | Admitting: Urology

## 2023-01-04 DIAGNOSIS — C3411 Malignant neoplasm of upper lobe, right bronchus or lung: Secondary | ICD-10-CM

## 2023-01-04 NOTE — Progress Notes (Signed)
Telephone nursing appointment for patient to review most recent scan results from 12/21/2022. I verified patient's identity x2 and began nursing interview. Patient reports doing well. No issues conveyed at this time.   Meaningful use complete.   Patient aware of their 9:30am-01/04/2023 telephone appointment w/ Ashlyn Bruning PA-C. I left my extension [phone] in case patient needs anything. Patient verbalized understanding. This concludes the nursing interview.   Patient contact [phone]     Ruel Favors, LPN

## 2023-01-04 NOTE — Progress Notes (Signed)
Radiation Oncology         [phone]) [phone] ________________________________  Name: Taylor Blankenship MRN: [id number]  Date: 01/04/2023  DOB: 1928/08/27  Post Treatment Note  CC: Taylor Blankenship., MD  Taylor Igo, DO  Diagnosis:   89 yo woman with putative 22 mm NSCLC of the right upper lobe of the lung - cT1cN0M0, Stage IA3.  Interval Since Last Radiation: 1.5 years  06/16/21 - 06/22/21: SBRT// The target in the RUL lung was treated to 54 Gy in 3 fractions of 18 Gy  Narrative: I spoke with the patient to conduct her routine scheduled follow up visit via telephone to review the results of her recent posttreatment CT Chest scan to spare the patient unnecessary potential exposure in the healthcare setting during the current COVID-19 pandemic.  The patient was notified in advance and gave permission to proceed with this visit format.  She tolerated radiation treatment well without any ill effects aside from fatigue and persistent shortness of breath with exertion.  Her initial posttreatment CT chest scan on 08/10/2021 showed a stable appearance of the treated right upper lobe lung nodule with surrounding radiation changes and no evidence of metastatic disease.  The hypermetabolic left axillary/subpectoral lymph node, seen on prior PET imaging had decreased in size, measuring 4 mm as compared to 8 mm previously.  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 She and her daughter were quite concerned with the progressive fibrosis so I referred her back to Dr. Tonia Blankenship in pulmonology for his opinion. He felt that these were expected post-treatment changes only and since she was not have any progressive or activity limiting symptomology, no further medical management was warranted, just continued monitoring. She follows up today to review the results of her most recent CT Chest from 01/03/23 which shows stability of her treated disease with no evidence of  recurrent or residual disease within the chest and stable post-treatment changes in the RUL lung. We reviewed these results today.            On review of systems, the patient states that she has continued doing well in general. She continues with baseline shortness of breath on exertion and fatigue that has not worsened since the time of treatment, She has also continued with a dry, hacking cough, mainly at night, that is slightly improved and certainly not worsening. She specifically denies productive cough, hemoptysis, chest pain or increased shortness of breath and has not had fever, chills or night sweats. She has continued with a good appetite and is maintaining her weight. She is pleased to hear that the post-treatment fibrosis is not continuing to progress and overall, remains pleased with her progress to date.  ALLERGIES:  is allergic to amoxicillin, cephalexin, and doxycycline.  Meds: Current Outpatient Medications  Medication Sig Dispense Refill   calcium carbonate (OSCAL) 1500 (600 Ca) MG TABS tablet Take by mouth daily. As directed     Cholecalciferol (VITAMIN D) 1000 UNITS capsule Take 1,000 Units by mouth daily.     hydrochlorothiazide (MICROZIDE) 12.5 MG capsule TAKE 1 CAPSULE BY MOUTH EVERY DAY 100 capsule 4   Multiple Vitamin (MULTIVITAMIN) tablet Take 1 tablet by mouth daily.     Omega-3 Fatty Acids (FISH OIL) 1000 MG CAPS Take by mouth.     triamcinolone cream (KENALOG) 0.1 % Apply 1 application. topically 2 (two) times daily.     No current facility-administered medications for this  encounter.    Physical Findings:  vitals were not taken for this visit.  Pain Assessment Pain Score: 0-No pain/10 Unable to assess due to telephone follow up visit format.  Lab Findings: Lab Results  Component Value Date   WBC 6.5 06/29/2020   HGB 14.5 06/29/2020   HCT 43.5 06/29/2020   MCV 91.4 06/29/2020   PLT 204 06/29/2020     Radiographic Findings: No results  found.  Impression/Plan: 1. 89 yo woman with putative 22 mm NSCLC of the right upper lobe of the lung - cT1cN0M0, Stage IA3. She appears to be recovering well from the effects of her recent SBRT.  She continues with a dry, hacking cough that is slightly improved and her recent posttreatment CT chest scan shows stability of her treated disease with no evidence of recurrent or residual disease within the chest and stable post-treatment changes in the RUL lung. Therefore, we will continue to monitor with serial CT Chest scans every 6 months for the next 3.5 years and pending her disease remains stable, we can then move to annual, low dose CT Chest scans thereafter. She and her daughter, Taylor Blankenship, appear to have a good understanding of these recommendations and are comfortable and in agreement.  They know that they are welcome to call at anytime in the interim with any questions or concerns in the interim.  Given current concerns for patient exposure during the COVID-19 pandemic, this encounter was conducted via telephone. The patient was notified in advance and has given verbal consent for this type of encounter.   The attendants for this meeting include Taylor Crespo PA-C, patient, Taylor Blankenship and her daughter, Taylor Blankenship. During the encounter, Taylor Shannahan PA-C, was located at [facility] Radiation Oncology Department.  Patient, Taylor Blankenship and her daughter, Taylor Blankenship, were located at home.   The time spent during this encounter was 20 minutes, including chart review, reviewing radiological studies, telephone discussion with the patient, entering orders and completing documentation.    Taylor Arbour, PA-C

## 2023-03-07 ENCOUNTER — Ambulatory Visit: Payer: Medicare Other | Admitting: Family Medicine

## 2023-03-21 ENCOUNTER — Telehealth: Payer: Self-pay | Admitting: *Deleted

## 2023-03-21 NOTE — Telephone Encounter (Signed)
Called patient to inform of Ct for 07-06-23- arrival time- 2 pm @ [facility],no restrictions to the test, patient to receive results from [employer] on 07-12-23 @ 10:30 am  via telephone, spoke with patient's daughter [name] and she is aware of these appts. and the instructions

## 2023-04-13 ENCOUNTER — Other Ambulatory Visit (HOSPITAL_BASED_OUTPATIENT_CLINIC_OR_DEPARTMENT_OTHER): Payer: Self-pay

## 2023-04-13 MED ORDER — COMIRNATY 30 MCG/0.3ML IM SUSY
0.3000 mL | PREFILLED_SYRINGE | Freq: Once | INTRAMUSCULAR | 0 refills | Status: AC
  Filled 2023-04-13: qty 0.3, 1d supply, fill #0

## 2023-05-02 ENCOUNTER — Ambulatory Visit: Payer: Medicare Other | Admitting: Family Medicine

## 2023-07-06 ENCOUNTER — Ambulatory Visit (HOSPITAL_COMMUNITY)
Admission: RE | Admit: 2023-07-06 | Discharge: 2023-07-06 | Disposition: A | Discharge status: Home / Self Care | Payer: Medicare Other | Source: Ambulatory Visit | Attending: Urology | Admitting: Urology

## 2023-07-06 DIAGNOSIS — C3411 Malignant neoplasm of upper lobe, right bronchus or lung: Secondary | ICD-10-CM | POA: Diagnosis present

## 2023-07-12 ENCOUNTER — Ambulatory Visit
Admission: RE | Admit: 2023-07-12 | Discharge: 2023-07-12 | Disposition: A | Discharge status: Home / Self Care | Payer: Medicare Other | Source: Ambulatory Visit | Attending: Urology | Admitting: Urology

## 2023-07-12 ENCOUNTER — Encounter: Payer: Self-pay | Admitting: Urology

## 2023-07-12 DIAGNOSIS — C3411 Malignant neoplasm of upper lobe, right bronchus or lung: Secondary | ICD-10-CM

## 2023-07-12 NOTE — Progress Notes (Signed)
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  Interval Since Last Radiation: 2 years  06/16/21 - 06/22/21: SBRT// The target in the RUL lung was treated to 54 Gy in 3 fractions of 18 Gy  Narrative: I spoke with the patient to conduct her routine scheduled follow up visit via telephone to review the results of her recent posttreatment CT Chest scan to spare the patient unnecessary potential exposure in the healthcare setting during the current COVID-19 pandemic.  The patient was notified in advance and gave permission to proceed with this visit format.  She tolerated radiation treatment well without any ill effects aside from fatigue and persistent shortness of breath with exertion.  Her initial posttreatment CT chest scan on 08/10/2021 showed a stable appearance of the treated right upper lobe lung nodule with surrounding radiation changes and no evidence of metastatic disease.  The hypermetabolic left axillary/subpectoral lymph node, seen on prior PET imaging had decreased in size, measuring 4 mm as compared to 8 mm previously.  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 She and her daughter were quite concerned with the progressive fibrosis so I referred her back to Dr. Tonia Brooms in pulmonology for his opinion. He felt that these were expected post-treatment changes only and since she was not having any progressive or activity limiting symptomology, no further medical management was warranted, just continued monitoring. She follows up today to review the results of her most recent CT Chest from 07/06/23 which shows stability of her treated disease with no evidence  of recurrent or residual disease within the chest and stable post-treatment changes in the RUL lung. We reviewed these results today.            On review of systems, the patient states that she has continued doing well in general. She continues with baseline shortness of breath on exertion and fatigue that has not worsened since the time of treatment, She has also continued with a dry, hacking cough, mainly at night, that is slightly improved and certainly not worsening. She specifically denies productive cough, hemoptysis, chest pain or increased shortness of breath and has not had fever, chills or night sweats. She has continued with a good appetite and is maintaining her weight. She is pleased to hear that the post-treatment fibrosis is not continuing to progress and overall, remains pleased with her progress to date.  ALLERGIES:  is allergic to amoxicillin, cephalexin, and doxycycline.  Meds: Current Outpatient Medications  Medication Sig Dispense Refill   calcium carbonate (OSCAL) 1500 (600 Ca) MG TABS tablet Take by mouth daily. As directed     Cholecalciferol (VITAMIN D) 1000 UNITS capsule Take 1,000 Units by mouth daily.     hydrochlorothiazide (MICROZIDE) 12.5 MG capsule TAKE 1 CAPSULE BY MOUTH EVERY DAY 100 capsule 4   Multiple Vitamin (MULTIVITAMIN) tablet Take 1 tablet by mouth daily.     Omega-3 Fatty Acids (FISH OIL) 1000 MG CAPS Take by mouth.     triamcinolone cream (KENALOG) 0.1 % Apply 1 application. topically 2 (two) times daily.     No current facility-administered medications for this  encounter.    Physical Findings:  vitals were not taken for this visit.   /10 Unable to assess due to telephone follow up visit format.  Lab Findings: Lab Results  Component Value Date   WBC 6.5 06/29/2020   HGB 14.5 06/29/2020   HCT 43.5 06/29/2020   MCV 91.4 06/29/2020   PLT 204 06/29/2020     Radiographic Findings: CT Chest Wo Contrast  Result Date: 07/11/2023 CLINICAL  DATA:  Right upper lobe non-small cell lung cancer status post radiation therapy EXAM: CT CHEST WITHOUT CONTRAST TECHNIQUE: Multidetector CT imaging of the chest was performed following the standard protocol without IV contrast. RADIATION DOSE REDUCTION: This exam was performed according to the departmental dose-optimization program which includes automated exposure control, adjustment of the mA and/or kV according to patient size and/or use of iterative reconstruction technique. COMPARISON:  01/03/2023, 06/17/2012 FINDINGS: Cardiovascular: Unenhanced imaging of the heart is unremarkable without pericardial effusion. Dense calcification of the mitral annulus. Normal caliber of the thoracic aorta. Atherosclerosis of the aorta and coronary vasculature again noted and unchanged. Evaluation of the vascular lumen is limited without IV contrast. Mediastinum/Nodes: No pathologic adenopathy within the mediastinum, hila, or axillary regions. Thyroid, trachea, and esophagus are unremarkable. Lungs/Pleura: Dense rounded consolidation within the right upper lobe is again noted, measuring 4.6 x 3.0 cm reference image 24/6, previously 4.3 x 2.6 cm. No new areas of consolidation, effusion, or pneumothorax. Mild bronchiectasis unchanged. The central airways are patent. Upper Abdomen: No acute abnormality. Musculoskeletal: No acute or destructive bony abnormalities. Stable postsurgical changes within the left breast. Reconstructed images demonstrate no additional findings. IMPRESSION: 1. Post therapeutic changes again seen at the right apex, with slight increased size of the rounded consolidation consistent with progressive scarring and fibrosis. 2. Otherwise no acute intrathoracic process. 3. Aortic Atherosclerosis (ICD10-I70.0). Coronary artery atherosclerosis. Electronically Signed   By: Sharlet Salina M.D.   On: 07/11/2023 21:23    Impression/Plan: 1. 89 yo woman with putative 22 mm NSCLC of the right upper lobe of the lung -  cT1cN0M0, Stage IA3. She appears to be recovering well from the effects of her recent SBRT.  She continues with a dry, hacking cough that is slightly improved and her recent posttreatment CT chest scan shows stability of her treated disease with no evidence of recurrent or residual disease within the chest and stable post-treatment changes in the RUL lung. Therefore, we will continue to monitor with serial CT Chest scans every 6 months for the next 3 years and pending her disease remains stable, we can then move to annual, low dose CT Chest scans thereafter. She and her daughter, Taylor Blankenship, appear to have a good understanding of these recommendations and are comfortable and in agreement.  They know that they are welcome to call at anytime in the interim with any questions or concerns in the interim.  Given current concerns for patient exposure during the COVID-19 pandemic, this encounter was conducted via telephone. The patient was notified in advance and has given verbal consent for this type of encounter.  The attendants for this meeting include Meha Vidrine PA-C, patient, Taylor Blankenship and her daughter, Taylor Blankenship. During the encounter, Taylor Ahuja PA-C, was located at [facility].  Patient, Taylor Blankenship and her daughter, Taylor Blankenship, were located at home.   The time spent during this encounter was 20 minutes, including chart review, reviewing radiological studies, telephone discussion with the patient, entering orders and completing documentation.    Logyn Dedominicis  Celestia Khat, PA-C

## 2023-07-12 NOTE — Progress Notes (Signed)
Telephone nursing appointment for review of CT-Chest results. I verified patient's identity x2 and began nursing interview.   Patient reports doing well. Patient denies any related issues at this time.   Meaningful use complete.   Patient aware of their 10:30am-07/12/2023 telephone appointment w/ Ashlyn Bruning PA-C. I left my extension [phone] in case patient needs anything. Patient verbalized understanding. This concludes the nursing interview.   Patient contact [phone]     Ruel Favors, LPN

## 2023-07-31 NOTE — Progress Notes (Signed)
 [facility] [address] [place] [address] Phone: [phone] Subjective:    I'm seeing this patient by the request  of:  Loreli Elsie JONETTA Mickey., MD  CC: Right knee pain  YEP:Dlagzrupcz  Taylor Blankenship is a 89 y.o. female coming in with complaint of R knee pain. Last seen in October 2023. Patient states that her knee is doing fine, but daughter states pain is all the time, patient states biggest problem is getting up and down.     Reviewing patient's chart did have a recent CT scan of the chest done that did show an increased scarring and fibrosis from patient's previous therapy for breast cancer.  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   Complication of anesthesia    hard to wake up   Dermatophytosis of nail    DJD (degenerative joint disease)    History of shingles    HYPERTENSION    OSTEOPOROSIS    PALPITATIONS, RECURRENT    Past Surgical History:  Procedure Laterality Date   BREAST EXCISIONAL BIOPSY Left 2004   BREAST LUMPECTOMY WITH RADIOACTIVE SEED LOCALIZATION Left 07/02/2020   Procedure: LEFT BREAST LUMPECTOMY WITH RADIOACTIVE SEED LOCALIZATION;  Surgeon: Vanderbilt Ned, MD;  Location: [facility];  Service: General;  Laterality: Left;   COLONOSCOPY     IR ANGIO INTRA EXTRACRAN SEL COM CAROTID INNOMINATE BILAT MOD SED  11/24/2017   IR ANGIO VERTEBRAL SEL VERTEBRAL BILAT MOD SED  11/24/2017   Social History   Socioeconomic History   Marital status: Married    Spouse name: Not on file   Number of children: Not on file   Years of education: Not on file   Highest education level: Not on file  Occupational History   Not on file  Tobacco Use   Smoking status: Former    Current packs/day: 0.00    Average packs/day: 0.3 packs/day for 10.0 years (2.5 ttl pk-yrs)    Types: Cigarettes     Start date: 08/01/1958    Quit date: 08/01/1968    Years since quitting: 55.0   Smokeless tobacco: Former  Substance and Sexual Activity   Alcohol use: Yes    Comment: social   Drug use: No   Sexual activity: Not Currently    Birth control/protection: Post-menopausal  Other Topics Concern   Not on file  Social History Narrative   Not on file   Social Drivers of Health   Financial Resource Strain: Not on file  Food Insecurity: No Food Insecurity (01/04/2023)   Hunger Vital Sign    Worried About Running Out of Food in the Last Year: Never true    Ran Out of Food in the Last Year: Never true  Transportation Needs: No Transportation Needs (01/04/2023)   PRAPARE - Administrator, Civil Service (Medical): No    Lack of Transportation (Non-Medical): No  Physical Activity: Not on file  Stress: Not on file  Social Connections: Not on file   Allergies  Allergen Reactions   Amoxicillin Hives, Itching and Rash    Has patient had a PCN reaction causing immediate rash, facial/tongue/throat swelling, SOB or lightheadedness with hypotension: Yes Has patient had a PCN reaction causing severe rash involving mucus membranes or skin necrosis: No Has patient  had a PCN reaction that required hospitalization: No Has patient had a PCN reaction occurring within the last 10 years: No If all of the above answers are NO, then may proceed with Cephalosporin use.    Cephalexin Hives, Itching and Rash   Doxycycline  Rash   Family History  Problem Relation Age of Onset   Arthritis Other    Heart disease Other    Cancer Other        colon   Diabetes Other    Breast cancer Maternal Aunt      Current Outpatient Medications (Cardiovascular):    hydrochlorothiazide  (MICROZIDE ) 12.5 MG capsule, TAKE 1 CAPSULE BY MOUTH EVERY DAY     Current Outpatient Medications (Other):    calcium carbonate (OSCAL) 1500 (600 Ca) MG TABS tablet, Take by mouth daily. As directed   Cholecalciferol (VITAMIN  D) 1000 UNITS capsule, Take 1,000 Units by mouth daily.   Multiple Vitamin (MULTIVITAMIN) tablet, Take 1 tablet by mouth daily.   Omega-3 Fatty Acids (FISH OIL) 1000 MG CAPS, Take by mouth.   triamcinolone  cream (KENALOG ) 0.1 %, Apply 1 application. topically 2 (two) times daily.   Review of Systems:  No headache, visual changes, nausea, vomiting, diarrhea, constipation, dizziness, abdominal pain, skin rash, fevers, chills, night sweats, weight loss, swollen lymph nodes, body aches chest pain, shortness of breath, mood changes. POSITIVE muscle aches, joint swelling  Objective  Blood pressure 122/74, pulse 69, height 5' 3 (1.6 m), weight 186 lb (84.4 kg), SpO2 93%.   General: No apparent distress alert and oriented x3 mood and affect normal, dressed appropriately.  HEENT: Pupils equal, extraocular movements intact  Right knee exam shows arthritic changes noted.  Patient does have some venous pitting edema noted of the lower extremities right greater than left.    Impression and Recommendations:    The above documentation has been reviewed and is accurate and complete Taylor Forget M Fern Asmar, DO

## 2023-08-08 ENCOUNTER — Ambulatory Visit (INDEPENDENT_AMBULATORY_CARE_PROVIDER_SITE_OTHER): Payer: Medicare Other

## 2023-08-08 ENCOUNTER — Ambulatory Visit (INDEPENDENT_AMBULATORY_CARE_PROVIDER_SITE_OTHER): Payer: Medicare Other | Admitting: Family Medicine

## 2023-08-08 VITALS — BP 122/74 | HR 69 | Ht 63.0 in | Wt 186.0 lb

## 2023-08-08 DIAGNOSIS — M79604 Pain in right leg: Secondary | ICD-10-CM | POA: Diagnosis not present

## 2023-08-08 DIAGNOSIS — M1711 Unilateral primary osteoarthritis, right knee: Secondary | ICD-10-CM | POA: Diagnosis not present

## 2023-08-08 DIAGNOSIS — I872 Venous insufficiency (chronic) (peripheral): Secondary | ICD-10-CM

## 2023-08-08 NOTE — Patient Instructions (Signed)
 Great to see you as always We will get x-rays of the right knee and lower leg just to make sure. If it gets any worse would like to try a potential injection in the right knee but I understand your hesitation. Continue with the compression socks See me again in 3 to 6 months.

## 2023-08-08 NOTE — Assessment & Plan Note (Signed)
 Continues to have some venous insufficiency that I do think contributes to some of the pain but I do believe that some of the pain is also somewhat knee arthritis.  Discussed icing regimen and home exercises, discussed potential bracing which patient declined.  We have done the blood testing previously.  Will get a tib-fib x-ray to make sure nothing else is potentially going on at the moment.  Follow-up again in 6 to 8 weeks otherwise.

## 2023-09-12 ENCOUNTER — Telehealth: Payer: Self-pay | Admitting: *Deleted

## 2023-09-12 NOTE — Telephone Encounter (Signed)
CALLED PATIENT TO INFORM OF CT FOR 01-10-24- ARRIVAL TIME- 2:15 PM @ [facility], NO RESTRICTIONS TO SCAN, PATIENT TO RECEIVE RESULTS FROM ASHLYN BRUNING ON 01-17-24 VIA TELEPHONE @ 10:30 AM, SPOKE WITH PATIENT'S DAUGHTER- SANDRA AND SHE IS AWARE OF THESE APPTS. AND THE INSTRUCTIONS

## 2023-10-24 ENCOUNTER — Encounter: Payer: Self-pay | Admitting: Family Medicine

## 2023-10-25 ENCOUNTER — Telehealth: Payer: Self-pay

## 2023-10-25 NOTE — Telephone Encounter (Signed)
 Patient needs an appointment when medication is stocked.   Monovisc approved for bilateral knees. Deductible applies. Since the deductible has been meet, patient is responsible for a coinsurance. Prior authorization is not required. Confirmed with [employer] (ref # [id number]) Secondary insurance- The secondary plan follows Medicare guidelines. It will pick up eligible expenses at 100%. It does not cover the Medicare Part B deductible. Confirmed with [employer] (ref # [id number])

## 2023-10-26 NOTE — Telephone Encounter (Signed)
 Message sent to [facility]. Medication ordered.

## 2023-10-26 NOTE — Telephone Encounter (Signed)
 Bilateral Monovisc (see other phone note).  More has been ordered.

## 2023-10-31 ENCOUNTER — Ambulatory Visit: Admitting: Family Medicine

## 2023-10-31 ENCOUNTER — Encounter: Payer: Self-pay | Admitting: Family Medicine

## 2023-10-31 VITALS — BP 126/72 | HR 89 | Ht 63.0 in

## 2023-10-31 DIAGNOSIS — M1711 Unilateral primary osteoarthritis, right knee: Secondary | ICD-10-CM

## 2023-10-31 NOTE — Progress Notes (Signed)
 [facility] [address] [place] [address] Phone: [phone] Subjective:   INadine Counts, am serving as a scribe for Dr. Antoine Primas.  I'm seeing this patient by the request  of:  Taylor Blankenship., MD  CC:   Taylor:Blankenship  08/08/2023 Continues to have some venous insufficiency that I do think contributes to some of the pain but I do believe that some of the pain is also somewhat knee arthritis. Discussed icing regimen and home exercises, discussed potential bracing which patient declined. We have done the blood testing previously. Will get a tib-fib x-ray to make sure nothing else is potentially going on at the moment. Follow-up again in 6 to 8 weeks otherwise.   Updated 10/31/2023 Taylor Blankenship is a 89 y.o. female coming in with complaint of B knee pain. The R knee is the worst. Not sure if she needs it in L.       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   Complication of anesthesia    hard to wake up   Dermatophytosis of nail    DJD (degenerative joint disease)    History of shingles    HYPERTENSION    OSTEOPOROSIS    PALPITATIONS, RECURRENT    Past Surgical History:  Procedure Laterality Date   BREAST EXCISIONAL BIOPSY Left 2004   BREAST LUMPECTOMY WITH RADIOACTIVE SEED LOCALIZATION Left 07/02/2020   Procedure: LEFT BREAST LUMPECTOMY WITH RADIOACTIVE SEED LOCALIZATION;  Surgeon: Taylor Bouillon, MD;  Location: [facility];  Service: General;  Laterality: Left;   COLONOSCOPY     IR ANGIO INTRA EXTRACRAN SEL COM CAROTID INNOMINATE BILAT MOD SED  11/24/2017   IR ANGIO VERTEBRAL SEL VERTEBRAL BILAT MOD SED  11/24/2017   Social History   Socioeconomic History   Marital status: Married    Spouse name: Not on file   Number of children: Not on file   Years of education: Not on file    Highest education level: Not on file  Occupational History   Not on file  Tobacco Use   Smoking status: Former    Current packs/day: 0.00    Average packs/day: 0.3 packs/day for 10.0 years (2.5 ttl pk-yrs)    Types: Cigarettes    Start date: 08/01/1958    Quit date: 08/01/1968    Years since quitting: 55.2   Smokeless tobacco: Former  Substance and Sexual Activity   Alcohol use: Yes    Comment: social   Drug use: No   Sexual activity: Not Currently    Birth control/protection: Post-menopausal  Other Topics Concern   Not on file  Social History Narrative   Not on file   Social Drivers of Health   Financial Resource Strain: Not on file  Food Insecurity: No Food Insecurity (01/04/2023)   Hunger Vital Sign    Worried About Running Out of Food in the Last Year: Never true    Ran Out of Food in the Last Year: Never true  Transportation Needs: No Transportation Needs (01/04/2023)   PRAPARE - Administrator, Civil Service (Medical): No    Lack of Transportation (Non-Medical): No  Physical Activity: Not on file  Stress: Not on file  Social Connections: Not on file   Allergies  Allergen  Reactions   Amoxicillin Hives, Itching and Rash    Has patient had a PCN reaction causing immediate rash, facial/tongue/throat swelling, SOB or lightheadedness with hypotension: Yes Has patient had a PCN reaction causing severe rash involving mucus membranes or skin necrosis: No Has patient had a PCN reaction that required hospitalization: No Has patient had a PCN reaction occurring within the last 10 years: No If all of the above answers are "NO", then may proceed with Cephalosporin use.    Cephalexin Hives, Itching and Rash   Doxycycline Rash   Family History  Problem Relation Age of Onset   Arthritis Other    Heart disease Other    Cancer Other        colon   Diabetes Other    Breast cancer Maternal Aunt      Current Outpatient Medications (Cardiovascular):     hydrochlorothiazide (MICROZIDE) 12.5 MG capsule, TAKE 1 CAPSULE BY MOUTH EVERY DAY     Current Outpatient Medications (Other):    calcium carbonate (OSCAL) 1500 (600 Ca) MG TABS tablet, Take by mouth daily. As directed   Cholecalciferol (VITAMIN D) 1000 UNITS capsule, Take 1,000 Units by mouth daily.   Multiple Vitamin (MULTIVITAMIN) tablet, Take 1 tablet by mouth daily.   Omega-3 Fatty Acids (FISH OIL) 1000 MG CAPS, Take by mouth.   triamcinolone cream (KENALOG) 0.1 %, Apply 1 application. topically 2 (two) times daily.   Reviewed prior external information including notes and imaging from  primary care provider As well as notes that were available from care everywhere and other healthcare systems.  Past medical history, social, surgical and family history all reviewed in electronic medical record.  No pertanent information unless stated regarding to the chief complaint.   Review of Systems:  No headache, visual changes, nausea, vomiting, diarrhea, constipation, dizziness, abdominal pain, skin rash, fevers, chills, night sweats, weight loss, swollen lymph nodes, body aches, joint swelling, chest pain, shortness of breath, mood changes. POSITIVE muscle aches  Objective  There were no vitals taken for this visit.   General: No apparent distress alert and oriented x3 mood and affect normal, dressed appropriately.  HEENT: Pupils equal, extraocular movements intact  Respiratory: Patient's speak in full sentences and does not appear short of breath  Cardiovascular: No lower extremity edema, non tender, no erythema      Impression and Recommendations:

## 2023-10-31 NOTE — Progress Notes (Signed)
 [name] [address] [place] [address] Phone: [phone] Subjective:    I'm seeing this patient by the request  of:  [name]., MD  CC: Knee pain right greater than left  BJY:[id number]  Taylor Blankenship is a 89 y.o. female coming in with complaint of B knee pain. Patient states continues to have discomfort.  States that it is affecting daily activities.  Severe overall.       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   Complication of anesthesia    hard to wake up   Dermatophytosis of nail    DJD (degenerative joint disease)    History of shingles    HYPERTENSION    OSTEOPOROSIS    PALPITATIONS, RECURRENT    Past Surgical History:  Procedure Laterality Date   BREAST EXCISIONAL BIOPSY Left 2004   BREAST LUMPECTOMY WITH RADIOACTIVE SEED LOCALIZATION Left 07/02/2020   Procedure: LEFT BREAST LUMPECTOMY WITH RADIOACTIVE SEED LOCALIZATION;  Surgeon: Harriette Bouillon, MD;  Location: [facility];  Service: General;  Laterality: Left;   COLONOSCOPY     IR ANGIO INTRA EXTRACRAN SEL COM CAROTID INNOMINATE BILAT MOD SED  11/24/2017   IR ANGIO VERTEBRAL SEL VERTEBRAL BILAT MOD SED  11/24/2017   Social History   Socioeconomic History   Marital status: Married    Spouse name: Not on file   Number of children: Not on file   Years of education: Not on file   Highest education level: Not on file  Occupational History   Not on file  Tobacco Use   Smoking status: Former    Current packs/day: 0.00    Average packs/day: 0.3 packs/day for 10.0 years (2.5 ttl pk-yrs)    Types: Cigarettes    Start date: 08/01/1958    Quit date: 08/01/1968    Years since quitting: 55.2   Smokeless tobacco: Former  Substance and Sexual Activity   Alcohol use: Yes    Comment: social   Drug use: No   Sexual  activity: Not Currently    Birth control/protection: Post-menopausal  Other Topics Concern   Not on file  Social History Narrative   Not on file   Social Drivers of Health   Financial Resource Strain: Not on file  Food Insecurity: No Food Insecurity (01/04/2023)   Hunger Vital Sign    Worried About Running Out of Food in the Last Year: Never true    Ran Out of Food in the Last Year: Never true  Transportation Needs: No Transportation Needs (01/04/2023)   PRAPARE - Administrator, Civil Service (Medical): No    Lack of Transportation (Non-Medical): No  Physical Activity: Not on file  Stress: Not on file  Social Connections: Not on file   Allergies  Allergen Reactions   Amoxicillin Hives, Itching and Rash    Has patient had a PCN reaction causing immediate rash, facial/tongue/throat swelling, SOB or lightheadedness with hypotension: Yes Has patient had a PCN reaction causing severe rash involving mucus membranes or skin necrosis: No Has patient had a PCN reaction that required hospitalization: No Has patient had a PCN reaction occurring within the last 10 years: No If all of the above answers are "NO", then may proceed with Cephalosporin use.  Cephalexin Hives, Itching and Rash   Doxycycline Rash   Family History  Problem Relation Age of Onset   Arthritis Other    Heart disease Other    Cancer Other        colon   Diabetes Other    Breast cancer Maternal Aunt      Current Outpatient Medications (Cardiovascular):    hydrochlorothiazide (MICROZIDE) 12.5 MG capsule, TAKE 1 CAPSULE BY MOUTH EVERY DAY     Current Outpatient Medications (Other):    calcium carbonate (OSCAL) 1500 (600 Ca) MG TABS tablet, Take by mouth daily. As directed   Cholecalciferol (VITAMIN D) 1000 UNITS capsule, Take 1,000 Units by mouth daily.   Multiple Vitamin (MULTIVITAMIN) tablet, Take 1 tablet by mouth daily.   Omega-3 Fatty Acids (FISH OIL) 1000 MG CAPS, Take by mouth.    triamcinolone cream (KENALOG) 0.1 %, Apply 1 application. topically 2 (two) times daily.   Reviewed prior external information including notes and imaging from  primary care provider As well as notes that were available from care everywhere and other healthcare systems.  Past medical history, social, surgical and family history all reviewed in electronic medical record.  No pertanent information unless stated regarding to the chief complaint.   Review of Systems:  No headache, visual changes, nausea, vomiting, diarrhea, constipation, dizziness, abdominal pain, skin rash, fevers, chills, night sweats, weight loss, swollen lymph nodes, body aches, chest pain, shortness of breath, mood changes. POSITIVE muscle aches, joint swelling  Objective  Blood pressure 126/72, pulse 89, height 5\' 3"  (1.6 m), SpO2 95%.   General: No apparent distress alert and oriented x3 mood and affect normal, dressed appropriately.  HEENT: Pupils equal, extraocular movements intact  Respiratory: Patient's speak in full sentences and does not appear short of breath  Cardiovascular: No lower extremity edema, non tender, no erythema  Significant arthritic changes noted with some swelling noted to the right knee.  Arthritic changes noted at the left knee as well.  No crepitus noted.  After informed written and verbal consent, patient was seated on exam table. Right knee was prepped with alcohol swab and utilizing anterolateral approach, patient's right knee space was injected with 4:1  marcaine 0.5%: Kenalog 40mg /dL. Patient tolerated the procedure well without immediate complications.    Impression and Recommendations:    The above documentation has been reviewed and is accurate and complete Judi Saa, DO

## 2023-10-31 NOTE — Assessment & Plan Note (Signed)
 Given injection and tolerated the procedure well, discussed icing regimen and home exercises, discussed which activities to do and which ones to avoid.  Increase activity slowly otherwise.  Patient could be a candidate for viscosupplementation.  Follow-up with me again in 6 to 8 weeks otherwise.

## 2023-10-31 NOTE — Patient Instructions (Addendum)
 Injection in knee today Good to see you! See you again in 10-12 weeks

## 2024-01-10 ENCOUNTER — Ambulatory Visit (HOSPITAL_COMMUNITY)
Admission: RE | Admit: 2024-01-10 | Discharge: 2024-01-10 | Disposition: A | Discharge status: Home / Self Care | Payer: Medicare Other | Source: Ambulatory Visit | Attending: Urology | Admitting: Urology

## 2024-01-10 DIAGNOSIS — C3411 Malignant neoplasm of upper lobe, right bronchus or lung: Secondary | ICD-10-CM | POA: Diagnosis not present

## 2024-01-10 NOTE — Progress Notes (Signed)
 [facility] [address] [place] [address] Phone: [phone] Subjective:   Taylor Blankenship, am serving as a scribe for Dr. Ronnell Coins.  I'm seeing this patient by the request  of:  [name], MD  CC: Right knee pain  BJY:NWGNFAOZHY  10/31/2023 Given injection and tolerated the procedure well, discussed icing regimen and home exercises, discussed which activities to do and which ones to avoid. Increase activity slowly otherwise. Patient could be a candidate for viscosupplementation. Follow-up with me again in 6 to 8 weeks otherwise.   Updated 01/11/2024 Taylor Blankenship is a 89 y.o. female coming in with complaint of R knee pain, given injection 10 weeks ago. Patient states that she is doing well. Injection was helpful.       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   Complication of anesthesia    hard to wake up   Dermatophytosis of nail    DJD (degenerative joint disease)    History of shingles    HYPERTENSION    OSTEOPOROSIS    PALPITATIONS, RECURRENT    Past Surgical History:  Procedure Laterality Date   BREAST EXCISIONAL BIOPSY Left 2004   BREAST LUMPECTOMY WITH RADIOACTIVE SEED LOCALIZATION Left 07/02/2020   Procedure: LEFT BREAST LUMPECTOMY WITH RADIOACTIVE SEED LOCALIZATION;  Surgeon: Sim Dryer, MD;  Location: [facility];  Service: General;  Laterality: Left;   COLONOSCOPY     IR ANGIO INTRA EXTRACRAN SEL COM CAROTID INNOMINATE BILAT MOD SED  11/24/2017   IR ANGIO VERTEBRAL SEL VERTEBRAL BILAT MOD SED  11/24/2017   Social History   Socioeconomic History   Marital status: Married    Spouse name: Not on file   Number of children: Not on file   Years of education: Not on file   Highest education level: Not on file  Occupational History   Not on file  Tobacco Use    Smoking status: Former    Current packs/day: 0.00    Average packs/day: 0.3 packs/day for 10.0 years (2.5 ttl pk-yrs)    Types: Cigarettes    Start date: 08/01/1958    Quit date: 08/01/1968    Years since quitting: 55.4   Smokeless tobacco: Former  Substance and Sexual Activity   Alcohol use: Yes    Comment: social   Drug use: No   Sexual activity: Not Currently    Birth control/protection: Post-menopausal  Other Topics Concern   Not on file  Social History Narrative   Not on file   Social Drivers of Health   Financial Resource Strain: Not on file  Food Insecurity: No Food Insecurity (01/04/2023)   Hunger Vital Sign    Worried About Running Out of Food in the Last Year: Never true    Ran Out of Food in the Last Year: Never true  Transportation Needs: No Transportation Needs (01/04/2023)   PRAPARE - Administrator, Civil Service (Medical): No    Lack of Transportation (Non-Medical): No  Physical Activity: Not on file  Stress: Not on file  Social Connections: Not on file   Allergies  Allergen Reactions   Amoxicillin Hives, Itching and Rash    Has patient had a PCN reaction causing immediate rash, facial/tongue/throat swelling, SOB or lightheadedness with hypotension:  Yes Has patient had a PCN reaction causing severe rash involving mucus membranes or skin necrosis: No Has patient had a PCN reaction that required hospitalization: No Has patient had a PCN reaction occurring within the last 10 years: No If all of the above answers are NO, then may proceed with Cephalosporin use.    Cephalexin Hives, Itching and Rash   Doxycycline  Rash   Family History  Problem Relation Age of Onset   Arthritis Other    Heart disease Other    Cancer Other        colon   Diabetes Other    Breast cancer Maternal Aunt      Current Outpatient Medications (Cardiovascular):    hydrochlorothiazide  (MICROZIDE ) 12.5 MG capsule, TAKE 1 CAPSULE BY MOUTH EVERY DAY     Current  Outpatient Medications (Other):    calcium carbonate (OSCAL) 1500 (600 Ca) MG TABS tablet, Take by mouth daily. As directed   Cholecalciferol (VITAMIN D) 1000 UNITS capsule, Take 1,000 Units by mouth daily.   Multiple Vitamin (MULTIVITAMIN) tablet, Take 1 tablet by mouth daily.   Omega-3 Fatty Acids (FISH OIL) 1000 MG CAPS, Take by mouth.   triamcinolone  cream (KENALOG ) 0.1 %, Apply 1 application. topically 2 (two) times daily.   Reviewed prior external information including notes and imaging from  primary care provider As well as notes that were available from care everywhere and other healthcare systems.  Past medical history, social, surgical and family history all reviewed in electronic medical record.  No pertanent information unless stated regarding to the chief complaint.   Review of Systems:  No headache, visual changes, nausea, vomiting, diarrhea, constipation, dizziness, abdominal pain, skin rash, fevers, chills, night sweats, weight loss, swollen lymph nodes, body aches, joint swelling, chest pain, shortness of breath, mood changes. POSITIVE muscle aches  Objective  Blood pressure 124/82, pulse 82, height 5' 3 (1.6 m), weight 181 lb (82.1 kg), SpO2 98%.   General: No apparent distress alert and oriented x3 mood and affect normal, dressed appropriately.  HEENT: Pupils equal, extraocular movements intact  Respiratory: Patient's speak in full sentences and does not appear short of breath  Cardiovascular: No lower extremity edema, non tender, no erythema  Knee exam shows arthritic changes noted.  Does have trace effusion noted of the patellofemoral joint with lateral tracking of the patella noted.  After informed written and verbal consent, patient was seated on exam table. Right knee was prepped with alcohol swab and utilizing anterolateral approach, patient's right knee space was injected with 4:1  marcaine  0.5%: Kenalog  40mg /dL. Patient tolerated the procedure well without  immediate complications.    Impression and Recommendations:    The above documentation has been reviewed and is accurate and complete Taylor Statzer M Shahzaib Azevedo, DO

## 2024-01-11 ENCOUNTER — Ambulatory Visit: Admitting: Family Medicine

## 2024-01-11 ENCOUNTER — Encounter: Payer: Self-pay | Admitting: Family Medicine

## 2024-01-11 VITALS — BP 124/82 | HR 82 | Ht 63.0 in | Wt 181.0 lb

## 2024-01-11 DIAGNOSIS — M1711 Unilateral primary osteoarthritis, right knee: Secondary | ICD-10-CM | POA: Diagnosis not present

## 2024-01-11 NOTE — Patient Instructions (Signed)
 Injected R knee today See me again in 10-12 weeks

## 2024-01-11 NOTE — Assessment & Plan Note (Signed)
 Patient given injection and tolerated the procedure well, discussed icing regimen of home exercises, discussed which activities to do and which ones to avoid.  Increase activity slowly.  Discussed icing regimen.  Patient has been doing much better since the first injection.  Could be candidate for viscosupplementation if necessary.

## 2024-01-16 ENCOUNTER — Encounter: Payer: Self-pay | Admitting: Urology

## 2024-01-16 NOTE — Progress Notes (Signed)
 Telephone nursing appointment for review of most recent CT-Chest results. I verified patient's identity x2 and began nursing interview.   Patient states issues as follows...  -Pain: Denies -Fatigue: Mild -Skin: Denies -Lungs: Mild wheezing -Appetite: Good   Patient denies any other related issues at this time.   Meaningful use complete.   Patient aware of their telephone appointment w/ Ashlyn Bruning PA-C. I left my extension [phone] in case patient needs anything. Patient verbalized understanding. This concludes the nursing interview.   Patient preferred phone # [phone]   Avery Bodo, LPN

## 2024-01-17 ENCOUNTER — Ambulatory Visit
Admission: RE | Admit: 2024-01-17 | Discharge: 2024-01-17 | Disposition: A | Discharge status: Home / Self Care | Payer: Medicare Other | Source: Ambulatory Visit | Attending: Urology | Admitting: Urology

## 2024-01-17 VITALS — Ht 63.0 in | Wt 181.0 lb

## 2024-01-17 DIAGNOSIS — C3411 Malignant neoplasm of upper lobe, right bronchus or lung: Secondary | ICD-10-CM

## 2024-01-17 NOTE — Progress Notes (Signed)
 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  Interval Since Last Radiation: 2.5 years  06/16/21 - 06/22/21: SBRT// The target in the RUL lung was treated to 54 Gy in 3 fractions of 18 Gy  Narrative: I spoke with the patient to conduct her routine scheduled follow up visit via telephone to review the results of her recent posttreatment CT Chest scan to spare the patient unnecessary potential exposure in the healthcare setting during the current COVID-19 pandemic.  The patient was notified in advance and gave permission to proceed with this visit format.  She tolerated radiation treatment well without any ill effects aside from fatigue and persistent shortness of breath with exertion.  Her initial posttreatment CT chest scan on 08/10/2021 showed a stable appearance of the treated right upper lobe lung nodule with surrounding radiation changes and no evidence of metastatic disease.  The hypermetabolic left axillary/subpectoral lymph node, seen on prior PET imaging had decreased in size, measuring 4 mm as compared to 8 mm previously.  Her follow up CT Chest scans had continued to show progressive radiation fibrosis in the RUL lung. She and her daughter were quite concerned with the progressive fibrosis so I referred her back to Dr. Thelda Finney in pulmonology for his opinion. He felt that these were expected post-treatment changes only and since she was not having any progressive or activity limiting symptomology, no further medical management was warranted, just continued monitoring. She follows up today to review the results of her most recent CT Chest from 01/10/24 which shows stability of her treated disease with no evidence  of recurrent or residual disease within the chest and stable post-treatment changes in the RUL lung. We reviewed these results today.            On review of systems, the patient states that she has continued doing well in general. She continues with baseline shortness of breath on exertion and fatigue that has not worsened since the time of treatment, She has also continued with a dry, hacking cough, mainly at night, that is slightly improved and certainly not worsening. She specifically denies productive cough, hemoptysis, chest pain or increased shortness of breath and has not had fever, chills or night sweats. She has continued with a good appetite and is maintaining her weight. She is pleased to hear that the post-treatment fibrosis is not continuing to progress and overall, remains pleased with her progress to date.  ALLERGIES:  is allergic to amoxicillin, cephalexin, and doxycycline .  Meds: Current Outpatient Medications  Medication Sig Dispense Refill   calcium carbonate (OSCAL) 1500 (600 Ca) MG TABS tablet Take by mouth daily. As directed     Cholecalciferol (VITAMIN D) 1000 UNITS capsule Take 1,000 Units by mouth daily.     hydrochlorothiazide  (MICROZIDE ) 12.5 MG capsule TAKE 1 CAPSULE BY MOUTH EVERY DAY 100 capsule 4   Multiple Vitamin (MULTIVITAMIN) tablet Take 1 tablet by mouth daily.     Omega-3 Fatty Acids (FISH OIL) 1000 MG CAPS Take by mouth.     triamcinolone  cream (KENALOG ) 0.1 % Apply 1 application. topically 2 (two) times daily.     No current facility-administered medications for this  encounter.    Physical Findings:  height is 5' 3 (1.6 m) and weight is 181 lb (82.1 kg).  Pain Assessment Pain Score: 0-No pain/10 Unable to assess due to telephone follow up visit format.  Lab Findings: Lab Results  Component Value Date   WBC 6.5 06/29/2020   HGB 14.5 06/29/2020   HCT 43.5 06/29/2020   MCV 91.4 06/29/2020   PLT 204 06/29/2020     Radiographic Findings: CT  Chest Wo Contrast Result Date: 01/16/2024 CLINICAL DATA:  Right upper lobe non-small cell lung cancer status post SBRT 06/2021. Surveillance. * Tracking Code: BO * EXAM: CT CHEST WITHOUT CONTRAST TECHNIQUE: Multidetector CT imaging of the chest was performed following the standard protocol without IV contrast. RADIATION DOSE REDUCTION: This exam was performed according to the departmental dose-optimization program which includes automated exposure control, adjustment of the mA and/or kV according to patient size and/or use of iterative reconstruction technique. COMPARISON:  CT chest dated 07/06/2023 FINDINGS: Cardiovascular: Normal heart size. No significant pericardial fluid/thickening. Great vessels are normal in course and caliber. Aortic atherosclerosis. Mitral annular calcifications. Mediastinum/Nodes: Imaged thyroid  gland without nodules meeting criteria for imaging follow-up by size. Normal esophagus. No pathologically enlarged axillary, supraclavicular, mediastinal, or hilar lymph nodes. Lungs/Pleura: The central airways are patent. Unchanged rightward deviation of the trachea. Similar masslike consolidation involving the anteromedial right apex in keeping with post radiation changes. Unchanged pleuroparenchymal scarring along the anteromedial left apex. Scattered subsegmental mucous plugging. No pneumothorax. No pleural effusion. Upper abdomen: Normal. Unchanged small fat containing medial left hemidiaphragmatic hernia. Musculoskeletal: No acute or abnormal lytic or blastic osseous lesions. Multilevel degenerative changes of the thoracic spine. IMPRESSION: 1. Similar post radiation changes involving the anteromedial right apex. No evidence of recurrent or metastatic disease in the chest. 2.  Aortic Atherosclerosis (ICD10-I70.0). Electronically Signed   By: Limin  Xu M.D.   On: 01/16/2024 19:51    Impression/Plan: 1. 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 She appears to have recovered well from the effects of the SBRT.  She continues with a dry, hacking cough that is slightly improved and her recent posttreatment CT chest scan shows stability of her treated disease with no evidence of recurrent or residual disease within the chest and stable post-treatment changes in the RUL lung. Therefore, the recommendation is to continue to monitor with serial CT Chest scans every 6 months for the next 2.5 years and pending her disease remains stable, we would then move to annual, low dose CT Chest scans thereafter. She and her daughter, Adell Hones, appear to have a good understanding of these recommendations but prefer to move to annual CT Chest scans at this time.  They know that they are welcome to call at anytime in the interim with any questions or concerns but otherwise, I will plan to follow up with them by telephone following her next scan in 12/2024.  Given current concerns for patient exposure during the COVID-19 pandemic, this encounter was conducted via telephone. The patient was notified in advance and has given verbal consent for this type of encounter.  The attendants for this meeting include Enzley Kitchens PA-C, patient, Ramona Lankford and her daughter, Adell Hones. During the encounter, Betsie Peckman PA-C, was located at [facility].  Patient, Ramona Marston and her daughter, Adell Hones, were located at home.   The time spent during this encounter was 20 minutes, including chart review,  reviewing radiological studies, telephone discussion with the patient, entering orders and completing documentation.    Arta Bihari, PA-C

## 2024-01-23 ENCOUNTER — Ambulatory Visit: Payer: Medicare Other | Admitting: Family Medicine

## 2024-01-23 ENCOUNTER — Encounter: Payer: Self-pay | Admitting: Family Medicine

## 2024-01-25 NOTE — Progress Notes (Signed)
   LILLETTE Ileana Collet, PhD, LAT, ATC acting as a scribe for Artist Lloyd, MD.  Nathalie E Rivere is a 89 y.o. female who presents to [facility] today for cont'd R knee pain. Pt was last seen by Dr. Claudene on 01/11/24 and was given a R knee steroid injection.  Today, pt reports she started having pain along the medial aspect of her knee that worsened since her last visit w/ Dr. Claudene. No falls or injury. Pain has improved somewhat, but is not all better. Pain is located along the anterior-medial aspect of the R knee, slightly distal, around the pes anserine.  Dx imaging: 08/08/23 R knee & tib/fib XR  Pertinent review of systems: No fevers or chills  Relevant historical information: Radiation-induced pulmonary fibrosis osteoporosis breast cancer.   Exam:  BP (!) 146/82   Pulse 67   Ht 5' 3 (1.6 m)   SpO2 96%   BMI 32.06 kg/m  General: Well Developed, well nourished, and in no acute distress.   MSK: Right knee minimal swelling along medial aspect of knee.  Not particularly tender palpation normal motion.    Lab and Radiology Results  Diagnostic Limited MSK Ultrasound of: Right knee Medial joint line is mildly degenerative. Patient does have a small pes anserine bursitis. Impression: Pes anserine bursitis   EXAM: RIGHT KNEE - COMPLETE 4+ VIEW   COMPARISON:  None Available.   FINDINGS: Mild diffuse decreased bone mineralization. There is tricompartmental osteoarthritic change worse over the lateral compartment and patellofemoral joints. No significant joint effusion. No fracture or dislocation. No focal bony abnormality.   IMPRESSION: 1. No acute findings. 2. Moderate tricompartmental osteoarthritic change worse over the lateral compartment and patellofemoral joints.     Electronically Signed   By: Toribio Agreste M.D.   On: 08/14/2023 12:55 I, Artist Lloyd, personally (independently) visualized and performed the interpretation of the images  attached in this note.    Assessment and Plan: 89 y.o. female with right knee pain.  Patient had a steroid injection about 2 weeks ago with Dr. Claudene.  Last week she had a flareup of pain along the medial proximal tibia.  She does not have much arthritis in this region.  Ultrasound today shows mild pes anserine bursitis.  Diagnosis is resolving bursitis.  Plan for Voltaren gel and watchful waiting if needed if worsening consider steroid injection at this location.   PDMP not reviewed this encounter. Orders Placed This Encounter  Procedures   US  LIMITED JOINT SPACE STRUCTURES LOW RIGHT(NO LINKED CHARGES)    Reason for Exam (SYMPTOM  OR DIAGNOSIS REQUIRED):   right knee pain    Preferred imaging location?:   [facility]   No orders of the defined types were placed in this encounter.    Discussed warning signs or symptoms. Please see discharge instructions. Patient expresses understanding.   The above documentation has been reviewed and is accurate and complete Artist Lloyd, M.D.

## 2024-01-26 ENCOUNTER — Other Ambulatory Visit: Payer: Self-pay

## 2024-01-26 ENCOUNTER — Ambulatory Visit (INDEPENDENT_AMBULATORY_CARE_PROVIDER_SITE_OTHER): Admitting: Family Medicine

## 2024-01-26 VITALS — BP 146/82 | HR 67 | Ht 63.0 in

## 2024-01-26 DIAGNOSIS — G8929 Other chronic pain: Secondary | ICD-10-CM

## 2024-01-26 DIAGNOSIS — M1711 Unilateral primary osteoarthritis, right knee: Secondary | ICD-10-CM

## 2024-01-26 DIAGNOSIS — M25561 Pain in right knee: Secondary | ICD-10-CM

## 2024-01-26 NOTE — Patient Instructions (Addendum)
 Thank you for coming in today.   Pes anserine bursitis  Check back as needed  Please use Voltaren gel (Generic Diclofenac Gel) up to 4x daily for pain as needed.  This is available over-the-counter as both the name brand Voltaren gel and the generic diclofenac gel.

## 2024-02-19 ENCOUNTER — Telehealth: Payer: Self-pay | Admitting: *Deleted

## 2024-02-19 NOTE — Telephone Encounter (Signed)
XXXX 

## 2024-02-19 NOTE — Telephone Encounter (Signed)
 CALLED PATIENT TO INFORM OF CT FOR 01-13-25- ARRIVAL TIME- 12:45 PM @ [facility], NO RESTRICTIONS TO SCAN, PATIENT TO RECEIVE RESULTS FROM ASHLYN BRUNING ON 01-22-25 @ 11 AM VIA TELEPHONE, LVM FOR A RETURN CALL

## 2024-03-22 NOTE — Progress Notes (Signed)
 [facility] [address] [place] [address] Phone: [phone] Subjective:   LILLETTE Berwyn Posey, am serving as a scribe for Dr. Arthea Claudene.  I'm seeing this patient by the request  of:  Loreli Elsie JONETTA Mickey., MD  CC: Right knee pain  YEP:Dlagzrupcz  01/11/2024 Patient given injection and tolerated the procedure well, discussed icing regimen of home exercises, discussed which activities to do and which ones to avoid.  Increase activity slowly.  Discussed icing regimen.  Patient has been doing much better since the first injection.  Could be candidate for viscosupplementation if necessary.     Update 03/25/2024 KORIANA STEPIEN is a 89 y.o. female coming in with complaint of R knee pain. Patient states injection was helpful. Having pain in patellar tendon. Having hard time spinning bike around.       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   Complication of anesthesia    hard to wake up   Dermatophytosis of nail    DJD (degenerative joint disease)    History of shingles    HYPERTENSION    OSTEOPOROSIS    PALPITATIONS, RECURRENT    Past Surgical History:  Procedure Laterality Date   BREAST EXCISIONAL BIOPSY Left 2004   BREAST LUMPECTOMY WITH RADIOACTIVE SEED LOCALIZATION Left 07/02/2020   Procedure: LEFT BREAST LUMPECTOMY WITH RADIOACTIVE SEED LOCALIZATION;  Surgeon: Vanderbilt Ned, MD;  Location: [facility];  Service: General;  Laterality: Left;   COLONOSCOPY     IR ANGIO INTRA EXTRACRAN SEL COM CAROTID INNOMINATE BILAT MOD SED  11/24/2017   IR ANGIO VERTEBRAL SEL VERTEBRAL BILAT MOD SED  11/24/2017   Social History   Socioeconomic History   Marital status: Married    Spouse name: Not on file   Number of children: Not on file   Years of education: Not on file   Highest education level: Not on  file  Occupational History   Not on file  Tobacco Use   Smoking status: Former    Current packs/day: 0.00    Average packs/day: 0.3 packs/day for 10.0 years (2.5 ttl pk-yrs)    Types: Cigarettes    Start date: 08/01/1958    Quit date: 08/01/1968    Years since quitting: 55.6   Smokeless tobacco: Former  Substance and Sexual Activity   Alcohol use: Yes    Comment: social   Drug use: No   Sexual activity: Not Currently    Birth control/protection: Post-menopausal  Other Topics Concern   Not on file  Social History Narrative   Not on file   Social Drivers of Health   Financial Resource Strain: Not on file  Food Insecurity: No Food Insecurity (01/16/2024)   Hunger Vital Sign    Worried About Running Out of Food in the Last Year: Never true    Ran Out of Food in the Last Year: Never true  Transportation Needs: No Transportation Needs (01/16/2024)   PRAPARE - Administrator, Civil Service (Medical): No    Lack of Transportation (Non-Medical): No  Physical Activity: Not on file  Stress: Not on file  Social Connections: Not on file   Allergies  Allergen Reactions   Amoxicillin Hives, Itching and Rash    Has patient had a PCN reaction  causing immediate rash, facial/tongue/throat swelling, SOB or lightheadedness with hypotension: Yes Has patient had a PCN reaction causing severe rash involving mucus membranes or skin necrosis: No Has patient had a PCN reaction that required hospitalization: No Has patient had a PCN reaction occurring within the last 10 years: No If all of the above answers are NO, then may proceed with Cephalosporin use.    Cephalexin Hives, Itching and Rash   Doxycycline  Rash   Family History  Problem Relation Age of Onset   Arthritis Other    Heart disease Other    Cancer Other        colon   Diabetes Other    Breast cancer Maternal Aunt      Current Outpatient Medications (Cardiovascular):    hydrochlorothiazide  (MICROZIDE ) 12.5 MG  capsule, TAKE 1 CAPSULE BY MOUTH EVERY DAY     Current Outpatient Medications (Other):    calcium carbonate (OSCAL) 1500 (600 Ca) MG TABS tablet, Take by mouth daily. As directed   Cholecalciferol (VITAMIN D) 1000 UNITS capsule, Take 1,000 Units by mouth daily.   Multiple Vitamin (MULTIVITAMIN) tablet, Take 1 tablet by mouth daily.   Omega-3 Fatty Acids (FISH OIL) 1000 MG CAPS, Take by mouth.   triamcinolone  cream (KENALOG ) 0.1 %, Apply 1 application. topically 2 (two) times daily.   Reviewed prior external information including notes and imaging from  primary care provider As well as notes that were available from care everywhere and other healthcare systems.  Past medical history, social, surgical and family history all reviewed in electronic medical record.  No pertanent information unless stated regarding to the chief complaint.   Review of Systems:  No headache, visual changes, nausea, vomiting, diarrhea, constipation, dizziness, abdominal pain, skin rash, fevers, chills, night sweats, weight loss, swollen lymph nodes, body aches, joint swelling, chest pain, shortness of breath, mood changes. POSITIVE muscle aches  Objective  Blood pressure 122/82, pulse 86, height 5' 3 (1.6 m), SpO2 96%.   General: No apparent distress alert  Right knee does have some crepitus noted.  Trace in the fusion of the patellofemoral joint.  Lacks the last 10 degrees of flexion in the last 5 degrees of extension.   After informed written and verbal consent, patient was seated on exam table. Right knee was prepped with alcohol swab and utilizing anterolateral approach, patient's right knee space was injected with 4:1  marcaine  0.5%: Kenalog  40mg /dL. Patient tolerated the procedure well without immediate complications.   Impression and Recommendations:     The above documentation has been reviewed and is accurate and complete Hafsah Hendler M Maria Coin, DO

## 2024-03-25 ENCOUNTER — Ambulatory Visit (INDEPENDENT_AMBULATORY_CARE_PROVIDER_SITE_OTHER): Admitting: Family Medicine

## 2024-03-25 ENCOUNTER — Encounter: Payer: Self-pay | Admitting: Family Medicine

## 2024-03-25 VITALS — BP 122/82 | HR 86 | Ht 63.0 in

## 2024-03-25 DIAGNOSIS — M1711 Unilateral primary osteoarthritis, right knee: Secondary | ICD-10-CM | POA: Diagnosis not present

## 2024-03-25 NOTE — Assessment & Plan Note (Addendum)
 Continues to do relatively well, started to limp again per family members.  Patient is having a little more trouble with memory at the moment.  Discussing regimen of home exercises, discussed which activities to do and which ones to avoid.  Increase activity slowly.  Follow-up again in 3 months.  Due to patient's comorbidities and patient's age not a surgical candidate.

## 2024-06-12 ENCOUNTER — Encounter: Payer: Self-pay | Admitting: Family Medicine

## 2024-06-12 NOTE — Progress Notes (Unsigned)
 [facility] [address] [place] [address] Phone: [phone] Subjective:   Taylor Blankenship, am serving as a scribe for Dr. Arthea Blankenship.  I'm seeing this patient by the request  of:  Taylor Blankenship., MD  CC: Shoulder pain  YEP:Dlagzrupcz  Taylor Blankenship is a 89 y.o. female coming in with complaint of R shoulder pain. Patient states pain started about 3 weeks in the middle of the night. Cannot sleep at night. Hurts to move.      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   Complication of anesthesia    hard to wake up   Dermatophytosis of nail    DJD (degenerative joint disease)    History of shingles    HYPERTENSION    OSTEOPOROSIS    PALPITATIONS, RECURRENT    Past Surgical History:  Procedure Laterality Date   BREAST EXCISIONAL BIOPSY Left 2004   BREAST LUMPECTOMY WITH RADIOACTIVE SEED LOCALIZATION Left 07/02/2020   Procedure: LEFT BREAST LUMPECTOMY WITH RADIOACTIVE SEED LOCALIZATION;  Surgeon: Vanderbilt Ned, MD;  Location: [facility];  Service: General;  Laterality: Left;   COLONOSCOPY     IR ANGIO INTRA EXTRACRAN SEL COM CAROTID INNOMINATE BILAT MOD SED  11/24/2017   IR ANGIO VERTEBRAL SEL VERTEBRAL BILAT MOD SED  11/24/2017   Social History   Socioeconomic History   Marital status: Married    Spouse name: Not on file   Number of children: Not on file   Years of education: Not on file   Highest education level: Not on file  Occupational History   Not on file  Tobacco Use   Smoking status: Former    Current packs/day: 0.00    Average packs/day: 0.3 packs/day for 10.0 years (2.5 ttl pk-yrs)    Types: Cigarettes    Start date: 08/01/1958    Quit date: 08/01/1968    Years since quitting: 55.9   Smokeless tobacco: Former  Substance and Sexual Activity   Alcohol use: Yes     Comment: social   Drug use: No   Sexual activity: Not Currently    Birth control/protection: Post-menopausal  Other Topics Concern   Not on file  Social History Narrative   Not on file   Social Drivers of Health   Financial Resource Strain: Not on file  Food Insecurity: No Food Insecurity (01/16/2024)   Hunger Vital Sign    Worried About Running Out of Food in the Last Year: Never true    Ran Out of Food in the Last Year: Never true  Transportation Needs: No Transportation Needs (01/16/2024)   PRAPARE - Administrator, Civil Service (Medical): No    Lack of Transportation (Non-Medical): No  Physical Activity: Not on file  Stress: Not on file  Social Connections: Not on file   Allergies  Allergen Reactions   Amoxicillin Hives, Itching and Rash    Has patient had a PCN reaction causing immediate rash, facial/tongue/throat swelling, SOB or lightheadedness with hypotension: Yes Has patient had a PCN reaction causing severe rash involving mucus membranes or skin necrosis: No Has patient had a PCN reaction that required hospitalization: No Has patient had a PCN reaction occurring within the last 10 years: No If all of the above answers  are NO, then may proceed with Cephalosporin use.    Cephalexin Hives, Itching and Rash   Doxycycline  Rash   Family History  Problem Relation Age of Onset   Arthritis Other    Heart disease Other    Cancer Other        colon   Diabetes Other    Breast cancer Maternal Aunt      Current Outpatient Medications (Cardiovascular):    hydrochlorothiazide  (MICROZIDE ) 12.5 MG capsule, TAKE 1 CAPSULE BY MOUTH EVERY DAY     Current Outpatient Medications (Other):    calcium carbonate (OSCAL) 1500 (600 Ca) MG TABS tablet, Take by mouth daily. As directed   Cholecalciferol (VITAMIN D) 1000 UNITS capsule, Take 1,000 Units by mouth daily.   Multiple Vitamin (MULTIVITAMIN) tablet, Take 1 tablet by mouth daily.   Omega-3 Fatty Acids (FISH  OIL) 1000 MG CAPS, Take by mouth.   triamcinolone  cream (KENALOG ) 0.1 %, Apply 1 application. topically 2 (two) times daily.   Reviewed prior external information including notes and imaging from  primary care provider As well as notes that were available from care everywhere and other healthcare systems.  Past medical history, social, surgical and family history all reviewed in electronic medical record.  No pertanent information unless stated regarding to the chief complaint.   Review of Systems:  No headache, visual changes, nausea, vomiting, diarrhea, constipation, dizziness, abdominal pain, skin rash, fevers, chills, night sweats, weight loss, swollen lymph nodes, body aches, joint swelling, chest pain, shortness of breath, mood changes. POSITIVE muscle aches  Objective  There were no vitals taken for this visit.   General: No apparent distress alert and oriented x3 mood and affect normal, dressed appropriately.  HEENT: Pupils equal, extraocular movements intact  Respiratory: Patient's speak in full sentences and does not appear short of breath  Cardiovascular: No lower extremity edema, non tender, no erythema  Shoulder exam shows positive impingement noted.  Positive Neer and Hawkins.  Rotator cuff appears to be intact. Otalgic gait favoring the right knee.  Arthritic changes noted.  Limited muscular skeletal ultrasound was performed and interpreted by Taylor Blankenship, M  Limited ultrasound shows that patient does have some hypoechoic changes noted. Patient has what appears to be more of a subacromial bursitis noted.  After informed written and verbal consent, patient was seated on exam table. Right knee was prepped with alcohol swab and utilizing anterolateral approach, patient's right knee space was injected with 4:1  marcaine  0.5%: Kenalog  40mg /dL. Patient tolerated the procedure well without immediate complications.   Procedure: Real-time Ultrasound Guided Injection of right  acromial space  evice: GE Logiq Q7  Ultrasound guided injection is preferred based studies that show increased duration, increased effect, greater accuracy, decreased procedural pain, increased response rate with ultrasound guided versus blind injection.  Verbal informed consent obtained.  Time-out conducted.  Noted no overlying erythema, induration, or other signs of local infection.  Skin prepped in a sterile fashion.  Local anesthesia: Topical Ethyl chloride.  With sterile technique and under real time ultrasound guidance:  Joint visualized.  23g 1  inch needle inserted lateral approach. Pictures taken for needle placement. Patient did have injection of 2 cc of 1% lidocaine , 2 cc of 0.5% Marcaine , and 1.0 cc of Kenalog  40 mg/dL. Completed without difficulty  Pain immediately resolved suggesting accurate placement of the medication.  Advised to call if fevers/chills, erythema, induration, drainage, or persistent bleeding.  Images saved Impression: Technically successful ultrasound guided injection.   Impression and  Recommendations:     The above documentation has been reviewed and is accurate and complete Ava Tangney M Babyboy Loya, DO

## 2024-06-14 ENCOUNTER — Other Ambulatory Visit: Payer: Self-pay

## 2024-06-14 ENCOUNTER — Encounter: Payer: Self-pay | Admitting: Family Medicine

## 2024-06-14 ENCOUNTER — Ambulatory Visit: Admitting: Family Medicine

## 2024-06-14 ENCOUNTER — Ambulatory Visit (INDEPENDENT_AMBULATORY_CARE_PROVIDER_SITE_OTHER)

## 2024-06-14 VITALS — BP 118/76 | HR 95 | Ht 63.0 in

## 2024-06-14 DIAGNOSIS — M7551 Bursitis of right shoulder: Secondary | ICD-10-CM | POA: Diagnosis not present

## 2024-06-14 DIAGNOSIS — M25511 Pain in right shoulder: Secondary | ICD-10-CM

## 2024-06-14 DIAGNOSIS — M1711 Unilateral primary osteoarthritis, right knee: Secondary | ICD-10-CM | POA: Diagnosis not present

## 2024-06-14 NOTE — Assessment & Plan Note (Addendum)
 You go discussed HEP we discussed avoiding certain activities.  Patient has no significant arthritic changes and we discussed otherwise custom bracing.  Patient would due to age she does not want to have any type of surgical intervention.  Follow-up again in 10 to 12 weeks

## 2024-06-14 NOTE — Patient Instructions (Addendum)
 Good to see you! Happy Holidays See you again in 3 months (reschedule 11/25 appointment)

## 2024-06-14 NOTE — Assessment & Plan Note (Signed)
 Patient given injection and tolerated the procedure well, discussed icing regimen and home exercises, discussed which activities to do and which ones to avoid.  I do believe that there is some underlying arthritic changes.  Does have some signs that could be more of acromioclavicular joint as well.  Follow-up with me again in 6 to 12 weeks otherwise

## 2024-06-17 ENCOUNTER — Ambulatory Visit: Payer: Self-pay | Admitting: Family Medicine

## 2024-06-25 ENCOUNTER — Ambulatory Visit: Admitting: Family Medicine

## 2024-08-21 NOTE — Progress Notes (Unsigned)
 " [facility] [address] [place] [address] Phone: [phone] Subjective:   Taylor Blankenship, am serving as a scribe for Dr. Arthea Claudene.  I'm seeing this patient by the request  of:  Loreli Elsie JONETTA Mickey., MD  CC: Right shoulder and knee pain  YEP:Dlagzrupcz  06/14/2024 Patient given injection and tolerated the procedure well, discussed icing regimen and home exercises, discussed which activities to do and which ones to avoid.  I do believe that there is some underlying arthritic changes.  Does have some signs that could be more of acromioclavicular joint as well.  Follow-up with me again in 6 to 12 weeks otherwise   You go discussed HEP we discussed avoiding certain activities.  Patient has no significant arthritic changes and we discussed otherwise custom bracing.  Patient would due to age she does not want to have any type of surgical intervention.  Follow-up again in 10 to 12 weeks     Updated 08/23/2024 Taylor Blankenship is a 89 y.o. female coming in with complaint of R shoulder and knee pain. Shoulder is okay during the day but hurts at night. Injections do help with knee and shoulder joint.       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   Complication of anesthesia    hard to wake up   Dermatophytosis of nail    DJD (degenerative joint disease)    History of shingles    HYPERTENSION    OSTEOPOROSIS    PALPITATIONS, RECURRENT    Past Surgical History:  Procedure Laterality Date   BREAST EXCISIONAL BIOPSY Left 2004   BREAST LUMPECTOMY WITH RADIOACTIVE SEED LOCALIZATION Left 07/02/2020   Procedure: LEFT BREAST LUMPECTOMY WITH RADIOACTIVE SEED LOCALIZATION;  Surgeon: Vanderbilt Ned, MD;  Location: [facility];  Service: General;  Laterality: Left;   COLONOSCOPY     IR ANGIO INTRA EXTRACRAN  SEL COM CAROTID INNOMINATE BILAT MOD SED  11/24/2017   IR ANGIO VERTEBRAL SEL VERTEBRAL BILAT MOD SED  11/24/2017   Social History   Socioeconomic History   Marital status: Married    Spouse name: Not on file   Number of children: Not on file   Years of education: Not on file   Highest education level: Not on file  Occupational History   Not on file  Tobacco Use   Smoking status: Former    Current packs/day: 0.00    Average packs/day: 0.3 packs/day for 10.0 years (2.5 ttl pk-yrs)    Types: Cigarettes    Start date: 08/01/1958    Quit date: 08/01/1968    Years since quitting: 56.0   Smokeless tobacco: Former  Substance and Sexual Activity   Alcohol use: Yes    Comment: social   Drug use: No   Sexual activity: Not Currently    Birth control/protection: Post-menopausal  Other Topics Concern   Not on file  Social History Narrative   Not on file   Social Drivers of Health   Tobacco Use: Medium Risk (06/14/2024)   Patient History    Smoking Tobacco Use: Former    Smokeless Tobacco Use: Former    Passive Exposure: Not on Actuary Strain: Not on file  Food Insecurity: No Food Insecurity (01/16/2024)   Epic  Worried About Programme Researcher, Broadcasting/film/video in the Last Year: Never true    Ran Out of Food in the Last Year: Never true  Transportation Needs: No Transportation Needs (01/16/2024)   Epic    Lack of Transportation (Medical): No    Lack of Transportation (Non-Medical): No  Physical Activity: Not on file  Stress: Not on file  Social Connections: Not on file  Depression (EYV7-0): Not on file  Alcohol Screen: Not on file  Housing: Low Risk (01/16/2024)   Epic    Unable to Pay for Housing in the Last Year: No    Number of Times Moved in the Last Year: 0    Homeless in the Last Year: No  Utilities: Not At Risk (01/16/2024)   Epic    Threatened with loss of utilities: No  Health Literacy: Not on file   Allergies[1] Family History  Problem Relation Age of Onset    Arthritis Other    Heart disease Other    Cancer Other        colon   Diabetes Other    Breast cancer Maternal Aunt     Current Outpatient Medications (Cardiovascular):    hydrochlorothiazide  (MICROZIDE ) 12.5 MG capsule, TAKE 1 CAPSULE BY MOUTH EVERY DAY  Current Outpatient Medications (Other):    calcium carbonate (OSCAL) 1500 (600 Ca) MG TABS tablet, Take by mouth daily. As directed   Cholecalciferol (VITAMIN D) 1000 UNITS capsule, Take 1,000 Units by mouth daily.   Multiple Vitamin (MULTIVITAMIN) tablet, Take 1 tablet by mouth daily.   Omega-3 Fatty Acids (FISH OIL) 1000 MG CAPS, Take by mouth.   triamcinolone  cream (KENALOG ) 0.1 %, Apply 1 application. topically 2 (two) times daily.   Reviewed prior external information including notes and imaging from  primary care provider As well as notes that were available from care everywhere and other healthcare systems.  Past medical history, social, surgical and family history all reviewed in electronic medical record.  No pertanent information unless stated regarding to the chief complaint.   Review of Systems:  No headache, visual changes, nausea, vomiting, diarrhea, constipation, dizziness, abdominal pain, skin rash, fevers, chills, night sweats, weight loss, swollen lymph nodes, body aches, joint swelling, chest pain, shortness of breath, mood changes. POSITIVE muscle aches  Objective  Blood pressure 126/70, pulse 97, height 5' 3 (1.6 m), SpO2 97%.   General: No apparent distress alert and oriented x3 mood and affect normal, dressed appropriately.  HEENT: Pupils equal, extraocular movements intact  Respiratory: Patient's speak in full sentences and does not appear short of breath  Cardiovascular: No lower extremity edema, non tender, no erythema  Antalgic gait noted.  Right knee trace effusion noted.  Crepitus noted.  Tender to palpation mostly over the medial joint space. Patient's right shoulder weakness noted of the rotator  cuff.  Tender to palpation in the paraspinal musculature as well.  Procedure: Real-time Ultrasound Guided Injection of right glenohumeral joint Device: GE Logiq Q7  Ultrasound guided injection is preferred based studies that show increased duration, increased effect, greater accuracy, decreased procedural pain, increased response rate with ultrasound guided versus blind injection.  Verbal informed consent obtained.  Time-out conducted.  Noted no overlying erythema, induration, or other signs of local infection.  Skin prepped in a sterile fashion.  Local anesthesia: Topical Ethyl chloride.  With sterile technique and under real time ultrasound guidance:  Joint visualized.  23g 1  inch needle inserted posterior approach. Pictures taken for needle placement. Patient did have injection  of 2 cc of 1% lidocaine , 2 cc of 0.5% Marcaine , and 1.0 cc of Kenalog  40 mg/dL. Completed without difficulty  Pain immediately resolved suggesting accurate placement of the medication.  Advised to call if fevers/chills, erythema, induration, drainage, or persistent bleeding.  Impression: Technically successful ultrasound guided injection.  After informed written and verbal consent, patient was seated on exam table. Right knee was prepped with alcohol swab and utilizing anterolateral approach, patient's right knee space was injected with 4:1  marcaine  0.5%: Kenalog  40mg /dL. Patient tolerated the procedure well without immediate complications.    Impression and Recommendations:    The above documentation has been reviewed and is accurate and complete Arthea CHRISTELLA Sharps, DO        [1]  Allergies Allergen Reactions   Amoxicillin Hives, Itching and Rash    Has patient had a PCN reaction causing immediate rash, facial/tongue/throat swelling, SOB or lightheadedness with hypotension: Yes Has patient had a PCN reaction causing severe rash involving mucus membranes or skin necrosis: No Has patient had a PCN reaction  that required hospitalization: No Has patient had a PCN reaction occurring within the last 10 years: No If all of the above answers are NO, then may proceed with Cephalosporin use.    Cephalexin Hives, Itching and Rash   Doxycycline  Rash   "

## 2024-08-23 ENCOUNTER — Encounter: Payer: Self-pay | Admitting: Family Medicine

## 2024-08-23 ENCOUNTER — Other Ambulatory Visit: Payer: Self-pay

## 2024-08-23 ENCOUNTER — Ambulatory Visit: Admitting: Family Medicine

## 2024-08-23 VITALS — BP 126/70 | HR 97 | Ht 63.0 in

## 2024-08-23 DIAGNOSIS — M25511 Pain in right shoulder: Secondary | ICD-10-CM | POA: Diagnosis not present

## 2024-08-23 DIAGNOSIS — M7551 Bursitis of right shoulder: Secondary | ICD-10-CM

## 2024-08-23 DIAGNOSIS — M1711 Unilateral primary osteoarthritis, right knee: Secondary | ICD-10-CM

## 2024-08-23 NOTE — Assessment & Plan Note (Signed)
 Worsening pain again.  Given injection.  Does have some rotator cuff arthropathy we will continue to monitor as well.  Increase activity slowly.  Follow-up again in 6 to 12 weeks.

## 2024-08-23 NOTE — Assessment & Plan Note (Signed)
 Patient does have arthritic changes noted.  Fairly severe overall.  Responding well though to the injection.  Discussed which activities to do injections to avoid.  Increase activity slowly.  Discussed icing regimen.  Follow-up with me again in 6 to 12 weeks.  Patient does not want any type of replacement with patient being 89 years of age

## 2024-08-23 NOTE — Patient Instructions (Signed)
 Injection in knee and shoulder Good to see you! See you again in 2-3 months

## 2024-11-04 ENCOUNTER — Ambulatory Visit: Admitting: Family Medicine

## 2025-01-13 ENCOUNTER — Other Ambulatory Visit (HOSPITAL_COMMUNITY)

## 2025-01-22 ENCOUNTER — Ambulatory Visit: Admitting: Urology
